# Patient Record
Sex: Female | Born: 1960 | Race: White | Hispanic: Yes | Marital: Single | Smoking: Never smoker
Health system: Southern US, Community
[De-identification: ages and names within clinical notes are randomized; demographics above are authoritative.]

## PROBLEM LIST (undated history)

## (undated) ENCOUNTER — Emergency Department (HOSPITAL_COMMUNITY): Disposition: A | Discharge status: Home / Self Care | Payer: Self-pay

## (undated) DIAGNOSIS — E042 Nontoxic multinodular goiter: Secondary | ICD-10-CM

## (undated) DIAGNOSIS — C9590 Leukemia, unspecified not having achieved remission: Secondary | ICD-10-CM

## (undated) DIAGNOSIS — E785 Hyperlipidemia, unspecified: Secondary | ICD-10-CM

## (undated) DIAGNOSIS — C9110 Chronic lymphocytic leukemia of B-cell type not having achieved remission: Secondary | ICD-10-CM

## (undated) DIAGNOSIS — I10 Essential (primary) hypertension: Secondary | ICD-10-CM

## (undated) HISTORY — DX: Nontoxic multinodular goiter: E04.2

## (undated) HISTORY — DX: Chronic lymphocytic leukemia of B-cell type not having achieved remission: C91.10

## (undated) HISTORY — DX: Hyperlipidemia, unspecified: E78.5

## (undated) HISTORY — DX: Essential (primary) hypertension: I10

---

## 2008-01-29 ENCOUNTER — Emergency Department (HOSPITAL_COMMUNITY): Admission: EM | Admit: 2008-01-29 | Discharge: 2008-01-29 | Payer: Self-pay | Admitting: Emergency Medicine

## 2010-09-09 ENCOUNTER — Inpatient Hospital Stay (HOSPITAL_COMMUNITY): Admission: EM | Admit: 2010-09-09 | Discharge: 2010-09-14 | Payer: Self-pay | Admitting: Emergency Medicine

## 2010-09-10 ENCOUNTER — Ambulatory Visit: Payer: Self-pay | Admitting: Hematology & Oncology

## 2010-09-12 ENCOUNTER — Encounter (INDEPENDENT_AMBULATORY_CARE_PROVIDER_SITE_OTHER): Payer: Self-pay | Admitting: Internal Medicine

## 2010-09-14 ENCOUNTER — Ambulatory Visit: Payer: Self-pay | Admitting: Oncology

## 2010-09-27 ENCOUNTER — Encounter: Payer: Self-pay | Admitting: Cardiology

## 2010-09-30 ENCOUNTER — Other Ambulatory Visit
Admission: RE | Admit: 2010-09-30 | Discharge: 2010-09-30 | Payer: Self-pay | Source: Home / Self Care | Admitting: Oncology

## 2010-09-30 LAB — MORPHOLOGY: PLT EST: ADEQUATE

## 2010-09-30 LAB — CBC WITH DIFFERENTIAL/PLATELET
Basophils Absolute: 0.1 10*3/uL (ref 0.0–0.1)
EOS%: 1 % (ref 0.0–7.0)
HCT: 35.9 % (ref 34.8–46.6)
HGB: 12.2 g/dL (ref 11.6–15.9)
MCH: 27.7 pg (ref 25.1–34.0)
MONO#: 0.4 10*3/uL (ref 0.1–0.9)
NEUT#: 3.7 10*3/uL (ref 1.5–6.5)
NEUT%: 24.7 % — ABNORMAL LOW (ref 38.4–76.8)
RDW: 13.6 % (ref 11.2–14.5)
WBC: 15.1 10*3/uL — ABNORMAL HIGH (ref 3.9–10.3)
lymph#: 10.7 10*3/uL — ABNORMAL HIGH (ref 0.9–3.3)

## 2010-09-30 LAB — COMPREHENSIVE METABOLIC PANEL
ALT: 10 U/L (ref 0–35)
Alkaline Phosphatase: 102 U/L (ref 39–117)
CO2: 25 mEq/L (ref 19–32)
Creatinine, Ser: 0.96 mg/dL (ref 0.40–1.20)
Glucose, Bld: 88 mg/dL (ref 70–99)
Sodium: 139 mEq/L (ref 135–145)
Total Bilirubin: 0.4 mg/dL (ref 0.3–1.2)

## 2010-09-30 LAB — CHCC SMEAR

## 2010-09-30 LAB — URIC ACID: Uric Acid, Serum: 6.6 mg/dL (ref 2.4–7.0)

## 2010-09-30 LAB — LACTATE DEHYDROGENASE: LDH: 215 U/L (ref 94–250)

## 2010-10-04 LAB — IMMUNOFIXATION ELECTROPHORESIS
IgA: 158 mg/dL (ref 68–378)
IgM, Serum: 21 mg/dL — ABNORMAL LOW (ref 60–263)
Total Protein, Serum Electrophoresis: 7.3 g/dL (ref 6.0–8.3)

## 2010-10-14 ENCOUNTER — Ambulatory Visit: Payer: Self-pay | Admitting: Oncology

## 2010-10-17 LAB — CBC WITH DIFFERENTIAL/PLATELET
BASO%: 0.3 % (ref 0.0–2.0)
EOS%: 0.9 % (ref 0.0–7.0)
MCH: 27.5 pg (ref 25.1–34.0)
MCHC: 33.7 g/dL (ref 31.5–36.0)
MCV: 81.5 fL (ref 79.5–101.0)
MONO%: 8.3 % (ref 0.0–14.0)
RBC: 4.61 10*6/uL (ref 3.70–5.45)
RDW: 13.2 % (ref 11.2–14.5)
lymph#: 12.5 10*3/uL — ABNORMAL HIGH (ref 0.9–3.3)

## 2010-10-30 HISTORY — PX: CARDIAC CATHETERIZATION: SHX172

## 2010-11-01 ENCOUNTER — Ambulatory Visit (HOSPITAL_COMMUNITY)
Admission: RE | Admit: 2010-11-01 | Discharge: 2010-11-01 | Payer: Self-pay | Source: Home / Self Care | Attending: Oncology | Admitting: Oncology

## 2010-11-01 LAB — CBC WITH DIFFERENTIAL/PLATELET
BASO%: 0.4 % (ref 0.0–2.0)
Basophils Absolute: 0 10*3/uL (ref 0.0–0.1)
EOS%: 1.6 % (ref 0.0–7.0)
Eosinophils Absolute: 0.2 10*3/uL (ref 0.0–0.5)
HCT: 36.5 % (ref 34.8–46.6)
HGB: 12.7 g/dL (ref 11.6–15.9)
LYMPH%: 60.4 % — ABNORMAL HIGH (ref 14.0–49.7)
MCH: 28.2 pg (ref 25.1–34.0)
MCHC: 34.8 g/dL (ref 31.5–36.0)
MCV: 81.2 fL (ref 79.5–101.0)
MONO#: 1.2 10*3/uL — ABNORMAL HIGH (ref 0.1–0.9)
MONO%: 10 % (ref 0.0–14.0)
NEUT#: 3.4 10*3/uL (ref 1.5–6.5)
NEUT%: 27.6 % — ABNORMAL LOW (ref 38.4–76.8)
Platelets: 196 10*3/uL (ref 145–400)
RBC: 4.5 10*6/uL (ref 3.70–5.45)
RDW: 13.6 % (ref 11.2–14.5)
WBC: 12.3 10*3/uL — ABNORMAL HIGH (ref 3.9–10.3)
lymph#: 7.4 10*3/uL — ABNORMAL HIGH (ref 0.9–3.3)

## 2010-11-01 LAB — COMPREHENSIVE METABOLIC PANEL
ALT: 17 U/L (ref 0–35)
AST: 22 U/L (ref 0–37)
Albumin: 3.6 g/dL (ref 3.5–5.2)
Alkaline Phosphatase: 101 U/L (ref 39–117)
BUN: 12 mg/dL (ref 6–23)
CO2: 27 mEq/L (ref 19–32)
Calcium: 8.8 mg/dL (ref 8.4–10.5)
Chloride: 105 mEq/L (ref 96–112)
Creatinine, Ser: 1.05 mg/dL (ref 0.40–1.20)
Glucose, Bld: 107 mg/dL — ABNORMAL HIGH (ref 70–99)
Potassium: 3.8 mEq/L (ref 3.5–5.3)
Sodium: 140 mEq/L (ref 135–145)
Total Bilirubin: 0.4 mg/dL (ref 0.3–1.2)
Total Protein: 7.4 g/dL (ref 6.0–8.3)

## 2010-11-01 LAB — LACTATE DEHYDROGENASE: LDH: 282 U/L — ABNORMAL HIGH (ref 94–250)

## 2010-11-11 ENCOUNTER — Telehealth (INDEPENDENT_AMBULATORY_CARE_PROVIDER_SITE_OTHER): Payer: Self-pay | Admitting: *Deleted

## 2010-11-23 ENCOUNTER — Ambulatory Visit
Admission: RE | Admit: 2010-11-23 | Discharge: 2010-11-23 | Payer: Self-pay | Source: Home / Self Care | Attending: Cardiology | Admitting: Cardiology

## 2010-11-23 ENCOUNTER — Encounter: Payer: Self-pay | Admitting: Cardiology

## 2010-11-23 DIAGNOSIS — E785 Hyperlipidemia, unspecified: Secondary | ICD-10-CM | POA: Insufficient documentation

## 2010-11-23 DIAGNOSIS — I5022 Chronic systolic (congestive) heart failure: Secondary | ICD-10-CM | POA: Insufficient documentation

## 2010-11-24 ENCOUNTER — Encounter: Payer: Self-pay | Admitting: Cardiology

## 2010-11-24 ENCOUNTER — Ambulatory Visit
Admission: RE | Admit: 2010-11-24 | Discharge: 2010-11-24 | Payer: Self-pay | Source: Home / Self Care | Attending: Cardiology | Admitting: Cardiology

## 2010-11-24 ENCOUNTER — Other Ambulatory Visit: Payer: Self-pay | Admitting: Cardiology

## 2010-11-24 LAB — HEPATIC FUNCTION PANEL
ALT: 19 U/L (ref 0–35)
AST: 21 U/L (ref 0–37)
Bilirubin, Direct: 0 mg/dL (ref 0.0–0.3)
Total Protein: 7.3 g/dL (ref 6.0–8.3)

## 2010-11-24 LAB — BRAIN NATRIURETIC PEPTIDE: Pro B Natriuretic peptide (BNP): 26.5 pg/mL (ref 0.0–100.0)

## 2010-11-24 LAB — BASIC METABOLIC PANEL
BUN: 12 mg/dL (ref 6–23)
Calcium: 9.1 mg/dL (ref 8.4–10.5)
GFR: 49.84 mL/min — ABNORMAL LOW (ref >60.00)
Glucose, Bld: 98 mg/dL (ref 70–99)
Potassium: 4.3 mEq/L (ref 3.5–5.1)

## 2010-11-24 LAB — LIPID PANEL
Cholesterol: 152 mg/dL (ref 0–200)
HDL: 37.6 mg/dL — ABNORMAL LOW (ref >39.00)
VLDL: 16.2 mg/dL (ref 0.0–40.0)

## 2010-11-25 LAB — CONVERTED CEMR LAB
Basophils Absolute: 0 10*3/uL (ref 0.0–0.1)
Basophils Relative: 0 % (ref 0–1)
Hemoglobin: 12.8 g/dL (ref 12.0–15.0)
Lymphocytes Relative: 67 % — ABNORMAL HIGH (ref 12–46)
MCHC: 32.5 g/dL (ref 30.0–36.0)
Neutro Abs: 3.1 10*3/uL (ref 1.7–7.7)
Neutrophils Relative %: 19 % — ABNORMAL LOW (ref 43–77)
Platelets: 232 10*3/uL (ref 150–400)
RDW: 13.7 % (ref 11.5–15.5)

## 2010-11-28 ENCOUNTER — Ambulatory Visit (HOSPITAL_COMMUNITY)
Admission: RE | Admit: 2010-11-28 | Discharge: 2010-11-28 | Payer: Self-pay | Source: Home / Self Care | Attending: Cardiology | Admitting: Cardiology

## 2010-11-28 LAB — CBC
HCT: 38.9 % (ref 36.0–46.0)
MCH: 27 pg (ref 26.0–34.0)
MCHC: 33.4 g/dL (ref 30.0–36.0)
MCV: 80.7 fL (ref 78.0–100.0)
Platelets: 187 10*3/uL (ref 150–400)
RDW: 13 % (ref 11.5–15.5)
WBC: 15.1 10*3/uL — ABNORMAL HIGH (ref 4.0–10.5)

## 2010-12-01 ENCOUNTER — Other Ambulatory Visit: Payer: Self-pay

## 2010-12-01 NOTE — Letter (Signed)
Summary: Cardiac Catheterization Instructions- Main Lab  [employer], Main Office  [address]   [place], [place] [address]   Phone: [phone]  Fax: [phone]     11/23/2010 MRN: [id number]  [facility] [address] [place], [place]  [address]  Dear Ms. Renshaw,   You are scheduled for Cardiac Catheterization on Monday January 30,2012              with Dr. Marca Ancona.  Please arrive at the [facility] at 11:30       a.m. on the day of your procedure.  1. DIET     __x__ Nothing to eat after midnight. You can have clear liquids until 7:30am, then nothing to eat or drink except your medications with a sip of water.   2. Come to the [place] office on Thursday January 26,2012            for lab work.  The lab at [facility] is open from 8:30 a.m. to 1:30 p.m. and 2:30 p.m. to 5:00 p.m. You should not have anything to eat or drink after midnight tonight.  3. MAKE SURE YOU TAKE YOUR ASPIRIN.  4. You may take all your medications.   5. Plan for one night stay - bring personal belongings (i.e. toothpaste, toothbrush, etc.)  6. Bring a current list of your medications and current insurance cards.  7. Must have a responsible person to drive you home.   8. Someone must be with you  for the first 24 hours after you arrive home.  9. Please wear clothes that are easy to get on and off and wear slip-on shoes.  *Special note: Every effort is made to have your procedure done on time.  Occasionally there are emergencies that present themselves at the hospital that may cause delays.  Please be patient if a delay does occur.  If you have any questions after you get home, please call the office at the number listed above.  Katina Dung, RN, BSN

## 2010-12-01 NOTE — Miscellaneous (Signed)
Summary: Orders Update  Clinical Lists Changes  Problems: Added new problem of PRE-OPERATIVE CARDIOVASCULAR EXAMINATION (ICD-V72.81) Orders: Added new Test order of TLB-Lipid Panel (80061-LIPID) - Signed Added new Test order of TLB-Hepatic/Liver Function Pnl (80076-HEPATIC) - Signed Added new Test order of TLB-BMP (Basic Metabolic Panel-BMET) (80048-METABOL) - Signed Added new Test order of TLB-CBC Platelet - w/Differential (85025-CBCD) - Signed Added new Test order of TLB-BNP (B-Natriuretic Peptide) (83880-BNPR) - Signed Added new Test order of TLB-PT (Protime) (85610-PTP) - Signed

## 2010-12-01 NOTE — Assessment & Plan Note (Signed)
Summary: continue  care self referral. Patient dissatisfied with Dr. Rexene Edison...   Visit Type:  Initial Consult Primary Provider:  [facility]  CC:  headache, dizziness, blurred vision, head feels heated, and sour taste in mouth.  History of Present Illness: 50 yo with HTN, CLL, and recently diagnosed cardiomyopathy presents to establish cardiology care.  Patient was hospitalized in 11/11 with "atypical pneumonia" and a headache.  She was found to have probable CLL and echo showed a diffusely hypokinetic left ventricle with EF abougt 25-30%.  HIV and ANA were negative.  She has been treated medically since that time with losartan, Coreg, and spironolactone.  She says that her mother "dropped dead" at age 5.  She is a non-smoker.   Patient reports dyspnea walking up stairs or carrying groceries for over a year.  Probably some orthopnea (sleeps on 3 pillows).  No PND, no history of chest pain.  No syncope or lightheadedness.  No tachypalpitations.    ECG: NSR, LVH, deep anterolateral T wave inversions (repolarization abnormality versus ischemia)  Labs (11/11): HIV negative, ANA negative, K 3.9, creatinine 1.12, BNP 135  Preventive Screening-Counseling & Management  Alcohol-Tobacco     Smoking Status: never  Caffeine-Diet-Exercise     Does Patient Exercise: no  Current Medications (verified): 1)  Aldactone 25 Mg Tabs (Spironolactone) .... Take 1 Tablet By Mouth Two Times A Day 2)  Simvastatin 20 Mg Tabs (Simvastatin) .Marland Kitchen.. 1 Tablet Every Pm 3)  Carvedilol 6.25 Mg Tabs (Carvedilol) .Marland Kitchen.. 1 Tablet in The Am and 1 Tablet At Bedtime 4)  Aspirin 81 Mg Tbec (Aspirin) .... Take 1 Tablet By Mouth Once A Day 5)  Levothroid 25 Mcg Tabs (Levothyroxine Sodium) .Marland Kitchen.. 1 Tablet Before Breakfast 6)  Cozaar 50 Mg Tabs (Losartan Potassium) .... Take 1 Tablet By Mouth Once A Day  Allergies (verified): No Known Drug Allergies  Past History:  Past Medical History: 1. Chronic lymphocytic leukemia:  followed by Dr. Arline Asp 2. Hyperlipidemia 3. Hypertension  4. Multinodular goiter with hypothyroidism 5. Cardiomyopathy: Echo (11/11) EF 25-30% with diffuse hypokinesis, mild left atrial enlargement.  11/11 HIV and ANA negative.   Family History: Father: Deceased 27 Mother: says she died of MI.  Sounds like sudden cardiac death at age 33  Social History: From [place] lives in [place].  Married.  Speaks minimal English.  Works in [employer].  Tobacco Use - No.  Alcohol Use - no Regular Exercise - no Smoking Status:  never Does Patient Exercise:  no  Review of Systems       All systems reviewed and negative except as per HPI.   Vital Signs:  Patient profile:   50 year old female Height:      61 inches Weight:      191 pounds BMI:     36.22 Pulse rate:   69 / minute BP sitting:   124 / 80  (left arm)  Vitals Entered By: Celestia Khat, CMA (November 23, 2010 4:37 PM)  Physical Exam  General:  Well developed, well nourished, in no acute distress. Head:  normocephalic and atraumatic Nose:  no deformity, discharge, inflammation, or lesions Mouth:  Teeth, gums and palate normal. Oral mucosa normal. Neck:  Neck supple, no JVD. No masses, thyromegaly or abnormal cervical nodes. Lungs:  Clear bilaterally to auscultation and percussion. Heart:  Non-displaced PMI, chest non-tender; regular rate and rhythm, S1, S2 without rubs or gallops. 1/6 SEM RUSB.  Carotid upstroke normal, no bruit.  Pedals normal pulses.  Trace ankle edema.  Abdomen:  Bowel sounds positive; abdomen soft and non-tender without masses, organomegaly, or hernias noted. No hepatosplenomegaly. Msk:  Back normal, normal gait. Muscle strength and tone normal. Extremities:  No clubbing or cyanosis. Neurologic:  Alert and oriented x 3. Skin:  Intact without lesions or rashes. Psych:  Normal affect.   Impression & Recommendations:  Problem # 1:  CHRONIC SYSTOLIC HEART FAILURE (ICD-428.22) Cardiomyopathy of  uncertain etiology with EF 25-30%.  HIV and ANA negative.  It sounds like the patient's mother had sudden cardiac death at age 18.  Differential diagnosis includes CAD, familial cardiomyopathy, viral myocarditis.  Less likely infitrative disease like sarcoidosis.  - LHC to rule out coronary disease (HTN, hyperlipidemia, mother with SCD at age 87).  - SPEP/UPEP, Fe, TIBC, ferritin, BNP - If cath unrevealing, will consider cardiac MRI.  - Continue Coreg and spironolactone.  Increase losartan to 50 mg two times a day with BMET in a week.   Problem # 2:  HYPERLIPIDEMIA-MIXED (ICD-272.4) Check lipids/LFTs.   Other Orders: Cardiac Catheterization (Cardiac Cath)  Patient Instructions: 1)  Your physician has recommended you make the following change in your medication:  2)  Increase Losartan to 50mg  twice a day. 3)  Your physician recommends that you return for lab work tomorrow, Thursday January 26,2012---BMP/BNP/CBC/PT/Lipid/Liver--428.22--you should be fasting. Please come after 9AM in the morning. 4)  Your physician recommends that you schedule a follow-up appointment on Friday February  24,2012  at 4:15pm   with Dr Shirlee Latch in the [place] office. She should have a [place] interpreter.  5)  [address] [facility] [address])  [address] 7)  [place] 8)  [phone] 9)  Your physician has requested that you have a cardiac catheterization.  Cardiac catheterization is used to diagnose and/or treat various heart conditions. Doctors may recommend this procedure for a number of different reasons. The most common reason is to evaluate chest pain. Chest pain can be a symptom of coronary artery disease (CAD), and cardiac catheterization can show whether plaque is narrowing or blocking your heart's arteries. This procedure is also used to evaluate the valves, as well as measure the blood flow and oxygen levels in different parts of your heart.  For further information please visit [email or web address]  Please  follow instruction sheet, as given. Monday January 30,2011 Prescriptions: LOSARTAN POTASSIUM 50 MG TABS (LOSARTAN POTASSIUM) one twice a day  #60 x 6   Entered by:   Katina Dung, RN, BSN   Authorized by:   Marca Ancona, MD   Signed by:   Katina Dung, RN, BSN on 11/23/2010   Method used:   Print then Give to Patient   RxID:   [id number]

## 2010-12-01 NOTE — Progress Notes (Signed)
  ROI faxed to Dr.Harwani's Office to received records for Np appt with Katherine Terrell on 11/23/10 [facility]  November 11, 2010 9:08 AM     Appended Document:  Records Received from [facility] Office, gave to [place]

## 2010-12-02 ENCOUNTER — Encounter: Payer: Self-pay | Admitting: Cardiology

## 2010-12-03 ENCOUNTER — Telehealth: Payer: Self-pay | Admitting: Physician Assistant

## 2010-12-06 ENCOUNTER — Telehealth: Payer: Self-pay | Admitting: Cardiology

## 2010-12-06 ENCOUNTER — Encounter: Payer: Self-pay | Admitting: Cardiology

## 2010-12-07 NOTE — Letter (Signed)
Summary: Custom - Lipid  [facility], Main Office  [address]   [place], [place] [address]   Phone: [phone]  Fax: [phone]     December 02, 2010 MRN: [id number]   [facility] [address] [place], [place]  [address]   Dear Ms. Warnke,  Dr Shirlee Latch has  reviewed your cholesterol results.  They are as follows:     Total Cholesterol:    152 (Desirable: less than 200)       HDL  Cholesterol:     37.60  (Desirable: greater than 40 for men and 50 for women)       LDL Cholesterol:       98  (Desirable: less than 100 for low risk and less than 70 for moderate to high risk)       Triglycerides:       81.0  (Desirable: less than 150)  His recommendations include: no new recommendations. Your cholesterol is OK.   Call our office at the number listed above if you have any questions.  Lowering your LDL cholesterol is important, but it is only one of a large number of "risk factors" that may indicate that you are at risk for heart disease, stroke or other complications of hardening of the arteries.  Other risk factors include:   A.  Cigarette Smoking* B.  High Blood Pressure* C.  Obesity* D.   Low HDL Cholesterol (see yours above)* E.   Diabetes Mellitus (higher risk if your is uncontrolled) F.  Family history of premature heart disease G.  Previous history of stroke or cardiovascular disease    *These are risk factors YOU HAVE CONTROL OVER.  For more information, visit .  There is now evidence that lowering the TOTAL CHOLESTEROL AND LDL CHOLESTEROL can reduce the risk of heart disease.  The American Heart Association recommends the following guidelines for the treatment of elevated cholesterol:  1.  If there is now current heart disease and less than two risk factors, TOTAL CHOLESTEROL should be less than 200 and LDL CHOLESTEROL should be less than 100. 2.  If there is current heart disease or two or more risk factors, TOTAL CHOLESTEROL should be less  than 200 and LDL CHOLESTEROL should be less than 70.  A diet low in cholesterol, saturated fat, and calories is the cornerstone of treatment for elevated cholesterol.  Cessation of smoking and exercise are also important in the management of elevated cholesterol and preventing vascular disease.  Studies have shown that 30 to 60 minutes of physical activity most days can help lower blood pressure, lower cholesterol, and keep your weight at a healthy level.  Drug therapy is used when cholesterol levels do not respond to therapeutic lifestyle changes (smoking cessation, diet, and exercise) and remains unacceptably high.  If medication is started, it is important to have you levels checked periodically to evaluate the need for further treatment options.  Thank you,    Luana Shu

## 2010-12-07 NOTE — Progress Notes (Addendum)
  Phone Note Call from Patient   Summary of Call: pt's son called this AM (pt does not speak [place]). he reports that she is c/o leg pain and fever. there is no obvious bleeding. she had a cardiac cath on 1/30.   I advised pt's son to take her to nearest walkin clinic, or ED, for further evaluation (they live in [place]). he was agreeable with this recommendation. Initial call taken by: Nelida Meuse, PA-C,  December 03, 2010 1:28 PM

## 2010-12-08 NOTE — Procedures (Signed)
  NAMESHIVALI, Katherine Terrell               ACCOUNT NO.:  [id number]  MEDICAL RECORD NO.:  [id number]          PATIENT TYPE:  OIB  LOCATION:  2899                         FACILITY:  [facility]  PHYSICIAN:  Marca Ancona, MD      DATE OF BIRTH:  August 16, 1961  DATE OF PROCEDURE:  11/28/2010 DATE OF DISCHARGE:  11/28/2010                           CARDIAC CATHETERIZATION   PROCEDURES: 1. Left heart catheterization. 2. Coronary angiography. 3. Left ventriculography.  OPERATOR:  Marca Ancona, MD  INDICATIONS:  This is a 50 year old who was found to have a cardiomyopathy with an EF of 25-30% on echocardiogram in November. Cardiomyopathy was of uncertain etiology.  PROCEDURE NOTE:  After informed consent was obtained, the patient underwent Allen testing on her right wrist.  The Allen test was negative suggesting poor collateral circulation from the ulnar artery to the radial side of the hand, therefore we elected to proceed using femoral artery access.  The right coronary was sterilely prepped and draped. Lidocaine 1% was used locally to anesthetize the right groin area.  The right common femoral artery was accessed using modified Seldinger technique and a 5-French arterial sheath was placed.  The left coronary artery and the right coronary artery were engaged using the multipurpose catheter.  The left ventricle was entered using the multipurpose catheter.  There were no complications.  FINDINGS: 1. Hemodynamics:  LV 138/2, aorta 142/73. 2. Left ventriculography:  EF was estimated to be 50-55%. 3. Right coronary artery:  The right coronary artery was a dominant     vessel with no angiographic coronary artery disease. 4. Left main:  Left main was short vessel with no angiographic     coronary artery disease. 5. Left circumflex system:  The left circumflex system had a first     obtuse marginal, small second obtuse marginal, and third obtuse     marginal.  There was no angiographic disease. 6.  LAD system:  The LAD had two moderate-sized diagonals followed by     some smaller diagonals more distally.  There was no angiographic     disease.  IMPRESSION:  This is a 50 year old with cardiomyopathy of uncertain etiology who was found to have normal coronaries on left heart catheterization.  Her ejection fraction actually appears improved compared to November.  I am going to go ahead and repeat an echocardiogram in 3-4 weeks to confirm this.     Marca Ancona, MD     DM/MEDQ  D:  11/28/2010  T:  11/29/2010  Job:  [id number]  cc:   [facility]  Electronically Signed by Marca Ancona MD on 12/08/2010 08:38:35 AM

## 2010-12-15 NOTE — Letter (Signed)
Summary: Return To Work  [employer], Main Office  [address]   [place], [place] [address]   Phone: [phone]  Fax: [phone]    12/06/2010  TO: Leodis Sias IT MAY CONCERN   RE: WILLOW SHIDLER [address] [place]   The above named individual is under my medical care and may return to work on: February 14,2012 without restrictions.  If you have any further questions or need additional information, please call.     Sincerely,      Dalton McLean,MD

## 2010-12-15 NOTE — Progress Notes (Signed)
Summary: Need letter to return to work  Phone Note Call from Patient Call back at [employer] [phone]   Caller: Patient Reason for Call: Talk to Nurse Summary of Call: pt states her employer is asking for a new letter from the doctor to go back to work on 12/13/10. pt states she needs the letter to state she can go back to work and pt has no weight limit to carry. pt would like the letter to be mail to her home.  pt does not speak english. pt would like to know when the letter will be sent to her. Initial call taken by: Judie Grieve,  December 06, 2010 11:10 AM  Follow-up for Phone Call        pt notified through interpreter # [phone] that I would mail letter to return to work to her home address

## 2010-12-20 ENCOUNTER — Other Ambulatory Visit (INDEPENDENT_AMBULATORY_CARE_PROVIDER_SITE_OTHER): Payer: Self-pay

## 2010-12-20 ENCOUNTER — Other Ambulatory Visit: Payer: Self-pay | Admitting: Cardiology

## 2010-12-20 DIAGNOSIS — I059 Rheumatic mitral valve disease, unspecified: Secondary | ICD-10-CM

## 2010-12-23 ENCOUNTER — Ambulatory Visit (INDEPENDENT_AMBULATORY_CARE_PROVIDER_SITE_OTHER): Payer: Self-pay | Admitting: Cardiology

## 2010-12-23 ENCOUNTER — Encounter: Payer: Self-pay | Admitting: Cardiology

## 2010-12-23 DIAGNOSIS — I5022 Chronic systolic (congestive) heart failure: Secondary | ICD-10-CM

## 2010-12-23 DIAGNOSIS — E039 Hypothyroidism, unspecified: Secondary | ICD-10-CM | POA: Insufficient documentation

## 2010-12-26 LAB — CONVERTED CEMR LAB: TSH: 2.928 microintl units/mL (ref 0.350–4.500)

## 2011-01-04 ENCOUNTER — Encounter (HOSPITAL_BASED_OUTPATIENT_CLINIC_OR_DEPARTMENT_OTHER): Payer: Self-pay | Admitting: Oncology

## 2011-01-04 ENCOUNTER — Other Ambulatory Visit (HOSPITAL_COMMUNITY): Payer: Self-pay | Admitting: Oncology

## 2011-01-04 DIAGNOSIS — Z23 Encounter for immunization: Secondary | ICD-10-CM

## 2011-01-04 DIAGNOSIS — C9110 Chronic lymphocytic leukemia of B-cell type not having achieved remission: Secondary | ICD-10-CM

## 2011-01-04 DIAGNOSIS — D72829 Elevated white blood cell count, unspecified: Secondary | ICD-10-CM

## 2011-01-04 DIAGNOSIS — Z1231 Encounter for screening mammogram for malignant neoplasm of breast: Secondary | ICD-10-CM

## 2011-01-04 LAB — COMPREHENSIVE METABOLIC PANEL
ALT: 16 U/L (ref 0–35)
AST: 18 U/L (ref 0–37)
Calcium: 9 mg/dL (ref 8.4–10.5)
Chloride: 105 mEq/L (ref 96–112)
Creatinine, Ser: 1.08 mg/dL (ref 0.40–1.20)
Sodium: 139 mEq/L (ref 135–145)

## 2011-01-04 LAB — CBC WITH DIFFERENTIAL/PLATELET
BASO%: 0.4 % (ref 0.0–2.0)
LYMPH%: 69.9 % — ABNORMAL HIGH (ref 14.0–49.7)
MCHC: 34 g/dL (ref 31.5–36.0)
MONO#: 1 10*3/uL — ABNORMAL HIGH (ref 0.1–0.9)
Platelets: 182 10*3/uL (ref 145–400)
RBC: 4.36 10*6/uL (ref 3.70–5.45)
RDW: 14.5 % (ref 11.2–14.5)
WBC: 17.4 10*3/uL — ABNORMAL HIGH (ref 3.9–10.3)
lymph#: 12.2 10*3/uL — ABNORMAL HIGH (ref 0.9–3.3)

## 2011-01-04 LAB — LACTATE DEHYDROGENASE: LDH: 188 U/L (ref 94–250)

## 2011-01-05 NOTE — Assessment & Plan Note (Signed)
Summary: S/P CATH/ /PT SPEAKS SPANISH/WILL HAVE INTERPERTER/SET UP WIT.Marland KitchenMarland Kitchen   Visit Type:  Follow-up Primary Provider:  [facility]  CC:  F/U [facility] s/p cardiac cath..  History of Present Illness: 50 yo with HTN, CLL, and recently diagnosed cardiomyopathy presents for followup.  Patient was hospitalized in 11/11 with "atypical pneumonia" and a headache.  She was found to have probable CLL and echo showed a diffusely hypokinetic left ventricle with EF abougt 25-30%.  HIV, TSH, and ANA were negative.  She has been treated medically since that time with losartan, Coreg, and spironolactone. She says that her mother "dropped dead" at age 88.  She is a non-smoker.   I did a left heart cath in 1/12 to assess for coronary disease.  This showed no angiographic CAD. Left ventriculogram with cath and echo in 2/12 showed that EF had improved to 50-55%.  Minimal dyspnea now.  She able to climb steps and do housework with no problem.  No PND, no history of chest pain.  No syncope or lightheadedness.  No tachypalpitations.    ECG: NSR, diffuse anterior/lateral/inferior T wave inversions  Labs (11/11): HIV negative, ANA negative, K 3.9, creatinine 1.12, BNP 135 Labs (1/12): LDL 98, HDL 38, BNP 27, K 4.3, creatinine 1.2 Labs (2/12): TSH normal  Current Medications (verified): 1)  Aldactone 25 Mg Tabs (Spironolactone) .... Take 1 Tablet By Mouth Two Times A Day 2)  Simvastatin 20 Mg Tabs (Simvastatin) .Marland Kitchen.. 1 Tablet Every Pm 3)  Carvedilol 6.25 Mg Tabs (Carvedilol) .Marland Kitchen.. 1 Tablet in The Am and 1 Tablet At Bedtime 4)  Levothroid 25 Mcg Tabs (Levothyroxine Sodium) .Marland Kitchen.. 1 Tablet Before Breakfast 5)  Losartan Potassium 50 Mg Tabs (Losartan Potassium) .... One Twice A Day  Allergies (verified): No Known Drug Allergies  Past History:  Family History: Last updated: 09-Dec-2010 Father: Deceased 62 Mother: says she died of MI.  Sounds like sudden cardiac death at age 81  Social History: Last  updated: 12/09/10 From [place] lives in [place].  Married.  Speaks minimal English.  Works in [employer].  Tobacco Use - No.  Alcohol Use - no Regular Exercise - no  Risk Factors: Exercise: no (12-09-2010)  Risk Factors: Smoking Status: never (Dec 09, 2010)  Past Medical History: 1. Chronic lymphocytic leukemia: followed by Dr. Arline Asp 2. Hyperlipidemia 3. Hypertension  4. Multinodular goiter with hypothyroidism 5. Cardiomyopathy: Echo (11/11) EF 25-30% with diffuse hypokinesis, mild left atrial enlargement.  11/11 HIV and ANA negative.  TSH normal.  LHC (1/12): EF 50-55%, no angiographic CAD.  Echo (2/12): EF 50-55%, moderate LV hypertrophy, grade I diastolic dysfunction, mild MR, normal RV.   Past Surgical History: cardiac cath @ [facility] 2012  Family History: Reviewed history from 12-09-2010 and no changes required. Father: Deceased 75 Mother: says she died of MI.  Sounds like sudden cardiac death at age 66  Social History: Reviewed history from Dec 09, 2010 and no changes required. From [place] lives in [place].  Married.  Speaks minimal English.  Works in [employer].  Tobacco Use - No.  Alcohol Use - no Regular Exercise - no  Vital Signs:  Patient profile:   50 year old female Height:      61 inches Weight:      189 pounds BMI:     35.84 Pulse rate:   68 / minute BP sitting:   120 / 72  (left arm) Cuff size:   regular  Vitals Entered By: Bishop Dublin, CMA (December 23, 2010  4:18 PM)  Physical Exam  General:  Well developed, well nourished, in no acute distress.  Obese.  Neck:  Neck supple, no JVD. No masses, thyromegaly or abnormal cervical nodes. Lungs:  Clear bilaterally to auscultation and percussion. Heart:  Non-displaced PMI, chest non-tender; regular rate and rhythm, S1, S2 without rubs or gallops. 1/6 SEM RUSB.  Carotid upstroke normal, no bruit.  Pedals normal pulses. Trace ankle edema.  Abdomen:  Bowel sounds positive; abdomen soft and  non-tender without masses, organomegaly, or hernias noted. No hepatosplenomegaly. Extremities:  No clubbing or cyanosis. Neurologic:  Alert and oriented x 3. Psych:  Normal affect.   Impression & Recommendations:  Problem # 1:  CHRONIC SYSTOLIC HEART FAILURE (ICD-428.22) No angiographic CAD and close to normal EF with repeat echo and LV-gram.  This presentation may be consistent with an episode of myocarditis with recovery of LV function.  Would continue current meds for now.  Will need BMET in 2 months and followup in the office in 6 months to reassess.   Other Orders: T-TSH [phone])  Patient Instructions: 1)  Your physician recommends that you schedule a follow-up appointment in: 6 MONTHS 2)  Your physician recommends that you return for lab work in: 2 MONTHS (BMP) 3)  Your physician recommends that you continue on your current medications as directed. Please refer to the Current Medication list given to you today.

## 2011-01-05 NOTE — Progress Notes (Signed)
Summary: [facility] Progress Note   [facility] Progress Note   Imported By: Roderic Ovens 12/30/2010 09:41:26  _____________________________________________________________________  External Attachment:    Type:   Image     Comment:   External Document

## 2011-01-10 ENCOUNTER — Ambulatory Visit (HOSPITAL_COMMUNITY)
Admission: RE | Admit: 2011-01-10 | Discharge: 2011-01-10 | Disposition: A | Discharge status: Home / Self Care | Payer: Self-pay | Source: Ambulatory Visit | Attending: Oncology | Admitting: Oncology

## 2011-01-10 DIAGNOSIS — Z1231 Encounter for screening mammogram for malignant neoplasm of breast: Secondary | ICD-10-CM | POA: Insufficient documentation

## 2011-01-10 LAB — BASIC METABOLIC PANEL
BUN: 11 mg/dL (ref 6–23)
BUN: 9 mg/dL (ref 6–23)
CO2: 26 mEq/L (ref 19–32)
CO2: 27 mEq/L (ref 19–32)
Calcium: 9.2 mg/dL (ref 8.4–10.5)
Chloride: 101 mEq/L (ref 96–112)
Chloride: 105 mEq/L (ref 96–112)
Chloride: 107 mEq/L (ref 96–112)
Creatinine, Ser: 0.85 mg/dL (ref 0.4–1.2)
Creatinine, Ser: 0.97 mg/dL (ref 0.4–1.2)
Creatinine, Ser: 1.12 mg/dL (ref 0.4–1.2)
GFR calc Af Amer: >60 mL/min (ref >60)
GFR calc non Af Amer: 52 mL/min — ABNORMAL LOW (ref >60)
Glucose, Bld: 108 mg/dL — ABNORMAL HIGH (ref 70–99)
Glucose, Bld: 94 mg/dL (ref 70–99)
Potassium: 3.9 mEq/L (ref 3.5–5.1)

## 2011-01-10 LAB — BRAIN NATRIURETIC PEPTIDE: Pro B Natriuretic peptide (BNP): 135 pg/mL — ABNORMAL HIGH (ref 0.0–100.0)

## 2011-01-10 LAB — DIFFERENTIAL
Basophils Absolute: 0 10*3/uL (ref 0.0–0.1)
Basophils Absolute: 0 10*3/uL (ref 0.0–0.1)
Basophils Relative: 0 % (ref 0–1)
Eosinophils Absolute: 0.2 10*3/uL (ref 0.0–0.7)
Eosinophils Relative: 1 % (ref 0–5)
Lymphocytes Relative: 71 % — ABNORMAL HIGH (ref 12–46)
Lymphocytes Relative: 76 % — ABNORMAL HIGH (ref 12–46)
Lymphs Abs: 9.4 10*3/uL — ABNORMAL HIGH (ref 0.7–4.0)
Monocytes Absolute: 1.1 10*3/uL — ABNORMAL HIGH (ref 0.1–1.0)
Monocytes Relative: 7 % (ref 3–12)
Monocytes Relative: 8 % (ref 3–12)
Neutro Abs: 3.3 10*3/uL (ref 1.7–7.7)
Neutrophils Relative %: 17 % — ABNORMAL LOW (ref 43–77)
Neutrophils Relative %: 21 % — ABNORMAL LOW (ref 43–77)

## 2011-01-10 LAB — RENAL FUNCTION PANEL
Calcium: 8.6 mg/dL (ref 8.4–10.5)
GFR calc Af Amer: >60 mL/min (ref >60)
GFR calc non Af Amer: >60 mL/min (ref >60)
Phosphorus: 3.4 mg/dL (ref 2.3–4.6)
Sodium: 140 mEq/L (ref 135–145)

## 2011-01-10 LAB — CBC
HCT: 35.1 % — ABNORMAL LOW (ref 36.0–46.0)
HCT: 35.5 % — ABNORMAL LOW (ref 36.0–46.0)
HCT: 37.8 % (ref 36.0–46.0)
Hemoglobin: 11.8 g/dL — ABNORMAL LOW (ref 12.0–15.0)
MCH: 26.4 pg (ref 26.0–34.0)
MCH: 26.6 pg (ref 26.0–34.0)
MCH: 27 pg (ref 26.0–34.0)
MCH: 27.2 pg (ref 26.0–34.0)
MCH: 27.6 pg (ref 26.0–34.0)
MCHC: 33.1 g/dL (ref 30.0–36.0)
MCHC: 33.2 g/dL (ref 30.0–36.0)
MCHC: 34 g/dL (ref 30.0–36.0)
MCHC: 34.4 g/dL (ref 30.0–36.0)
MCV: 79.2 fL (ref 78.0–100.0)
MCV: 79.6 fL (ref 78.0–100.0)
MCV: 79.8 fL (ref 78.0–100.0)
MCV: 80.1 fL (ref 78.0–100.0)
MCV: 80.7 fL (ref 78.0–100.0)
Platelets: 181 10*3/uL (ref 150–400)
Platelets: 184 10*3/uL (ref 150–400)
Platelets: 184 10*3/uL (ref 150–400)
Platelets: 195 10*3/uL (ref 150–400)
Platelets: 195 10*3/uL (ref 150–400)
RBC: 4.35 MIL/uL (ref 3.87–5.11)
RDW: 13.5 % (ref 11.5–15.5)
RDW: 13.9 % (ref 11.5–15.5)
WBC: 15.7 10*3/uL — ABNORMAL HIGH (ref 4.0–10.5)
WBC: 16.9 10*3/uL — ABNORMAL HIGH (ref 4.0–10.5)

## 2011-01-10 LAB — POCT I-STAT, CHEM 8
Glucose, Bld: 104 mg/dL — ABNORMAL HIGH (ref 70–99)
HCT: 40 % (ref 36.0–46.0)
Hemoglobin: 13.6 g/dL (ref 12.0–15.0)
Potassium: 3.5 mEq/L (ref 3.5–5.1)
Sodium: 142 mEq/L (ref 135–145)

## 2011-01-10 LAB — URINE MICROSCOPIC-ADD ON

## 2011-01-10 LAB — CULTURE, BLOOD (ROUTINE X 2)
Culture  Setup Time: 201111120521
Culture: NO GROWTH

## 2011-01-10 LAB — PROTEIN ELECTROPH W RFLX QUANT IMMUNOGLOBULINS
Alpha-1-Globulin: 4.1 % (ref 2.9–4.9)
Alpha-2-Globulin: 10.5 % (ref 7.1–11.8)
Beta 2: 4.5 % (ref 3.2–6.5)
Beta Globulin: 5.7 % (ref 4.7–7.2)
Gamma Globulin: 17.3 % (ref 11.1–18.8)

## 2011-01-10 LAB — PATHOLOGIST SMEAR REVIEW

## 2011-01-10 LAB — URINALYSIS, ROUTINE W REFLEX MICROSCOPIC
Glucose, UA: NEGATIVE mg/dL
Ketones, ur: NEGATIVE mg/dL
Leukocytes, UA: NEGATIVE
pH: 6.5 (ref 5.0–8.0)

## 2011-01-10 LAB — HIV ANTIBODY (ROUTINE TESTING W REFLEX): HIV: NONREACTIVE

## 2011-01-10 LAB — COMPREHENSIVE METABOLIC PANEL
BUN: 13 mg/dL (ref 6–23)
Calcium: 8.7 mg/dL (ref 8.4–10.5)
Glucose, Bld: 105 mg/dL — ABNORMAL HIGH (ref 70–99)
Sodium: 140 mEq/L (ref 135–145)
Total Protein: 6.6 g/dL (ref 6.0–8.3)

## 2011-01-10 LAB — DIRECT ANTIGLOBULIN TEST (NOT AT ARMC): DAT, complement: NEGATIVE

## 2011-01-10 LAB — C-REACTIVE PROTEIN: CRP: 0.1 mg/dL — ABNORMAL LOW (ref <0.6)

## 2011-01-10 LAB — SEDIMENTATION RATE: Sed Rate: 19 mm/hr (ref 0–22)

## 2011-02-24 ENCOUNTER — Other Ambulatory Visit (INDEPENDENT_AMBULATORY_CARE_PROVIDER_SITE_OTHER): Payer: Self-pay | Admitting: *Deleted

## 2011-02-24 DIAGNOSIS — Z79899 Other long term (current) drug therapy: Secondary | ICD-10-CM

## 2011-02-24 DIAGNOSIS — I509 Heart failure, unspecified: Secondary | ICD-10-CM

## 2011-02-24 LAB — BASIC METABOLIC PANEL
BUN: 22 mg/dL (ref 6–23)
Chloride: 104 mEq/L (ref 96–112)
Creat: 1.05 mg/dL (ref 0.40–1.20)

## 2011-03-06 ENCOUNTER — Encounter (HOSPITAL_BASED_OUTPATIENT_CLINIC_OR_DEPARTMENT_OTHER): Payer: Self-pay | Admitting: Oncology

## 2011-03-06 ENCOUNTER — Other Ambulatory Visit (HOSPITAL_COMMUNITY): Payer: Self-pay | Admitting: Oncology

## 2011-03-06 DIAGNOSIS — C9110 Chronic lymphocytic leukemia of B-cell type not having achieved remission: Secondary | ICD-10-CM

## 2011-03-06 DIAGNOSIS — D72829 Elevated white blood cell count, unspecified: Secondary | ICD-10-CM

## 2011-03-06 LAB — CBC WITH DIFFERENTIAL/PLATELET
HCT: 36.2 % (ref 34.8–46.6)
HGB: 12.3 g/dL (ref 11.6–15.9)
RDW: 13.1 % (ref 11.2–14.5)
WBC: 25.9 10*3/uL — ABNORMAL HIGH (ref 3.9–10.3)

## 2011-03-06 LAB — MANUAL DIFFERENTIAL
ALC: 21.7 10*3/uL — ABNORMAL HIGH (ref 0.9–3.3)
ANC (CHCC manual diff): 3.1 10*3/uL (ref 1.5–6.5)
Band Neutrophils: 0 % (ref 0–10)
Basophil: 0 % (ref 0–2)
Blasts: 0 % (ref 0–0)
LYMPH: 84 % — ABNORMAL HIGH (ref 14–49)
Other Cell: 0 % (ref 0–0)
PLT EST: ADEQUATE
PROMYELO: 0 % (ref 0–0)
RBC Comments: NORMAL
SEG: 12 % — ABNORMAL LOW (ref 38–77)
Variant Lymph: 0 % (ref 0–0)
nRBC: 0 % (ref 0–0)

## 2011-03-06 LAB — COMPREHENSIVE METABOLIC PANEL
ALT: 19 U/L (ref 0–35)
CO2: 26 mEq/L (ref 19–32)
Calcium: 9.1 mg/dL (ref 8.4–10.5)
Chloride: 105 mEq/L (ref 96–112)
Creatinine, Ser: 1.08 mg/dL (ref 0.40–1.20)
Glucose, Bld: 102 mg/dL — ABNORMAL HIGH (ref 70–99)

## 2011-03-06 LAB — LACTATE DEHYDROGENASE: LDH: 333 U/L — ABNORMAL HIGH (ref 94–250)

## 2011-03-07 LAB — IGG, IGA, IGM
IgG (Immunoglobin G), Serum: 1390 mg/dL (ref 700–1600)
IgM, Serum: 17 mg/dL — ABNORMAL LOW (ref 60–263)

## 2011-04-06 ENCOUNTER — Other Ambulatory Visit (HOSPITAL_COMMUNITY): Payer: Self-pay | Admitting: Oncology

## 2011-04-06 ENCOUNTER — Encounter (HOSPITAL_BASED_OUTPATIENT_CLINIC_OR_DEPARTMENT_OTHER): Payer: Self-pay | Admitting: Oncology

## 2011-04-06 DIAGNOSIS — C9110 Chronic lymphocytic leukemia of B-cell type not having achieved remission: Secondary | ICD-10-CM

## 2011-04-06 LAB — CBC WITH DIFFERENTIAL/PLATELET
BASO%: 0.2 % (ref 0.0–2.0)
EOS%: 0.6 % (ref 0.0–7.0)
HCT: 36 % (ref 34.8–46.6)
LYMPH%: 74.6 % — ABNORMAL HIGH (ref 14.0–49.7)
MCH: 27.9 pg (ref 25.1–34.0)
MCHC: 33.9 g/dL (ref 31.5–36.0)
NEUT%: 14.1 % — ABNORMAL LOW (ref 38.4–76.8)
Platelets: 173 10*3/uL (ref 145–400)
RBC: 4.38 10*6/uL (ref 3.70–5.45)
lymph#: 17.3 10*3/uL — ABNORMAL HIGH (ref 0.9–3.3)

## 2011-04-06 LAB — TECHNOLOGIST REVIEW

## 2011-05-23 ENCOUNTER — Encounter: Payer: Self-pay | Admitting: Cardiology

## 2011-06-06 ENCOUNTER — Encounter (HOSPITAL_BASED_OUTPATIENT_CLINIC_OR_DEPARTMENT_OTHER): Payer: Self-pay | Admitting: Oncology

## 2011-06-06 ENCOUNTER — Other Ambulatory Visit (HOSPITAL_COMMUNITY): Payer: Self-pay | Admitting: Oncology

## 2011-06-06 DIAGNOSIS — D72829 Elevated white blood cell count, unspecified: Secondary | ICD-10-CM

## 2011-06-06 DIAGNOSIS — C9110 Chronic lymphocytic leukemia of B-cell type not having achieved remission: Secondary | ICD-10-CM

## 2011-06-06 DIAGNOSIS — Z23 Encounter for immunization: Secondary | ICD-10-CM

## 2011-06-06 LAB — CBC WITH DIFFERENTIAL/PLATELET
BASO%: 0.3 % (ref 0.0–2.0)
EOS%: 0.6 % (ref 0.0–7.0)
LYMPH%: 73.9 % — ABNORMAL HIGH (ref 14.0–49.7)
MCHC: 34.5 g/dL (ref 31.5–36.0)
MCV: 84.2 fL (ref 79.5–101.0)
MONO%: 7.7 % (ref 0.0–14.0)
Platelets: 188 10*3/uL (ref 145–400)
RBC: 4.37 10*6/uL (ref 3.70–5.45)

## 2011-06-06 LAB — LACTATE DEHYDROGENASE: LDH: 317 U/L — ABNORMAL HIGH (ref 94–250)

## 2011-06-06 LAB — COMPREHENSIVE METABOLIC PANEL
ALT: 15 U/L (ref 0–35)
AST: 17 U/L (ref 0–37)
Alkaline Phosphatase: 97 U/L (ref 39–117)
Creatinine, Ser: 1.13 mg/dL — ABNORMAL HIGH (ref 0.50–1.10)
Total Bilirubin: 0.2 mg/dL — ABNORMAL LOW (ref 0.3–1.2)

## 2011-07-18 ENCOUNTER — Encounter (HOSPITAL_BASED_OUTPATIENT_CLINIC_OR_DEPARTMENT_OTHER): Payer: Self-pay | Admitting: Oncology

## 2011-07-18 ENCOUNTER — Other Ambulatory Visit (HOSPITAL_COMMUNITY): Payer: Self-pay | Admitting: Oncology

## 2011-07-18 DIAGNOSIS — C9110 Chronic lymphocytic leukemia of B-cell type not having achieved remission: Secondary | ICD-10-CM

## 2011-07-18 DIAGNOSIS — D72829 Elevated white blood cell count, unspecified: Secondary | ICD-10-CM

## 2011-07-18 LAB — CBC WITH DIFFERENTIAL/PLATELET
MCV: 84.9 fL (ref 79.5–101.0)
Platelets: 172 10*3/uL (ref 145–400)
RBC: 4.31 10*6/uL (ref 3.70–5.45)
WBC: 24.5 10*3/uL — ABNORMAL HIGH (ref 3.9–10.3)

## 2011-07-18 LAB — MANUAL DIFFERENTIAL
EOS: 1 % (ref 0–7)
SEG: 15 % — ABNORMAL LOW (ref 38–77)

## 2011-08-31 ENCOUNTER — Encounter (HOSPITAL_BASED_OUTPATIENT_CLINIC_OR_DEPARTMENT_OTHER): Payer: Self-pay | Admitting: Oncology

## 2011-08-31 ENCOUNTER — Other Ambulatory Visit (HOSPITAL_COMMUNITY): Payer: Self-pay | Admitting: Oncology

## 2011-08-31 DIAGNOSIS — D72829 Elevated white blood cell count, unspecified: Secondary | ICD-10-CM

## 2011-08-31 DIAGNOSIS — C9110 Chronic lymphocytic leukemia of B-cell type not having achieved remission: Secondary | ICD-10-CM

## 2011-08-31 LAB — COMPREHENSIVE METABOLIC PANEL
ALT: 16 U/L (ref 0–35)
CO2: 27 mEq/L (ref 19–32)
Creatinine, Ser: 0.94 mg/dL (ref 0.50–1.10)
Total Bilirubin: 0.2 mg/dL — ABNORMAL LOW (ref 0.3–1.2)

## 2011-08-31 LAB — MANUAL DIFFERENTIAL
Basophil: 0 % (ref 0–2)
EOS: 0 % (ref 0–7)
Metamyelocytes: 0 % (ref 0–0)
Myelocytes: 0 % (ref 0–0)
PLT EST: ADEQUATE
PROMYELO: 0 % (ref 0–0)
SEG: 11 % — ABNORMAL LOW (ref 38–77)

## 2011-08-31 LAB — LACTATE DEHYDROGENASE: LDH: 331 U/L — ABNORMAL HIGH (ref 94–250)

## 2011-08-31 LAB — CBC WITH DIFFERENTIAL/PLATELET
MCH: 28.9 pg (ref 25.1–34.0)
MCV: 85.1 fL (ref 79.5–101.0)
RBC: 4.22 10*6/uL (ref 3.70–5.45)
RDW: 13.4 % (ref 11.2–14.5)
WBC: 31.4 10*3/uL — ABNORMAL HIGH (ref 3.9–10.3)

## 2011-12-04 ENCOUNTER — Encounter: Payer: Self-pay | Admitting: Oncology

## 2011-12-04 ENCOUNTER — Other Ambulatory Visit: Payer: Self-pay | Admitting: Lab

## 2011-12-04 ENCOUNTER — Ambulatory Visit: Payer: Self-pay | Admitting: Physician Assistant

## 2011-12-04 ENCOUNTER — Ambulatory Visit (HOSPITAL_BASED_OUTPATIENT_CLINIC_OR_DEPARTMENT_OTHER): Payer: Self-pay | Admitting: Oncology

## 2011-12-04 ENCOUNTER — Other Ambulatory Visit (HOSPITAL_BASED_OUTPATIENT_CLINIC_OR_DEPARTMENT_OTHER): Payer: Self-pay | Admitting: Lab

## 2011-12-04 VITALS — BP 151/80 | HR 68 | Temp 96.9°F | Ht 61.0 in | Wt 191.6 lb

## 2011-12-04 DIAGNOSIS — C9110 Chronic lymphocytic leukemia of B-cell type not having achieved remission: Secondary | ICD-10-CM

## 2011-12-04 DIAGNOSIS — D72829 Elevated white blood cell count, unspecified: Secondary | ICD-10-CM

## 2011-12-04 LAB — CBC WITH DIFFERENTIAL/PLATELET
Basophils Absolute: 0.2 10*3/uL — ABNORMAL HIGH (ref 0.0–0.1)
EOS%: 0.4 % (ref 0.0–7.0)
HCT: 38.5 % (ref 34.8–46.6)
HGB: 13 g/dL (ref 11.6–15.9)
MCH: 29 pg (ref 25.1–34.0)
MONO#: 1.1 10*3/uL — ABNORMAL HIGH (ref 0.1–0.9)
NEUT%: 12.4 % — ABNORMAL LOW (ref 38.4–76.8)
lymph#: 31.9 10*3/uL — ABNORMAL HIGH (ref 0.9–3.3)

## 2011-12-04 LAB — COMPREHENSIVE METABOLIC PANEL
ALT: 12 U/L (ref 0–35)
Alkaline Phosphatase: 94 U/L (ref 39–117)
CO2: 28 mEq/L (ref 19–32)
Creatinine, Ser: 1.17 mg/dL — ABNORMAL HIGH (ref 0.50–1.10)
Sodium: 139 mEq/L (ref 135–145)
Total Bilirubin: 0.4 mg/dL (ref 0.3–1.2)
Total Protein: 6.8 g/dL (ref 6.0–8.3)

## 2011-12-04 LAB — URIC ACID: Uric Acid, Serum: 6.5 mg/dL (ref 2.4–7.0)

## 2011-12-04 NOTE — Progress Notes (Signed)
This office note has been dictated.  #[id number]

## 2011-12-04 NOTE — Progress Notes (Signed)
CC:   Mohan N. Sharyn Lull, M.D. Lamona Curl, MD  PROBLEM LIST: 1. Chronic lymphocytic leukemia, diagnosed in December 2011.  Flow     cytometry was diagnostic for chronic lymphocytic leukemia.     Currently the patient has stage 0 disease. 2. Postmenopausal symptoms ongoing for the past 3-4 years. 3. History of atypical pneumonia diagnosed in November 2011. 4. History of cardiomegaly diagnosed in November 2011. 5. Multinodular thyroid seen on ultrasound 09/12/2010. 6. Hypertension.  MEDICATIONS: 1. Black cohosh 25 mg 3 times a day. 2. Coreg 6.25 mg twice a day. 3. Synthroid 25 mcg daily. 4. Cozaar 15 mg twice daily. 5. Multivitamins 1 daily. 6. Pneumovax administered on 01/04/2011.  HISTORY:  Katherine Terrell is now 51 years old.  She does not speak much [place], and she is accompanied today by an interpreter, Raynelle Fanning.  The patient was last seen by [place] on 08/31/2011.  At that time she was having prominent postmenopausal symptoms.  Fortunately she has seen a gynecologist in [place], apparently a Dr. Freida Busman who has placed her on black cohosh that is helping with her postmenopausal symptoms.  She says that she occasionally has some hot flashes.  She continues to have chronic headaches.  She otherwise seems to be at her normal baseline. Does not seem to have any symptoms related to her underlying CLL for which we have been following her since December 2011.  PHYSICAL EXAMINATION:  The patient shows no obvious changes.  She seems anxious to me.  Weight is 191 pounds 9.6 ounces, height 5 feet 1 inch, body surface area 1.93 meters squared.  Blood pressure 151/80.  Other vital signs are normal.  There is no scleral icterus.  Mouth and pharynx are benign.  No obvious thyroid enlargement.  Lungs are clear to percussion and auscultation.  Cardiac:  Regular rhythm with soft systolic ejection murmur.  Abdomen is obese, nontender with no organomegaly or masses palpable.  Extremities:  No  peripheral edema or clubbing.  Neurologic exam is grossly normal.  There is no peripheral adenopathy palpable in the neck, supraclavicular, axillary or inguinal areas.  LABORATORY DATA:  Today, white count 38.1, ANC 4.7, hemoglobin 13.0, hematocrit 38.5, platelets 192,000.  Absolute lymphocyte count is 31.9. There are 12% neutrophils, 84% lymphs, 3% monocytes.  On 08/31/2011 white count was 31.4 and on 07/18/2011 white count was 24.5. Chemistries today are pending.  Chemistries from 08/31/2011 were notable for an LDH of 331.  Otherwise normal.  Quantitative immunoglobulins on 03/06/2011 were as follows:  IgG 1390, IgA 132, IgM low at 17.  IMAGING STUDIES: 1. CT scans of chest, abdomen and pelvis with IV contrast carried out     on 09/10/2010 showed cardiomegaly with mild interstitial lung     disease, possibly chronic or mild interstitial edema.  There is a     multinodular thyroid.  There was borderline splenomegaly but no     enlarged lymph nodes.  CT scan of the neck on 09/10/2010 showed no     neck masses.  There were no pathologically enlarged cervical lymph     nodes. 2. Thyroid ultrasound on 09/12/2010 showed multinodular thyroid with     no dominant mass requiring biopsy. 3. Chest 2 view on 09/13/2010 showed low lung volumes with increasing     left base atelectasis. 4. Chest x-ray (2 view) on 11/01/2010 showed no evidence for     pneumonia.  Heart size was normal. 5. Digital screening mammogram on 01/10/2011 was negative.  IMPRESSION AND  PLAN:  The patient's white count continues to increase however she is asymptomatic and still has stage 0 disease.  The patient asked about whether there was anything she could personally do to treat her chronic leukemia and to control her rising white count.  Through the interpreter I explained that there was nothing that she can do and that the only thing that we could do would be to treat her with chemotherapy. I specified that I do not  recommend that course of action at this time and that treatment will depend upon clinical findings not the white blood count level per se.  I believe that we have gone over this in the past.  I think the patient is anxious about her condition.  Through the interpreter I tried to explain that this is not curable with treatment and that treatment is indicated only for control of symptoms or impending problems.  The patient does not require therapeutic intervention at this time.  Will plan to see the patient again in 4 months at which time we will check CBC and chemistries.  I believe the uric acid is pending today.    ______________________________ Samul Dada, M.D. DSM/MEDQ  D:  12/04/2011  T:  12/04/2011  Job:  [id number]

## 2011-12-18 ENCOUNTER — Other Ambulatory Visit (HOSPITAL_COMMUNITY): Payer: Self-pay | Admitting: Oncology

## 2011-12-18 DIAGNOSIS — Z1231 Encounter for screening mammogram for malignant neoplasm of breast: Secondary | ICD-10-CM

## 2012-01-08 ENCOUNTER — Other Ambulatory Visit (HOSPITAL_COMMUNITY): Payer: Self-pay | Admitting: Family Medicine

## 2012-01-08 DIAGNOSIS — Z1231 Encounter for screening mammogram for malignant neoplasm of breast: Secondary | ICD-10-CM

## 2012-02-01 ENCOUNTER — Ambulatory Visit (HOSPITAL_COMMUNITY)
Admission: RE | Admit: 2012-02-01 | Discharge: 2012-02-01 | Disposition: A | Discharge status: Home / Self Care | Payer: Self-pay | Source: Ambulatory Visit | Attending: Family Medicine | Admitting: Family Medicine

## 2012-02-01 DIAGNOSIS — Z1231 Encounter for screening mammogram for malignant neoplasm of breast: Secondary | ICD-10-CM

## 2012-03-15 ENCOUNTER — Telehealth: Payer: Self-pay | Admitting: *Deleted

## 2012-03-15 NOTE — Telephone Encounter (Signed)
Received call from [employer] [facility] stating that she saw pt yest. for otitis media & she reported that she was fatigued therefore a CBC was done & her WBC was 36.  She is calling because she was concerned b/c this is elevated.  Reported to [facility] that pt's last WBC was in this range although pt reported to her that it was in this range at our office. She states that makes her feel better & will send records to our office & will have her Med Records request records from our office.

## 2012-03-27 ENCOUNTER — Telehealth: Payer: Self-pay | Admitting: Oncology

## 2012-03-27 NOTE — Telephone Encounter (Signed)
Moved 6/6 appt to 6/27 due to survivor day. Not able to reach pt due to home number not working. called next of kin and s/w pt's dtr via [employer] interpre[employer]. Per dtr she was at work and would [employer] to her husband who would have her mom call me back. dtr informed it's ok for pt to call me back but I really need to let her know the appt for 6/6 has been cx'd. Explained to dtr I cannot reach pt and do not want her to come in for an appt she no longer has. dtr agreed to convey this info the her mom and would have her call back to r/s. appt for 6/27 cx'd. appt will be r/s'd when pt calls back.

## 2012-03-29 ENCOUNTER — Telehealth: Payer: Self-pay | Admitting: Oncology

## 2012-03-29 NOTE — Telephone Encounter (Signed)
Per vm from [place] b set up pt for 6/27.  Mailed appt to pt aom

## 2012-04-04 ENCOUNTER — Other Ambulatory Visit: Payer: Self-pay | Admitting: Lab

## 2012-04-04 ENCOUNTER — Ambulatory Visit: Payer: Self-pay | Admitting: Oncology

## 2012-04-12 ENCOUNTER — Institutional Professional Consult (permissible substitution): Payer: Self-pay | Admitting: Cardiovascular Disease

## 2012-04-25 ENCOUNTER — Ambulatory Visit (HOSPITAL_BASED_OUTPATIENT_CLINIC_OR_DEPARTMENT_OTHER): Payer: Self-pay | Admitting: Oncology

## 2012-04-25 ENCOUNTER — Ambulatory Visit: Payer: Self-pay | Admitting: Oncology

## 2012-04-25 ENCOUNTER — Other Ambulatory Visit: Payer: Self-pay | Admitting: Lab

## 2012-04-25 ENCOUNTER — Encounter: Payer: Self-pay | Admitting: Oncology

## 2012-04-25 ENCOUNTER — Telehealth: Payer: Self-pay | Admitting: Oncology

## 2012-04-25 VITALS — BP 127/81 | HR 71 | Temp 97.3°F | Ht 61.0 in | Wt 191.1 lb

## 2012-04-25 DIAGNOSIS — C9110 Chronic lymphocytic leukemia of B-cell type not having achieved remission: Secondary | ICD-10-CM

## 2012-04-25 LAB — CBC WITH DIFFERENTIAL/PLATELET
Basophils Absolute: 0.1 10*3/uL (ref 0.0–0.1)
EOS%: 0.4 % (ref 0.0–7.0)
HCT: 37.5 % (ref 34.8–46.6)
HGB: 12.4 g/dL (ref 11.6–15.9)
LYMPH%: 87.3 % — ABNORMAL HIGH (ref 14.0–49.7)
MCH: 28.8 pg (ref 25.1–34.0)
MONO#: 1.6 10*3/uL — ABNORMAL HIGH (ref 0.1–0.9)
NEUT%: 8.9 % — ABNORMAL LOW (ref 38.4–76.8)
Platelets: 185 10*3/uL (ref 145–400)
lymph#: 42.8 10*3/uL — ABNORMAL HIGH (ref 0.9–3.3)

## 2012-04-25 LAB — COMPREHENSIVE METABOLIC PANEL
BUN: 16 mg/dL (ref 6–23)
CO2: 27 mEq/L (ref 19–32)
Calcium: 9 mg/dL (ref 8.4–10.5)
Chloride: 102 mEq/L (ref 96–112)
Creatinine, Ser: 1.23 mg/dL — ABNORMAL HIGH (ref 0.50–1.10)
Total Bilirubin: 0.2 mg/dL — ABNORMAL LOW (ref 0.3–1.2)

## 2012-04-25 LAB — LACTATE DEHYDROGENASE: LDH: 344 U/L — ABNORMAL HIGH (ref 94–250)

## 2012-04-25 NOTE — Progress Notes (Signed)
This office note has been dictated.  #[id number]

## 2012-04-25 NOTE — Telephone Encounter (Signed)
appts made and printed for pt aom °

## 2012-04-26 NOTE — Progress Notes (Signed)
CC:   Mohan N. Sharyn Lull, M.D. Lamona Curl, MD  PROBLEM LIST:  1. Chronic lymphocytic leukemia, diagnosed in December 2011. Flow  cytometry was diagnostic for chronic lymphocytic leukemia.  Currently the patient has stage 0 disease.  2. Postmenopausal symptoms ongoing for the past 3-4 years.  3. History of atypical pneumonia diagnosed in November 2011.  4. History of cardiomegaly diagnosed in November 2011.  5. Multinodular thyroid seen on ultrasound 09/12/2010.  6. Hypertension.    MEDICATIONS:  1. Black cohosh 25 mg 3 times a day.  2. Coreg 6.25 mg twice a day.  3. Synthroid 25 mcg daily.  4. Cozaar 15 mg twice daily.  5. Multivitamins 1 daily.  6. Pneumovax administered on 01/04/2011.   HISTORY:  I saw Katherine Terrell today for followup of her chronic lymphocytic leukemia.  The patient was last seen by [place] on 12/04/2011. She is accompanied by her husband and interpreter Juliem, as the patient does not speak much [place].  The patient's main problem has been a cough associated with some fever over the past couple weeks.  Her cough is improving.  Cough was productive of some whitish phlegm.  She has some shortness of breath.  When she was last seen 4 months ago her main problem was hot flashes. Apparently, that symptom has improved.  There are no other apparent symptoms.  The patient seems to be doing fairly well at this time.  PHYSICAL EXAM:  She has a congested unproductive cough.  She is in no acute distress and does not look ill.  There are no obvious changes in her appearance.  Weight is stable at 191.1 pounds, height 5 feet 1 inch. Body surface area 1.93 m2.  Blood pressure 127/81.  Other vital signs are normal.  O2 saturation on room air at rest was 96%.  There was no scleral icterus.  Mouth and pharynx are benign.  There is no peripheral adenopathy palpable in the neck, supraclavicular, axillary, or inguinal areas.  No obvious thyroid enlargement.  Lungs are clear to  percussion and auscultation.  Cardiac:  Regular rhythm with systolic ejection murmur.  Breasts:  Were not examined.  Abdomen:  Obese, nontender with no organomegaly or masses palpable.  Extremities:  No peripheral edema or clubbing.  Neurologic:  Exam was normal.  LABORATORY DATA:  Today, white count 49.1 as compared with 38.1 on 12/04/2011 and 31.4 on 08/31/2011.  ANC was 4.4, hemoglobin 12.4, hematocrit 37.5, platelets 185,000.  Absolute lymphocyte count was 42.8 as compared with 31.9 on 12/04/2011.  There are 9% neutrophils, 87% lymphocytes, 3% monocytes.  Chemistries from today notable for a BUN of 16, creatinine 1.23, and LDH of 344.  On 12/04/2011 the LDH was 306, and on 08/31/2011 it was 331.  Uric acid from 12/04/2011 was 6.5.  IMAGING STUDIES:  1. CT scans of chest, abdomen and pelvis with IV contrast carried out  on 09/10/2010 showed cardiomegaly with mild interstitial lung  disease, possibly chronic or mild interstitial edema. There is a  multinodular thyroid. There was borderline splenomegaly but no  enlarged lymph nodes. CT scan of the neck on 09/10/2010 showed no  neck masses. There were no pathologically enlarged cervical lymph  nodes.  2. Thyroid ultrasound on 09/12/2010 showed multinodular thyroid with  no dominant mass requiring biopsy.  3. Chest 2 view on 09/13/2010 showed low lung volumes with increasing  left base atelectasis.  4. Chest x-ray (2 view) on 11/01/2010 showed no evidence for  pneumonia. Heart size was  normal.  5. Digital screening mammogram on 01/10/2011 was negative. 6. Digital screening mammogram on 02/01/2012 was negative.  IMPRESSION AND PLAN:  The patient remains asymptomatic from the standpoint of his CLL.  She has stage 0 disease.  Aside from the rising white count the patient's disease seems to be stable.  Once again, the patient asked about how long she had to come here.  I had Raynelle Fanning explain to her that she has a chronic condition called  chronic lymphocytic leukemia and that she needs ongoing followup.  At some point in the future she may require treatment for this condition but followup is strongly advised.  The patient was given a copy of her CBC and chemistries.  We will plan to see the patient again in 4 months at which time we will check CBC and chemistries as well as a uric acid.    ______________________________ Samul Dada, M.D. DSM/MEDQ  D:  04/25/2012  T:  04/26/2012  Job:  [id number]

## 2012-08-27 ENCOUNTER — Ambulatory Visit (HOSPITAL_BASED_OUTPATIENT_CLINIC_OR_DEPARTMENT_OTHER): Payer: Self-pay | Admitting: Family

## 2012-08-27 ENCOUNTER — Other Ambulatory Visit (HOSPITAL_BASED_OUTPATIENT_CLINIC_OR_DEPARTMENT_OTHER): Payer: Self-pay | Admitting: Lab

## 2012-08-27 ENCOUNTER — Encounter: Payer: Self-pay | Admitting: Family

## 2012-08-27 VITALS — BP 163/81 | HR 79 | Temp 98.4°F | Resp 20 | Ht 61.0 in | Wt 187.1 lb

## 2012-08-27 DIAGNOSIS — C9110 Chronic lymphocytic leukemia of B-cell type not having achieved remission: Secondary | ICD-10-CM

## 2012-08-27 LAB — CBC WITH DIFFERENTIAL/PLATELET
BASO%: 0.2 % (ref 0.0–2.0)
EOS%: 0.3 % (ref 0.0–7.0)
LYMPH%: 88.9 % — ABNORMAL HIGH (ref 14.0–49.7)
MCH: 28.4 pg (ref 25.1–34.0)
MCHC: 32.9 g/dL (ref 31.5–36.0)
MONO#: 1.1 10*3/uL — ABNORMAL HIGH (ref 0.1–0.9)
Platelets: 177 10*3/uL (ref 145–400)
RBC: 4.28 10*6/uL (ref 3.70–5.45)
WBC: 51.6 10*3/uL (ref 3.9–10.3)

## 2012-08-27 LAB — COMPREHENSIVE METABOLIC PANEL (CC13)
ALT: 17 U/L (ref 0–55)
AST: 18 U/L (ref 5–34)
Alkaline Phosphatase: 97 U/L (ref 40–150)
CO2: 28 mEq/L (ref 22–29)
Creatinine: 1.2 mg/dL — ABNORMAL HIGH (ref 0.6–1.1)
Sodium: 140 mEq/L (ref 136–145)
Total Bilirubin: 0.24 mg/dL (ref 0.20–1.20)
Total Protein: 7.3 g/dL (ref 6.4–8.3)

## 2012-08-27 NOTE — Progress Notes (Signed)
Patient ID: Katherine Terrell, female   DOB: Oct 23, 1961, 51 y.o.   MRN: [id number] CSN: [id number]  Cc: Wandra Mannan, MD  Problem List: Katherine Terrell is a 51 y.o. Hispanic female with a problem list consisting of:  1. Chronic lymphocytic leukemia, diagnosed in December 2011. Flow cytometry was diagnostic for chronic lymphocytic leukemia.  Currently the patient has stage 0 disease  2. Postmenopausal symptoms ongoing for the past 3-4 years 3. History of atypical pneumonia diagnosed in November 2011  4. History of cardiomegaly diagnosed in November 2011  5. Multinodular thyroid seen on ultrasound 09/12/2010  6. Hypertension 7. Pneumovax administered on 01/04/2011 8. Influenza vaccination administered in October 2013  Katherine Terrell is a 51 year-old Hispanic female with a history of chronic lymphocytic leukemia. She does not speak much [place], and she is accompanied today by an interpreter, Raynelle Fanning and her husband for today's office visit. The patient was last seen by [place] on 04/25/2012. She continues to have prominent perimenopausal symptoms including hot flashes and night sweats which in turn give her insomnia. She has been seen by a  gynecologist in [place], Dr. Freida Busman who placed her on black cohosh, but she has stop taking this supplement.  The patient continues to have chronic headaches.  She has discontinued taking her blood pressure medication for the past week and her blood pressure is 163/81 today  She also describes and incident within the last week in which her face, mouth and tongue became swollen for no apparent reason.  The patient was taking Lisinopril to control her blood pressure and may have experienced an episode of angioedema.  She did not seek medical attention and her symptoms have since subsided.  The seriousness of angioedema was explained to [name] and it was explained to her to proceed immediately to the ER if she has the symptoms again.  She has complaints of right shoulder  and upper back pain.  [name] also has complaints of feeling of wanting "to pass out" on nights that she does not get enough sleep, she then drinks a cola soft-drink and this causes her nausea/emesis.  She states that she receive a left buttock injection of "vitamins" 2 years ago that is still painful and discolored.  The ecchymotic area on her left buttock measures is round with measurements of 4 cm long by 3.5 cm wide.  [name] also had complaints of polyuria and nocturia.  She does not seem to have any symptoms related to her underlying CLL for which we have been following her since December 2011.  She denies any other symptomatology including chills, unusual bleeding, cough, SOB,  diarrhea or constipation.   Past Medical History: Past Medical History  Diagnosis Date  . Chronic lymphocytic leukemia     followed by Dr. Arline Asp  . Hyperlipidemia   . Hypertension   . Multinodular goiter     with hypothyroidism  . Cardiomyopathy     echo: (11/11) EF 25-30% with diffuse hypokinesis, mild left atrial enlargement. 11/11 HIV and ANA negative. TSH normal. LHC (1/12): EF 50-55% no angiographic CAD. Echo (2/12): EF 55-60% moderate LV hypertrophy, grade I diastolic dysfunction, mild MR, normal RV    Surgical History: Past Surgical History  Procedure Date  . Cardiac catheterization 2012    @ [facility]    Current Medications: Current Outpatient Prescriptions  Medication Sig Dispense Refill  . ibuprofen (ADVIL,MOTRIN) 200 MG tablet Take 400 mg by mouth as needed.      Marland Kitchen levothyroxine (SYNTHROID,  LEVOTHROID) 25 MCG tablet Take 25 mcg by mouth daily before breakfast.        . Multiple Vitamins-Minerals (MULTIVITAMIN WITH MINERALS) tablet Take 1 tablet by mouth daily.      Marland Kitchen lisinopril (PRINIVIL,ZESTRIL) 20 MG tablet Take 20 mg by mouth daily.        Allergies: Allergies  Allergen Reactions  . Lisinopril Other (See Comments)    Angioedema    Family History: Family History    Problem Relation Age of Onset  . Heart attack Mother 47    Social History: History  Substance Use Topics  . Smoking status: Never Smoker   . Smokeless tobacco: Never Used  . Alcohol Use: No    Review of Systems: 10 Point review of systems was completed and is negative except as noted above.   Physical Exam:   Blood pressure 163/81, pulse 79, temperature 98.4 F (36.9 C), temperature source Oral, resp. rate 20, height 5\' 1"  (1.549 m), weight 187 lb 1.6 oz (84.868 kg).     General appearance: Alert, cooperative, well nourished, no apparent distress Head: Normocephalic, without obvious abnormality, atraumatic  Eyes: Conjunctivae/corneas clear, PERRLA, EOMI  Nose: Nares, septum and mucosa are normal, no drainage or sinus tenderness  Neck: Goiter, no adenopathy, supple, symmetrical, trachea midline Resp: Clear to auscultation bilaterally  Cardio: Regular rate and rhythm, S1, S2 normal, 1/6 murmur, no click, rub or gallop  GI: Soft, non-tender, distended, hypoactive bowel sounds, no organomegaly, left abdominal area palpable lipoma. Extremities: Extremities normal, atraumatic, no cyanosis or edema  Skin:  Ecchymotic area on left buttock 4 cm long by 3.5 cm wide. Lymph nodes: Cervical, supraclavicular and axillary nodes are normal  Neurologic: Grossly normal     Laboratory Data: Results for orders placed in visit on 08/27/12 (from the past 48 hour(s))  CBC WITH DIFFERENTIAL     Status: Abnormal   Collection Time   08/27/12  3:34 PM      Component Value Range Comment   WBC 51.6 (*) 3.9 - 10.3 10e3/uL    NEUT# 4.3  1.5 - 6.5 10e3/uL    HGB 12.2  11.6 - 15.9 g/dL    HCT 16.1  09.6 - 04.5 %    Platelets 177  145 - 400 10e3/uL    MCV 86.3  79.5 - 101.0 fL    MCH 28.4  25.1 - 34.0 pg    MCHC 32.9  31.5 - 36.0 g/dL    RBC 4.09  8.11 - 9.14 10e6/uL    RDW 13.4  11.2 - 14.5 %    lymph# 45.8 (*) 0.9 - 3.3 10e3/uL    MONO# 1.1 (*) 0.1 - 0.9 10e3/uL    Eosinophils Absolute 0.2   0.0 - 0.5 10e3/uL    Basophils Absolute 0.1  0.0 - 0.1 10e3/uL    NEUT% 8.4 (*) 38.4 - 76.8 %    LYMPH% 88.9 (*) 14.0 - 49.7 %    MONO% 2.2  0.0 - 14.0 %    EOS% 0.3  0.0 - 7.0 %    BASO% 0.2  0.0 - 2.0 %   LACTATE DEHYDROGENASE (CC13)     Status: Abnormal   Collection Time   08/27/12  3:34 PM      Component Value Range Comment   LDH 376 (*) 125 - 220 U/L   TECHNOLOGIST REVIEW     Status: Normal   Collection Time   08/27/12  3:34 PM      Component Value Range  Comment   Technologist Review Variant lymphs present        Imaging Studies: 1. CT scans of chest, abdomen and pelvis with IV contrast carried out on 09/10/2010 showed cardiomegaly with mild interstitial lung disease, possibly chronic or mild interstitial edema. There is a multinodular thyroid. There was borderline splenomegaly but no enlarged lymph nodes. CT scan of the neck on 09/10/2010 showed no neck masses. There were no pathologically enlarged cervical lymph nodes.  2. Thyroid ultrasound on 09/12/2010 showed multinodular thyroid with no dominant mass requiring biopsy.  3. Chest 2 view on 09/13/2010 showed low lung volumes with increasing left base atelectasis.  4. Chest x-ray (2 view) on 11/01/2010 showed no evidence for pneumonia. Heart size was normal.  5. Digital screening mammogram on 01/10/2011 was negative.  6. Digital screening mammogram on 02/01/2012 was negative.   Impression/Plan: The patient remains asymptomatic from the standpoint of his CLL. She has stage 0 disease. Aside from the rising white count, the patient's disease seems to be stable. Dr. Arline Asp discussed the current stability of her disease with the patient and her husband through interpreter assistance.  It was explained to the patient and her husband that she has chronic lymphocytic leukemia that needs ongoing follow up.   At some point in the future she may require treatment for this condition but for now follow up is strongly advised. The patient  was given a copy of her laboratories as she stated she has an office visit scheduled with Dr. Angela Adam tomorrow.  The patient was asked to discuss her night sweats, right shoulder pain, left buttocks ecchymosis, polyuria/nocturia , nausea/emesis, possible angioedema and elevated blood pressure with her PCP, Dr. Angela Adam.   We will plan to see the patient again in 4 months (12/24/2012) at which time we will check CBC and chemistries as well as a uric acid.  The patient and her husband are asked to contact us in the interim if she has any questions or concerns.    Larina Bras, NP-C 08/27/2012, 10:21 PM

## 2012-08-27 NOTE — Patient Instructions (Addendum)
Please discuss with Dr. Angela Adam about:  Night sweats  Right shoulder pain  Left buttocks hematoma  Polyuria and nocturia  Feelings of wanting to "pass out"  Nausea after drinking Coca[employer]   Ms. Katherine Terrell has not been taking blood her pressure medication (BP 163/81) and may be allergic to Lisinopril - described that she had what sounds like facial angioedema x 1 week - did not go to ER  Recent Results (from the past 72 hour(s))  CBC WITH DIFFERENTIAL   Collection Time   08/27/12  3:34 PM      Component Value Range   WBC 51.6 (*) 3.9 - 10.3 10e3/uL   NEUT# 4.3  1.5 - 6.5 10e3/uL   HGB 12.2  11.6 - 15.9 g/dL   HCT 16.1  09.6 - 04.5 %   Platelets 177  145 - 400 10e3/uL   MCV 86.3  79.5 - 101.0 fL   MCH 28.4  25.1 - 34.0 pg   MCHC 32.9  31.5 - 36.0 g/dL   RBC 4.09  8.11 - 9.14 10e6/uL   RDW 13.4  11.2 - 14.5 %   lymph# 45.8 (*) 0.9 - 3.3 10e3/uL   MONO# 1.1 (*) 0.1 - 0.9 10e3/uL   Eosinophils Absolute 0.2  0.0 - 0.5 10e3/uL   Basophils Absolute 0.1  0.0 - 0.1 10e3/uL   NEUT% 8.4 (*) 38.4 - 76.8 %   LYMPH% 88.9 (*) 14.0 - 49.7 %   MONO% 2.2  0.0 - 14.0 %   EOS% 0.3  0.0 - 7.0 %   BASO% 0.2  0.0 - 2.0 %  LACTATE DEHYDROGENASE (CC13)   Collection Time   08/27/12  3:34 PM      Component Value Range   LDH 376 (*) 125 - 220 U/L  TECHNOLOGIST REVIEW   Collection Time   08/27/12  3:34 PM      Component Value Range   Technologist Review Variant lymphs present

## 2012-08-28 ENCOUNTER — Telehealth: Payer: Self-pay | Admitting: Oncology

## 2012-08-28 NOTE — Telephone Encounter (Signed)
Not able to reach pt by phone. Number not working. February schedule mailed.

## 2012-12-24 ENCOUNTER — Ambulatory Visit (HOSPITAL_BASED_OUTPATIENT_CLINIC_OR_DEPARTMENT_OTHER): Payer: Self-pay | Admitting: Oncology

## 2012-12-24 ENCOUNTER — Other Ambulatory Visit: Payer: Self-pay | Admitting: Oncology

## 2012-12-24 ENCOUNTER — Encounter: Payer: Self-pay | Admitting: Oncology

## 2012-12-24 ENCOUNTER — Other Ambulatory Visit (HOSPITAL_BASED_OUTPATIENT_CLINIC_OR_DEPARTMENT_OTHER): Payer: Self-pay | Admitting: Lab

## 2012-12-24 VITALS — BP 121/66 | HR 73 | Temp 97.1°F | Resp 20 | Ht 61.0 in | Wt 190.9 lb

## 2012-12-24 DIAGNOSIS — C9110 Chronic lymphocytic leukemia of B-cell type not having achieved remission: Secondary | ICD-10-CM

## 2012-12-24 LAB — COMPREHENSIVE METABOLIC PANEL (CC13)
ALT: 19 U/L (ref 0–55)
Alkaline Phosphatase: 101 U/L (ref 40–150)
Sodium: 142 mEq/L (ref 136–145)
Total Bilirubin: 0.3 mg/dL (ref 0.20–1.20)
Total Protein: 6.9 g/dL (ref 6.4–8.3)

## 2012-12-24 LAB — CBC WITH DIFFERENTIAL/PLATELET
Basophils Absolute: 0.1 10*3/uL (ref 0.0–0.1)
EOS%: 0.3 % (ref 0.0–7.0)
Eosinophils Absolute: 0.2 10*3/uL (ref 0.0–0.5)
HGB: 12.1 g/dL (ref 11.6–15.9)
LYMPH%: 88.7 % — ABNORMAL HIGH (ref 14.0–49.7)
MCH: 28.6 pg (ref 25.1–34.0)
MCV: 84.9 fL (ref 79.5–101.0)
MONO%: 2.7 % (ref 0.0–14.0)
NEUT#: 4.4 10*3/uL (ref 1.5–6.5)
Platelets: 176 10*3/uL (ref 145–400)
RBC: 4.23 10*6/uL (ref 3.70–5.45)
RDW: 13.6 % (ref 11.2–14.5)

## 2012-12-24 LAB — LACTATE DEHYDROGENASE (CC13): LDH: 369 U/L — ABNORMAL HIGH (ref 125–245)

## 2012-12-24 LAB — URIC ACID (CC13): Uric Acid, Serum: 7.1 mg/dl (ref 2.6–7.4)

## 2012-12-24 NOTE — Progress Notes (Signed)
This office note has been dictated.  #[id number]

## 2012-12-25 ENCOUNTER — Encounter: Payer: Self-pay | Admitting: Oncology

## 2012-12-25 NOTE — Progress Notes (Signed)
Received application for assistance. She is 100% ind 12/25/12-06/24/13 = 1 family and I have sent letter and green card to the patient.

## 2012-12-26 NOTE — Progress Notes (Signed)
CC:   Katherine Bong, MD, Fax [phone] Katherine Terrell. Katherine Terrell, M.D.  PROBLEM LIST:  1. Chronic lymphocytic leukemia, diagnosed in December 2011. Flow  cytometry was diagnostic for chronic lymphocytic leukemia.  Currently the patient has stage 0 disease.  2. Postmenopausal symptoms ongoing for the past 3-4 years.  3. History of atypical pneumonia diagnosed in November 2011.  4. History of cardiomegaly diagnosed in November 2011.  5. Multinodular thyroid seen on ultrasound 09/12/2010.  6. Hypertension.   MEDICATIONS:  Reviewed and recorded. Current Outpatient Prescriptions  Medication Sig Dispense Refill  . carvedilol (COREG) 6.25 MG tablet Take 6.25 mg by mouth 2 (two) times daily with a meal.      . levothyroxine (SYNTHROID, LEVOTHROID) 25 MCG tablet Take 25 mcg by mouth daily before breakfast.        . lisinopril (PRINIVIL,ZESTRIL) 20 MG tablet Take 20 mg by mouth daily.      . Multiple Vitamins-Minerals (MULTIVITAMIN WITH MINERALS) tablet Take 1 tablet by mouth daily.      Marland Kitchen ibuprofen (ADVIL,MOTRIN) 200 MG tablet Take 400 mg by mouth as needed.       No current facility-administered medications for this visit.    IMMUNIZATIONS: 1. Pneumovax was administered on 01/04/2011. 2. Flu shot was administered in October 2013.  SMOKING HISTORY:  Patient has never smoked cigarettes.    HISTORY:  I saw Katherine Terrell today for followup of her chronic lymphocytic leukemia diagnosed in December 2011.  The patient has stage 0 disease and has never required any treatment.  She appears to be without any symptoms related to her underlying CLL.  She does have some hot flashes attributed to postmenopausal symptoms.  Apparently, she is taking black cohosh.  She is here today with her husband, Katherine Terrell and interpreter, Katherine Terrell.  The patient speaks minimal [place].  She was last seen by [place] on 08/27/2012 and prior to that on 12/04/2011.  PHYSICAL EXAMINATION:  General:  The patient shows little  change. Weight is 190 pounds 14.4 ounces, height 5 feet 1 inch, body surface area 1.93 sq m.  Vital Signs:  Blood pressure 121/66.  Other vital signs are normal.  HEENT:  There is no scleral icterus.  Mouth and pharynx are benign.  Lungs:  Clear to percussion and auscultation.  Cardiac: Essentially regular rhythm with ectopic beat about every 3 or 4 beats. I did not take note of a systolic ejection murmur today.  Abdomen: Obese, nontender, with no organomegaly or masses palpable.  Extremities: No peripheral edema or clubbing.  Neurologic:  Normal.  Port:  The patient does not have a Port-A-Cath or central catheter.  Lymph nodes: No peripheral adenopathy palpable in the neck, supraclavicular, axillary, or inguinal areas.  LABORATORY DATA:  White count 53.2, ANC 4.4, absolute lymphocyte count 47.1.  There are 8% neutrophils, 89% lymphs.  Hemoglobin 12.1, hematocrit 35.9, platelets 176,000.  Chemistries today were normal, except for an LDH of 369.  Uric acid is currently pending.  Uric acid on 08/27/2012 was 8.0.  IMAGING STUDIES:  1. CT scans of chest, abdomen and pelvis with IV contrast carried out  on 09/10/2010 showed cardiomegaly with mild interstitial lung  disease, possibly chronic or mild interstitial edema. There is a  multinodular thyroid. There was borderline splenomegaly but no  enlarged lymph nodes. CT scan of the neck on 09/10/2010 showed no  neck masses. There were no pathologically enlarged cervical lymph  nodes.  2. Thyroid ultrasound on 09/12/2010 showed multinodular thyroid with  no dominant mass requiring biopsy.  3. Chest 2 view on 09/13/2010 showed low lung volumes with increasing  left base atelectasis.  4. Chest x-ray (2 view) on 11/01/2010 showed no evidence for  pneumonia. Heart size was normal.  5. Digital screening mammogram on 01/10/2011 was negative. 6. Digital screening mammogram from 02/01/2012 was negative.   IMPRESSION AND PLAN:  The patient's  condition remains basically stable. Her white count had been increasing over the past couple of years.  Back in December 2011 the white count was 15.1.  Back on October 29th the white count was 51.6 and on 04/25/2012 49.1; thus, the white count seems to have been stable during the past 8 months or so.  More importantly, the hemoglobin, hematocrit, and platelet count remain normal and stable.  The patient is now 52 years old.  I recommended that she talk with her primary care physician, Dr. Angela Terrell, about having a screening colonoscopy.  The patient will be due for another mammogram in April.  We will plan to check a CBC in 3 months and plan to see Katherine Terrell again in 6 months, at which time will check CBC, chemistries, LDH, and uric acid.    ______________________________ Samul Dada, M.D. DSM/MEDQ  D:  12/24/2012  T:  12/25/2012  Job:  [id number]

## 2013-01-02 ENCOUNTER — Other Ambulatory Visit (HOSPITAL_COMMUNITY): Payer: Self-pay | Admitting: Family Medicine

## 2013-02-03 ENCOUNTER — Ambulatory Visit (HOSPITAL_COMMUNITY)
Admission: RE | Admit: 2013-02-03 | Discharge: 2013-02-03 | Disposition: A | Discharge status: Home / Self Care | Payer: Self-pay | Source: Ambulatory Visit | Attending: Family Medicine | Admitting: Family Medicine

## 2013-02-03 DIAGNOSIS — Z1231 Encounter for screening mammogram for malignant neoplasm of breast: Secondary | ICD-10-CM

## 2013-02-07 ENCOUNTER — Telehealth: Payer: Self-pay | Admitting: Oncology

## 2013-02-07 NOTE — Telephone Encounter (Signed)
pt should have lab and est..Marland Kitchenpt would like to change Dr....lm  and emailed Arvin Collard to Cy Blamer the interpreter @ cell [phone].Marland KitchenMarland Kitchen

## 2013-03-21 ENCOUNTER — Other Ambulatory Visit (HOSPITAL_BASED_OUTPATIENT_CLINIC_OR_DEPARTMENT_OTHER): Payer: Self-pay | Admitting: Lab

## 2013-03-21 DIAGNOSIS — C9110 Chronic lymphocytic leukemia of B-cell type not having achieved remission: Secondary | ICD-10-CM

## 2013-03-21 LAB — MANUAL DIFFERENTIAL
Basophil: 0 % (ref 0–2)
EOS: 0 % (ref 0–7)
Metamyelocytes: 0 % (ref 0–0)
Myelocytes: 0 % (ref 0–0)
PLT EST: ADEQUATE
PROMYELO: 0 % (ref 0–0)

## 2013-03-21 LAB — CBC WITH DIFFERENTIAL/PLATELET
MCH: 27.8 pg (ref 25.1–34.0)
MCV: 85.1 fL (ref 79.5–101.0)
RBC: 4.11 10*6/uL (ref 3.70–5.45)
RDW: 13.3 % (ref 11.2–14.5)
WBC: 59.7 10*3/uL (ref 3.9–10.3)

## 2013-05-05 ENCOUNTER — Telehealth: Payer: Self-pay | Admitting: *Deleted

## 2013-05-05 NOTE — Telephone Encounter (Signed)
PT. WAS SEEN ON 05/01/13. SHE HAD HAD A FEVER FOR TWO DAYS. PT.'S WBC IS 69,000. EVERYTHING ELSE WAS NORMAL. THIS NOTE TO ROBIN BASS,RN.

## 2013-05-06 ENCOUNTER — Telehealth: Payer: Self-pay | Admitting: *Deleted

## 2013-05-06 NOTE — Telephone Encounter (Signed)
Dr. Loleta Rose called asking if patient's f/u appointment needs to be sooner than June 24, 2013.  Dr. Suszanne Conners saw patient last week and WBC = 69, 000 with a low platelet count.  Patient has had fevers and malaise.  Today she feels better with no fever.  Asked for labs to be faxed to Triage fax number [phone].  Patient works two jobs and asked that if appointment is changed, she needs a late afternoon at 3:30 pm appointment.  Will notify providers.  Dr. Loleta Rose can be reached via cell number [phone].

## 2013-05-18 ENCOUNTER — Emergency Department (HOSPITAL_COMMUNITY): Payer: Self-pay

## 2013-05-18 ENCOUNTER — Emergency Department (HOSPITAL_COMMUNITY)
Admission: EM | Admit: 2013-05-18 | Discharge: 2013-05-18 | Disposition: A | Discharge status: Home / Self Care | Payer: Self-pay | Attending: Emergency Medicine | Admitting: Emergency Medicine

## 2013-05-18 ENCOUNTER — Encounter (HOSPITAL_COMMUNITY): Payer: Self-pay

## 2013-05-18 DIAGNOSIS — E039 Hypothyroidism, unspecified: Secondary | ICD-10-CM | POA: Insufficient documentation

## 2013-05-18 DIAGNOSIS — Z79899 Other long term (current) drug therapy: Secondary | ICD-10-CM | POA: Insufficient documentation

## 2013-05-18 DIAGNOSIS — R109 Unspecified abdominal pain: Secondary | ICD-10-CM | POA: Insufficient documentation

## 2013-05-18 DIAGNOSIS — E042 Nontoxic multinodular goiter: Secondary | ICD-10-CM | POA: Insufficient documentation

## 2013-05-18 DIAGNOSIS — C9110 Chronic lymphocytic leukemia of B-cell type not having achieved remission: Secondary | ICD-10-CM | POA: Insufficient documentation

## 2013-05-18 DIAGNOSIS — E785 Hyperlipidemia, unspecified: Secondary | ICD-10-CM | POA: Insufficient documentation

## 2013-05-18 DIAGNOSIS — I10 Essential (primary) hypertension: Secondary | ICD-10-CM | POA: Insufficient documentation

## 2013-05-18 LAB — BASIC METABOLIC PANEL
CO2: 26 mEq/L (ref 19–32)
Calcium: 8.9 mg/dL (ref 8.4–10.5)
Chloride: 106 mEq/L (ref 96–112)
GFR calc non Af Amer: 71 mL/min — ABNORMAL LOW (ref >90)
Glucose, Bld: 113 mg/dL — ABNORMAL HIGH (ref 70–99)

## 2013-05-18 LAB — URINALYSIS, ROUTINE W REFLEX MICROSCOPIC
Protein, ur: NEGATIVE mg/dL
Specific Gravity, Urine: 1.006 (ref 1.005–1.030)
Urobilinogen, UA: 0.2 mg/dL (ref 0.0–1.0)

## 2013-05-18 LAB — CBC WITH DIFFERENTIAL/PLATELET
Basophils Absolute: 0 10*3/uL (ref 0.0–0.1)
Basophils Relative: 0 % (ref 0–1)
Eosinophils Relative: 0 % (ref 0–5)
Hemoglobin: 11.7 g/dL — ABNORMAL LOW (ref 12.0–15.0)
Lymphocytes Relative: 89 % — ABNORMAL HIGH (ref 12–46)
Lymphs Abs: 52.1 10*3/uL — ABNORMAL HIGH (ref 0.7–4.0)
MCHC: 34.2 g/dL (ref 30.0–36.0)
Neutro Abs: 5.9 10*3/uL (ref 1.7–7.7)
Neutrophils Relative %: 10 % — ABNORMAL LOW (ref 43–77)
Promyelocytes Absolute: 0 %
RBC: 4.08 MIL/uL (ref 3.87–5.11)
WBC: 58.6 10*3/uL (ref 4.0–10.5)

## 2013-05-18 LAB — URINE MICROSCOPIC-ADD ON

## 2013-05-18 MED ORDER — OXYCODONE-ACETAMINOPHEN 5-325 MG PO TABS
2.0000 | ORAL_TABLET | Freq: Once | ORAL | Status: AC
  Administered 2013-05-18: 2 via ORAL
  Filled 2013-05-18: qty 2

## 2013-05-18 MED ORDER — OXYCODONE-ACETAMINOPHEN 5-325 MG PO TABS: 2.0000 | ORAL_TABLET | ORAL | Status: DC | PRN

## 2013-05-18 NOTE — ED Notes (Signed)
Discharge instructions reviewed with pt and significant other via interpreter. Both pt and significant other verbalized understanding.

## 2013-05-18 NOTE — ED Provider Notes (Signed)
History    CSN: [id number] Arrival date & time 05/18/13  0905  First MD Initiated Contact with Patient 05/18/13 0930     Chief Complaint  Patient presents with  . Abdominal Pain   (Consider location/radiation/quality/duration/timing/severity/associated sxs/prior Treatment) Patient is a 52 y.o. female presenting with abdominal pain. The history is provided by the patient.  Abdominal Pain Associated symptoms include abdominal pain.   patient here complaining of suprapubic pain similar to her prior UTI. Some flank pain without fever or vomiting. No vaginal bleeding or discharge. Recently treated for UTI. Not taking antibiotics currently. No cough or shortness of breath. Symptoms have been persistent and nothing makes them better Past Medical History  Diagnosis Date  . Chronic lymphocytic leukemia     followed by Dr. Arline Asp  . Hyperlipidemia   . Hypertension   . Multinodular goiter     with hypothyroidism  . Cardiomyopathy     echo: (11/11) EF 25-30% with diffuse hypokinesis, mild left atrial enlargement. 11/11 HIV and ANA negative. TSH normal. LHC (1/12): EF 50-55% no angiographic CAD. Echo (2/12): EF 55-60% moderate LV hypertrophy, grade I diastolic dysfunction, mild MR, normal RV   Past Surgical History  Procedure Laterality Date  . Cardiac catheterization  2012    @ [place]   Family History  Problem Relation Age of Onset  . Heart attack Mother 83   History  Substance Use Topics  . Smoking status: Never Smoker   . Smokeless tobacco: Never Used  . Alcohol Use: No   OB History   Grav Para Term Preterm Abortions TAB SAB Ect Mult Living                 Review of Systems  Gastrointestinal: Positive for abdominal pain.  All other systems reviewed and are negative.    Allergies  Review of patient's allergies indicates no known allergies.  Home Medications   Current Outpatient Rx  Name  Route  Sig  Dispense  Refill  . carvedilol (COREG) 6.25 MG tablet    Oral   Take 6.25 mg by mouth 2 (two) times daily with a meal.         . levothyroxine (SYNTHROID, LEVOTHROID) 25 MCG tablet   Oral   Take 25 mcg by mouth daily before breakfast.           . lisinopril (PRINIVIL,ZESTRIL) 20 MG tablet   Oral   Take 20 mg by mouth daily.         Marland Kitchen OVER THE COUNTER MEDICATION      daily as needed. Urical for         . ibuprofen (ADVIL,MOTRIN) 200 MG tablet   Oral   Take 400 mg by mouth as needed.          BP 176/105  Pulse 78  Temp(Src) 97.9 F (36.6 C) (Oral)  Resp 16  SpO2 98% Physical Exam  Nursing note and vitals reviewed. Constitutional: She is oriented to person, place, and time. She appears well-developed and well-nourished.  Non-toxic appearance. No distress.  HENT:  Head: Normocephalic and atraumatic.  Eyes: Conjunctivae, EOM and lids are normal. Pupils are equal, round, and reactive to light.  Neck: Normal range of motion. Neck supple. No tracheal deviation present. No mass present.  Cardiovascular: Normal rate, regular rhythm and normal heart sounds.  Exam reveals no gallop.   No murmur heard. Pulmonary/Chest: Effort normal and breath sounds normal. No stridor. No respiratory distress. She has no decreased breath  sounds. She has no wheezes. She has no rhonchi. She has no rales.  Abdominal: Soft. Normal appearance and bowel sounds are normal. She exhibits no distension. There is no tenderness. There is no rigidity, no rebound, no guarding and no CVA tenderness.  Musculoskeletal: Normal range of motion. She exhibits no edema and no tenderness.  Neurological: She is alert and oriented to person, place, and time. She has normal strength. No cranial nerve deficit or sensory deficit. GCS eye subscore is 4. GCS verbal subscore is 5. GCS motor subscore is 6.  Skin: Skin is warm and dry. No abrasion and no rash noted.  Psychiatric: She has a normal mood and affect. Her speech is normal and behavior is normal.    ED Course   Procedures (including critical care time) Labs Reviewed - No data to display No results found. No diagnosis found.  MDM  Patient's leukocytosis noted and this is chronic due to her baseline history of leukemia. Urinalysis negative. Abdominal CT without acute findings. Pain medication given here and she is stable for discharge  Toy Baker, MD 05/18/13 1327

## 2013-05-18 NOTE — ED Notes (Signed)
Called lab to follow up with Urinalysis, lab reports it's currently "running".

## 2013-05-18 NOTE — ED Notes (Signed)
Patient presents with generalized abdominal pain with tenderness to LUQ and RLQ.  Patient reports she's been using bathroom constantly with some pain upon urination.  Patient ambulatory, denies nausea, vomiting, diarrhea.

## 2013-05-25 ENCOUNTER — Emergency Department (HOSPITAL_COMMUNITY): Payer: Self-pay

## 2013-05-25 ENCOUNTER — Emergency Department (HOSPITAL_COMMUNITY)
Admission: EM | Admit: 2013-05-25 | Discharge: 2013-05-25 | Disposition: A | Discharge status: Home / Self Care | Payer: Self-pay | Attending: Emergency Medicine | Admitting: Emergency Medicine

## 2013-05-25 ENCOUNTER — Encounter (HOSPITAL_COMMUNITY): Payer: Self-pay | Admitting: Emergency Medicine

## 2013-05-25 DIAGNOSIS — Z8679 Personal history of other diseases of the circulatory system: Secondary | ICD-10-CM | POA: Insufficient documentation

## 2013-05-25 DIAGNOSIS — R300 Dysuria: Secondary | ICD-10-CM | POA: Insufficient documentation

## 2013-05-25 DIAGNOSIS — C9110 Chronic lymphocytic leukemia of B-cell type not having achieved remission: Secondary | ICD-10-CM | POA: Insufficient documentation

## 2013-05-25 DIAGNOSIS — Z79899 Other long term (current) drug therapy: Secondary | ICD-10-CM | POA: Insufficient documentation

## 2013-05-25 DIAGNOSIS — Z9861 Coronary angioplasty status: Secondary | ICD-10-CM | POA: Insufficient documentation

## 2013-05-25 DIAGNOSIS — R110 Nausea: Secondary | ICD-10-CM | POA: Insufficient documentation

## 2013-05-25 DIAGNOSIS — Z862 Personal history of diseases of the blood and blood-forming organs and certain disorders involving the immune mechanism: Secondary | ICD-10-CM | POA: Insufficient documentation

## 2013-05-25 DIAGNOSIS — IMO0001 Reserved for inherently not codable concepts without codable children: Secondary | ICD-10-CM | POA: Insufficient documentation

## 2013-05-25 DIAGNOSIS — R509 Fever, unspecified: Secondary | ICD-10-CM | POA: Insufficient documentation

## 2013-05-25 DIAGNOSIS — E042 Nontoxic multinodular goiter: Secondary | ICD-10-CM | POA: Insufficient documentation

## 2013-05-25 DIAGNOSIS — D72829 Elevated white blood cell count, unspecified: Secondary | ICD-10-CM | POA: Insufficient documentation

## 2013-05-25 DIAGNOSIS — Z8639 Personal history of other endocrine, nutritional and metabolic disease: Secondary | ICD-10-CM | POA: Insufficient documentation

## 2013-05-25 DIAGNOSIS — M549 Dorsalgia, unspecified: Secondary | ICD-10-CM | POA: Insufficient documentation

## 2013-05-25 DIAGNOSIS — R109 Unspecified abdominal pain: Secondary | ICD-10-CM | POA: Insufficient documentation

## 2013-05-25 DIAGNOSIS — R42 Dizziness and giddiness: Secondary | ICD-10-CM | POA: Insufficient documentation

## 2013-05-25 DIAGNOSIS — I10 Essential (primary) hypertension: Secondary | ICD-10-CM | POA: Insufficient documentation

## 2013-05-25 LAB — COMPREHENSIVE METABOLIC PANEL
ALT: 18 U/L (ref 0–35)
AST: 21 U/L (ref 0–37)
Alkaline Phosphatase: 104 U/L (ref 39–117)
CO2: 26 mEq/L (ref 19–32)
Calcium: 9.6 mg/dL (ref 8.4–10.5)
GFR calc non Af Amer: 51 mL/min — ABNORMAL LOW (ref >90)
Potassium: 4.5 mEq/L (ref 3.5–5.1)
Sodium: 134 mEq/L — ABNORMAL LOW (ref 135–145)

## 2013-05-25 LAB — CBC WITH DIFFERENTIAL/PLATELET
Band Neutrophils: 0 % (ref 0–10)
Blasts: 0 %
HCT: 36.8 % (ref 36.0–46.0)
MCH: 28 pg (ref 26.0–34.0)
MCHC: 32.9 g/dL (ref 30.0–36.0)
MCV: 85.2 fL (ref 78.0–100.0)
Metamyelocytes Relative: 0 %
Monocytes Absolute: 0 10*3/uL — ABNORMAL LOW (ref 0.1–1.0)
Myelocytes: 0 %
Platelets: 185 10*3/uL (ref 150–400)
Promyelocytes Absolute: 0 %
RDW: 13.3 % (ref 11.5–15.5)
nRBC: 0 /100 WBC

## 2013-05-25 LAB — TROPONIN I: Troponin I: <0.3 ng/mL (ref <0.30)

## 2013-05-25 LAB — URINE MICROSCOPIC-ADD ON

## 2013-05-25 LAB — URINALYSIS, ROUTINE W REFLEX MICROSCOPIC
Bilirubin Urine: NEGATIVE
Glucose, UA: NEGATIVE mg/dL
Protein, ur: NEGATIVE mg/dL
Specific Gravity, Urine: 1.018 (ref 1.005–1.030)

## 2013-05-25 MED ORDER — IOHEXOL 300 MG/ML  SOLN
50.0000 mL | Freq: Once | INTRAMUSCULAR | Status: AC | PRN
  Administered 2013-05-25: 50 mL via ORAL

## 2013-05-25 MED ORDER — IOHEXOL 300 MG/ML  SOLN
100.0000 mL | Freq: Once | INTRAMUSCULAR | Status: AC | PRN
  Administered 2013-05-25: 100 mL via INTRAVENOUS

## 2013-05-25 MED ORDER — MORPHINE SULFATE 4 MG/ML IJ SOLN
4.0000 mg | Freq: Once | INTRAMUSCULAR | Status: AC
  Administered 2013-05-25: 4 mg via INTRAVENOUS
  Filled 2013-05-25: qty 1

## 2013-05-25 MED ORDER — ONDANSETRON HCL 4 MG/2ML IJ SOLN
4.0000 mg | Freq: Once | INTRAMUSCULAR | Status: AC
  Administered 2013-05-25: 4 mg via INTRAVENOUS
  Filled 2013-05-25: qty 2

## 2013-05-25 MED ORDER — OXYCODONE-ACETAMINOPHEN 5-325 MG PO TABS: 2.0000 | ORAL_TABLET | ORAL | Status: DC | PRN

## 2013-05-25 MED ORDER — ONDANSETRON 4 MG PO TBDP: ORAL_TABLET | ORAL | Status: DC

## 2013-05-25 MED ORDER — TRAMADOL HCL 50 MG PO TABS: 50.0000 mg | ORAL_TABLET | Freq: Four times a day (QID) | ORAL | Status: DC | PRN

## 2013-05-25 NOTE — ED Notes (Signed)
Ranae Palms, MD, made aware of WBC count.

## 2013-05-25 NOTE — ED Notes (Signed)
Attempting to find working interpreter phone.

## 2013-05-25 NOTE — ED Notes (Addendum)
Patient reports to the emergency department for bladder area pain for 22 days radiating into back and increased pain with urination as well as urinary frequency. Patient reports that pain medication was making her dizzy, making her heart flutter and race, and that she had to stop taking this medicine. Patient denies dizziness currently, states that the dizziness and nausea caused by the oxycodone-acetaminophen ceased when the patient stopped taking the medication. Patient states she has had fevers off and on since last night and dry mouth. Patient reports that she has taken a course of bactrim. Denies N/V/D. Patient tearfully states that pain and urinary frequency have kept her from sleeping at night, which has become "unbearable."

## 2013-05-25 NOTE — ED Provider Notes (Signed)
CSN: [id number]     Arrival date & time 05/25/13  0940 History     First MD Initiated Contact with Patient 05/25/13 1020     Chief Complaint  Patient presents with  . Abdominal Pain   (Consider location/radiation/quality/duration/timing/severity/associated sxs/prior Treatment) HPI Pt with history of CLL present to ED for 22 days of persistent abdominal pain. Pt describes pain in suprapubic region and RUQ. +occasional nausea and pain with urination. She was seen in ED earlier this month and had non-contrasted CT central mesenteric lymph node swelling and questionable mesenteric lymphadenitis. Pt continues to have pain despite pain medication. +intermittent subjective fever and chills.  Past Medical History  Diagnosis Date  . Chronic lymphocytic leukemia     followed by Dr. Arline Asp  . Hyperlipidemia   . Hypertension   . Multinodular goiter     with hypothyroidism  . Cardiomyopathy     echo: (11/11) EF 25-30% with diffuse hypokinesis, mild left atrial enlargement. 11/11 HIV and ANA negative. TSH normal. LHC (1/12): EF 50-55% no angiographic CAD. Echo (2/12): EF 55-60% moderate LV hypertrophy, grade I diastolic dysfunction, mild MR, normal RV   Past Surgical History  Procedure Laterality Date  . Cardiac catheterization  2012    @ [place]   Family History  Problem Relation Age of Onset  . Heart attack Mother 16   History  Substance Use Topics  . Smoking status: Never Smoker   . Smokeless tobacco: Never Used  . Alcohol Use: No   OB History   Grav Para Term Preterm Abortions TAB SAB Ect Mult Living                 Review of Systems  Constitutional: Positive for fever and chills.  HENT: Negative for neck pain and neck stiffness.   Respiratory: Negative for cough and shortness of breath.   Cardiovascular: Negative for chest pain.  Gastrointestinal: Positive for nausea and abdominal pain. Negative for vomiting, diarrhea, constipation and blood in stool.  Genitourinary:  Positive for dysuria and flank pain. Negative for frequency and hematuria.  Musculoskeletal: Positive for myalgias and back pain. Negative for gait problem.  Skin: Negative for rash and wound.  Neurological: Positive for dizziness and light-headedness. Negative for weakness, numbness and headaches.  All other systems reviewed and are negative.    Allergies  Review of patient's allergies indicates no known allergies.  Home Medications   Current Outpatient Rx  Name  Route  Sig  Dispense  Refill  . carvedilol (COREG) 6.25 MG tablet   Oral   Take 6.25 mg by mouth 2 (two) times daily with a meal.         . levothyroxine (SYNTHROID, LEVOTHROID) 25 MCG tablet   Oral   Take 25 mcg by mouth daily before breakfast.           . lisinopril (PRINIVIL,ZESTRIL) 20 MG tablet   Oral   Take 20 mg by mouth daily.         Marland Kitchen OVER THE COUNTER MEDICATION   Oral   Take 1 capsule by mouth daily as needed (Urinary pain). Urical for         . oxyCODONE-acetaminophen (PERCOCET/ROXICET) 5-325 MG per tablet   Oral   Take 2 tablets by mouth every 4 (four) hours as needed for pain.   10 tablet   0   . sulfamethoxazole-trimethoprim (BACTRIM DS) 800-160 MG per tablet   Oral   Take 1 tablet by mouth 2 (two) times daily.  Started on 05-19-13 for 7 days.         . ondansetron (ZOFRAN ODT) 4 MG disintegrating tablet      4mg  ODT q4 hours prn nausea/vomit   8 tablet   0   . oxyCODONE-acetaminophen (PERCOCET) 5-325 MG per tablet   Oral   Take 2 tablets by mouth every 4 (four) hours as needed for pain.   20 tablet   0    BP 126/63  Pulse 67  Temp(Src) 99 F (37.2 C) (Oral)  Resp 18  SpO2 95% Physical Exam  Nursing note and vitals reviewed. Constitutional: She is oriented to person, place, and time. She appears well-developed and well-nourished. No distress.  HENT:  Head: Normocephalic and atraumatic.  Mouth/Throat: Oropharynx is clear and moist. No oropharyngeal exudate.  Eyes: EOM  are normal. Pupils are equal, round, and reactive to light.  Neck: Normal range of motion. Neck supple.  Cardiovascular: Normal rate and regular rhythm.  Exam reveals no gallop and no friction rub.   No murmur heard. Pulmonary/Chest: Effort normal and breath sounds normal. No respiratory distress. She has no wheezes. She has no rales. She exhibits no tenderness.  Abdominal: Soft. Bowel sounds are normal. There is tenderness (TTP with mild diffuse tenderness and moderate pain to deep palpation in RUQ and umbilical region. No rebound or guarding. ).  Musculoskeletal: Normal range of motion. She exhibits tenderness (bl lumbar paraspinal TTP. No midline tenderness). She exhibits no edema.  Neurological: She is alert and oriented to person, place, and time.  5/5 motor in all ext, sensation intact, normal gait.   Skin: Skin is warm and dry. No rash noted. No erythema.  Psychiatric: She has a normal mood and affect. Her behavior is normal.    ED Course   Procedures (including critical care time)  Labs Reviewed  CBC WITH DIFFERENTIAL - Abnormal; Notable for the following:    WBC 61.1 (*)    Neutrophils Relative % 10 (*)    Lymphocytes Relative 89 (*)    Monocytes Relative 0 (*)    Lymphs Abs 54.4 (*)    Monocytes Absolute 0.0 (*)    Basophils Absolute 0.6 (*)    All other components within normal limits  COMPREHENSIVE METABOLIC PANEL - Abnormal; Notable for the following:    Sodium 134 (*)    BUN 24 (*)    Creatinine, Ser 1.20 (*)    Total Bilirubin 0.2 (*)    GFR calc non Af Amer 51 (*)    GFR calc Af Amer 60 (*)    All other components within normal limits  URINALYSIS, ROUTINE W REFLEX MICROSCOPIC - Abnormal; Notable for the following:    Hgb urine dipstick TRACE (*)    Leukocytes, UA SMALL (*)    All other components within normal limits  LIPASE, BLOOD  TROPONIN I  URINE MICROSCOPIC-ADD ON  PATHOLOGIST SMEAR REVIEW   US Abdomen Complete  05/25/2013   *RADIOLOGY REPORT*   Clinical Data:  Right upper quadrant abdominal pain.  History of chronic lymphocytic leukemia and hypertension.  COMPLETE ABDOMINAL ULTRASOUND  Comparison:  Abdominal CT 05/18/2013.  Findings:  Gallbladder: The gallbladder is incompletely distended.  This may account for mild wall thickening to 3.7 mm.  No gallstones or pericholecystic fluid identified.  Sonographic Murphy's sign is absent.  Common bile duct:   Normal in caliber without filling defects.  Liver:  Echogenicity is within normal limits.  No focal hepatic abnormalities are identified.  IVC:  The  visualized portions are unremarkable.  The intrahepatic IVC is not well visualized.  Pancreas:  Visualized portions appear unremarkable.The pancreatic tail is obscured by bowel gas.  Spleen:  Small splenic granulomas are noted.  There is borderline splenomegaly with the spleen measuring 12.8 cm in length and having an estimated volume of 494 ml.  Right Kidney:   The renal cortical thickness and echogenicity are preserved.  There is no hydronephrosis or focal abnormality. Renal length is 10.5 cm.  Left Kidney:   The renal cortical thickness and echogenicity are preserved.  There is no hydronephrosis or focal abnormality. Renal length is 10.5 cm.  Abdominal aorta:  Visualized portions appear unremarkable.  IMPRESSION:  1.  No definite acute abdominal findings. 2.  Mild gallbladder wall thickening may be secondary to incomplete distension.  No gallstones or sonographic Murphy's sign identified. 3.  Borderline splenomegaly, similar to recent CT.   Original Report Authenticated By: Carey Bullocks, M.D.   Ct Abdomen Pelvis W Contrast  05/25/2013   *RADIOLOGY REPORT*  Clinical Data: Pain; history of chronic lymphocytic leukemia  CT ABDOMEN AND PELVIS WITH CONTRAST  Technique:  Multidetector CT imaging of the abdomen and pelvis was performed following the standard protocol during bolus administration of oral and intravenous contrast.  Contrast:  OMNIPAQUE  IOHEXOL 300 MG/ML  SOLN  Comparison:  May 18, 2013  Findings: There is a small calcified granuloma the right lung base. The lung bases are otherwise clear.  No focal liver lesions are identified.  There is no biliary duct dilatation.  Spleen is mildly prominent measuring 13 cm in length. There are calcified splenic granulomas.  No splenic masses are identified.  Pancreas and adrenals appear normal.  Kidneys bilaterally show no mass or hydronephrosis on either side.  In the pelvis, there is no mass or fluid collection.  Appendix region appears normal.  There is no bowel obstruction.  No free air or portal venous air.  Mesenteric lymph nodes are again noted throughout the mid abdomen. The largest individual lymph node is seen in the mid abdomen on slice 37 measuring 1.6 x 1.6 cm.  No new adenopathy is seen compared to recent prior study.  There is no ascites or abscess in the abdomen or pelvis.  Aorta is nonaneurysmal.  There are no blastic or lytic bone lesions.  There is lumbar levoscoliosis.  IMPRESSION: Stable mesenteric lymph node prominence.  No new adenopathy.  Evidence of prior granulomatous disease.  Spleen is mildly prominent.  No mesenteric inflammation or abscess.  No bowel obstruction. Appendix region appears within normal limits.   Original Report Authenticated By: Bretta Bang, M.D.   1. Abdominal pain   2. Leukocytosis     MDM  Dsicussed with Oncology MD, Dr Roanna Raider. Does not believe symptoms related to CLL. Advised pain control and to call into office to be followed up and possible GI follow up.  Long discussion with Pt and husband through interpreter phone regarding results of testing and oncology recommendation.  All questions answered. Agree with plan. Pt appears comfortable.   Loren Racer, MD 05/25/13 [phone]

## 2013-05-25 NOTE — ED Notes (Signed)
Katherine G. Used for interpretation.

## 2013-05-26 ENCOUNTER — Telehealth: Payer: Self-pay | Admitting: *Deleted

## 2013-05-26 NOTE — Telephone Encounter (Signed)
Call received from [facility] with patient's PCP asking for lab results from ED visit on 05-25-2013.  Provider does not have access to [facility], asking when next f/u appointment is scheduled and if it can be moved to an earlier date.  Patient is very anxious, does not speak [place].  Informed Emily's wbc = 61.1K and remains stable.  No change in appointment will be made.  Irving Burton will notify patient the reading is less than when it was checked with them on 7-3-214.

## 2013-06-06 ENCOUNTER — Telehealth: Payer: Self-pay | Admitting: *Deleted

## 2013-06-06 NOTE — Telephone Encounter (Signed)
Tried to call the pt to inform her that [facility] will be out of the office on 06/24/13. i will mail a letter/avs giving her appt d/t for 07/01/13 labs@ 1pm and 1:30pm ov...td

## 2013-06-24 ENCOUNTER — Ambulatory Visit: Payer: Self-pay | Admitting: Oncology

## 2013-06-24 ENCOUNTER — Other Ambulatory Visit: Payer: Self-pay | Admitting: Lab

## 2013-07-01 ENCOUNTER — Ambulatory Visit (HOSPITAL_BASED_OUTPATIENT_CLINIC_OR_DEPARTMENT_OTHER): Payer: Self-pay | Admitting: Oncology

## 2013-07-01 ENCOUNTER — Telehealth: Payer: Self-pay | Admitting: Oncology

## 2013-07-01 ENCOUNTER — Other Ambulatory Visit: Payer: Self-pay | Admitting: Lab

## 2013-07-01 VITALS — BP 160/80 | HR 74 | Temp 97.9°F | Resp 18 | Ht 61.0 in | Wt 193.8 lb

## 2013-07-01 DIAGNOSIS — C9110 Chronic lymphocytic leukemia of B-cell type not having achieved remission: Secondary | ICD-10-CM

## 2013-07-01 NOTE — Progress Notes (Signed)
Hematology and Oncology Follow Up Visit  Katherine Terrell [id number] 1960/12/17 52 y.o. 07/01/2013 2:00 PM No PCP Per PatientNo ref. provider found   Principle Diagnosis: 52 year old woman with stage 0 CLL diagnosed in 2011. Presented with lymphocytosis and mild splenomegaly.  Current therapy: Observation and surveillance.  Interim History:  Patient presents today for a followup visit accompanied by an interpreter today. She is reporting doing relatively the same since her last visit. She is routinely followed by Dr. Arline Asp who has since retired. She is not reporting any fevers or chills or sweats. Is not reporting any lymphadenopathy or constitutional symptoms. She is intentionally trying to lose weight and have done so losing 7 pounds. She does continue to have a reasonable performance status without any hindrance or decline. She is not reporting any bulky adenopathy or change in her bowel habits.  Medications: I have reviewed the patient's current medications.  Current Outpatient Prescriptions  Medication Sig Dispense Refill  . carvedilol (COREG) 6.25 MG tablet Take 6.25 mg by mouth 2 (two) times daily with a meal.      . levothyroxine (SYNTHROID, LEVOTHROID) 25 MCG tablet Take 25 mcg by mouth daily before breakfast.        . lisinopril (PRINIVIL,ZESTRIL) 20 MG tablet Take 20 mg by mouth daily.      . ondansetron (ZOFRAN ODT) 4 MG disintegrating tablet 4mg  ODT q4 hours prn nausea/vomit  8 tablet  0  . OVER THE COUNTER MEDICATION Take 1 capsule by mouth daily as needed (Urinary pain). Urical for      . oxyCODONE-acetaminophen (PERCOCET) 5-325 MG per tablet Take 2 tablets by mouth every 4 (four) hours as needed for pain.  20 tablet  0  . oxyCODONE-acetaminophen (PERCOCET/ROXICET) 5-325 MG per tablet Take 2 tablets by mouth every 4 (four) hours as needed for pain.  10 tablet  0  . sulfamethoxazole-trimethoprim (BACTRIM DS) 800-160 MG per tablet Take 1 tablet by mouth 2 (two) times daily. Started  on 05-19-13 for 7 days.      . traMADol (ULTRAM) 50 MG tablet Take 1 tablet (50 mg total) by mouth every 6 (six) hours as needed for pain.  15 tablet  0   No current facility-administered medications for this visit.     Allergies: No Known Allergies  Past Medical History, Surgical history, Social history, and Family History were reviewed and updated.  Review of Systems:  Remaining ROS negative. Physical Exam: Blood pressure 160/80, pulse 74, temperature 97.9 F (36.6 C), temperature source Oral, resp. rate 18, height 5\' 1"  (1.549 m), weight 193 lb 12.8 oz (87.907 kg), SpO2 97.00%. ECOG: 0 General appearance: alert Head: Normocephalic, without obvious abnormality, atraumatic Neck: no adenopathy, no carotid bruit, no JVD, supple, symmetrical, trachea midline and thyroid not enlarged, symmetric, no tenderness/mass/nodules Lymph nodes: Cervical, supraclavicular, and axillary nodes normal. Heart:regular rate and rhythm, S1, S2 normal, no murmur, click, rub or gallop Lung:chest clear, no wheezing, rales, normal symmetric air entry Abdomin: soft, non-tender, without masses or organomegaly EXT:no erythema, induration, or nodules   Lab Results: Lab Results  Component Value Date   WBC 61.1* 05/25/2013   HGB 12.1 05/25/2013   HCT 36.8 05/25/2013   MCV 85.2 05/25/2013   PLT 185 05/25/2013     Chemistry      Component Value Date/Time   NA 134* 05/25/2013 1221   NA 142 12/24/2012 1332   K 4.5 05/25/2013 1221   K 3.6 12/24/2012 1332   CL 100 05/25/2013 1221  CL 107 12/24/2012 1332   CO2 26 05/25/2013 1221   CO2 27 12/24/2012 1332   BUN 24* 05/25/2013 1221   BUN 14.6 12/24/2012 1332   CREATININE 1.20* 05/25/2013 1221   CREATININE 1.1 12/24/2012 1332   CREATININE 1.05 02/24/2011 1633      Component Value Date/Time   CALCIUM 9.6 05/25/2013 1221   CALCIUM 8.9 12/24/2012 1332   ALKPHOS 104 05/25/2013 1221   ALKPHOS 101 12/24/2012 1332   AST 21 05/25/2013 1221   AST 20 12/24/2012 1332   ALT 18  05/25/2013 1221   ALT 19 12/24/2012 1332   BILITOT 0.2* 05/25/2013 1221   BILITOT 0.30 12/24/2012 1332       Impression and Plan:  52 year old woman with the following issues:  1. Stage 0 CLL presented with a lymphocytosis. Her white cell count slightly elevated in July of 2014 but have not dramatically changed the last 3 years. She is completely asymptomatic I see no reason for any intervention or indication for treatments. I have educated her today about potential complications from CLL as well as indication for treatments and I see none of them presents at this time. I have explained to her rapid doubling time of her white cell count, symptomatic lymphadenopathy, constitutional symptoms of weight loss or fevers or chills, autoimmune cytopenias are all possible indication for treatment and right now she has not.  2. Followup: Will be in 6 months.  Eli Hose, MD 9/2/20142:00 PM

## 2013-07-01 NOTE — Telephone Encounter (Signed)
gv and printed appt sched and avs forpt for March 2015  °

## 2013-12-30 ENCOUNTER — Other Ambulatory Visit: Payer: Self-pay

## 2013-12-30 ENCOUNTER — Ambulatory Visit: Payer: Self-pay | Admitting: Oncology

## 2013-12-31 ENCOUNTER — Telehealth: Payer: Self-pay | Admitting: Oncology

## 2013-12-31 NOTE — Telephone Encounter (Signed)
pt came in for appt on wrong day. communicated w/pt using pacfice inter[reter via telephone. pt appt r/s to 3/31 lb/fu @ 3pm and pt given new schedule.

## 2014-01-15 ENCOUNTER — Other Ambulatory Visit: Payer: Self-pay | Admitting: Obstetrics and Gynecology

## 2014-01-15 DIAGNOSIS — Z1231 Encounter for screening mammogram for malignant neoplasm of breast: Secondary | ICD-10-CM

## 2014-01-27 ENCOUNTER — Encounter: Payer: Self-pay | Admitting: Oncology

## 2014-01-27 ENCOUNTER — Ambulatory Visit (HOSPITAL_BASED_OUTPATIENT_CLINIC_OR_DEPARTMENT_OTHER): Payer: Self-pay | Admitting: Oncology

## 2014-01-27 ENCOUNTER — Telehealth: Payer: Self-pay | Admitting: Oncology

## 2014-01-27 ENCOUNTER — Other Ambulatory Visit (HOSPITAL_BASED_OUTPATIENT_CLINIC_OR_DEPARTMENT_OTHER): Payer: Self-pay

## 2014-01-27 VITALS — BP 120/54 | HR 79 | Temp 98.0°F | Resp 18 | Ht 61.0 in | Wt 202.1 lb

## 2014-01-27 DIAGNOSIS — C9110 Chronic lymphocytic leukemia of B-cell type not having achieved remission: Secondary | ICD-10-CM

## 2014-01-27 LAB — CBC WITH DIFFERENTIAL/PLATELET
BASO%: 0.2 % (ref 0.0–2.0)
Basophils Absolute: 0.2 10*3/uL — ABNORMAL HIGH (ref 0.0–0.1)
EOS ABS: 0.3 10*3/uL (ref 0.0–0.5)
EOS%: 0.4 % (ref 0.0–7.0)
HCT: 41.6 % (ref 34.8–46.6)
HGB: 12.9 g/dL (ref 11.6–15.9)
LYMPH%: 89.8 % — AB (ref 14.0–49.7)
MCH: 26.8 pg (ref 25.1–34.0)
MCHC: 31 g/dL — AB (ref 31.5–36.0)
MCV: 86.3 fL (ref 79.5–101.0)
MONO#: 2.2 10*3/uL — ABNORMAL HIGH (ref 0.1–0.9)
MONO%: 2.7 % (ref 0.0–14.0)
NEUT%: 6.9 % — ABNORMAL LOW (ref 38.4–76.8)
NEUTROS ABS: 5.7 10*3/uL (ref 1.5–6.5)
PLATELETS: 234 10*3/uL (ref 145–400)
RBC: 4.82 10*6/uL (ref 3.70–5.45)
RDW: 13.4 % (ref 11.2–14.5)
WBC: 82.2 10*3/uL (ref 3.9–10.3)
lymph#: 73.8 10*3/uL — ABNORMAL HIGH (ref 0.9–3.3)

## 2014-01-27 LAB — COMPREHENSIVE METABOLIC PANEL (CC13)
ALBUMIN: 3.9 g/dL (ref 3.5–5.0)
ALK PHOS: 119 U/L (ref 40–150)
ALT: 10 U/L (ref 0–55)
AST: 18 U/L (ref 5–34)
Anion Gap: 10 mEq/L (ref 3–11)
BILIRUBIN TOTAL: 0.28 mg/dL (ref 0.20–1.20)
BUN: 17.9 mg/dL (ref 7.0–26.0)
CO2: 25 mEq/L (ref 22–29)
Calcium: 9.4 mg/dL (ref 8.4–10.4)
Chloride: 106 mEq/L (ref 98–109)
Creatinine: 1.2 mg/dL — ABNORMAL HIGH (ref 0.6–1.1)
GLUCOSE: 94 mg/dL (ref 70–140)
POTASSIUM: 4.1 meq/L (ref 3.5–5.1)
SODIUM: 140 meq/L (ref 136–145)
TOTAL PROTEIN: 7.5 g/dL (ref 6.4–8.3)

## 2014-01-27 LAB — TECHNOLOGIST REVIEW

## 2014-01-27 NOTE — Progress Notes (Signed)
Hematology and Oncology Follow Up Visit  Katherine Terrell [id number] July 01, 1961 53 y.o. 01/27/2014 3:45 PM No PCP Per PatientNo ref. provider found   Principle Diagnosis: 53 year old woman with stage 0 CLL diagnosed in 2011. Presented with lymphocytosis and mild splenomegaly.  Current therapy: Observation and surveillance.  Interim History:  Patient presents today for a followup visit accompanied by an interpreter today. She is reporting doing the same since her last visit. She reports a constellation of symptoms that are unrelated to her condition. She reported symptoms of back pain, urinary tract infection and sore throat and mostly have resolved. She is not reporting any fevers or chills or sweats. Is not reporting any lymphadenopathy or constitutional symptoms. She has gained weight since her last visit. She does continue to have a reasonable performance status without any hindrance or decline. She is not reporting any bulky adenopathy or change in her bowel habits.  Medications: I have reviewed the patient's current medications.  Current Outpatient Prescriptions  Medication Sig Dispense Refill  . carvedilol (COREG) 6.25 MG tablet Take 6.25 mg by mouth 2 (two) times daily with a meal.      . levothyroxine (SYNTHROID, LEVOTHROID) 25 MCG tablet Take 25 mcg by mouth daily before breakfast.        . lisinopril (PRINIVIL,ZESTRIL) 20 MG tablet Take 20 mg by mouth daily.      . ondansetron (ZOFRAN ODT) 4 MG disintegrating tablet 4mg  ODT q4 hours prn nausea/vomit  8 tablet  0  . OVER THE COUNTER MEDICATION Take 1 capsule by mouth daily as needed (Urinary pain). Urical for      . oxyCODONE-acetaminophen (PERCOCET) 5-325 MG per tablet Take 2 tablets by mouth every 4 (four) hours as needed for pain.  20 tablet  0  . oxyCODONE-acetaminophen (PERCOCET/ROXICET) 5-325 MG per tablet Take 2 tablets by mouth every 4 (four) hours as needed for pain.  10 tablet  0  . sulfamethoxazole-trimethoprim (BACTRIM DS)  800-160 MG per tablet Take 1 tablet by mouth 2 (two) times daily. Started on 05-19-13 for 7 days.      . traMADol (ULTRAM) 50 MG tablet Take 1 tablet (50 mg total) by mouth every 6 (six) hours as needed for pain.  15 tablet  0   No current facility-administered medications for this visit.     Allergies: No Known Allergies  Past Medical History, Surgical history, Social history, and Family History were reviewed and updated.  Review of Systems:  Remaining ROS negative. Physical Exam: Blood pressure 120/54, pulse 79, temperature 98 F (36.7 C), temperature source Oral, resp. rate 18, height 5\' 1"  (1.549 m), weight 202 lb 1.6 oz (91.672 kg), SpO2 97.00%. ECOG: 0 General appearance: alert awake not in any distress. Head: Normocephalic, without obvious abnormality, atraumatic Neck: no adenopathy, no carotid bruit, no JVD, supple, symmetrical, trachea midline and thyroid not enlarged, symmetric, no tenderness/mass/nodules Lymph nodes: Cervical, supraclavicular, and axillary nodes normal. Heart:regular rate and rhythm, S1, S2 normal, no murmur, click, rub or gallop Lung:chest clear, no wheezing, rales, normal symmetric air entry Abdomin: soft, non-tender, without masses or organomegaly EXT:no erythema, induration, or nodules   Lab Results: Lab Results  Component Value Date   WBC 82.2* 01/27/2014   HGB 12.9 01/27/2014   HCT 41.6 01/27/2014   MCV 86.3 01/27/2014   PLT 234 01/27/2014     Chemistry      Component Value Date/Time   NA 134* 05/25/2013 1221   NA 142 12/24/2012 1332   K 4.5 05/25/2013  1221   K 3.6 12/24/2012 1332   CL 100 05/25/2013 1221   CL 107 12/24/2012 1332   CO2 26 05/25/2013 1221   CO2 27 12/24/2012 1332   BUN 24* 05/25/2013 1221   BUN 14.6 12/24/2012 1332   CREATININE 1.20* 05/25/2013 1221   CREATININE 1.1 12/24/2012 1332   CREATININE 1.05 02/24/2011 1633      Component Value Date/Time   CALCIUM 9.6 05/25/2013 1221   CALCIUM 8.9 12/24/2012 1332   ALKPHOS 104 05/25/2013  1221   ALKPHOS 101 12/24/2012 1332   AST 21 05/25/2013 1221   AST 20 12/24/2012 1332   ALT 18 05/25/2013 1221   ALT 19 12/24/2012 1332   BILITOT 0.2* 05/25/2013 1221   BILITOT 0.30 12/24/2012 1332       Impression and Plan:  53 year old woman with the following issues:  1. Stage 0 CLL presented with a lymphocytosis. Her white cell count slightly elevated in July of 2014 but have not dramatically changed the last 3 years. She is  asymptomatic and I see no reason for any intervention or indication for treatments. I have educated her today about potential complications from CLL as well as indication for treatments and I see none of them presents at this time. I have explained to her rapid doubling time of her white cell count, symptomatic lymphadenopathy, constitutional symptoms of weight loss or fevers or chills, autoimmune cytopenias are all possible indication for treatment and right now she has not.  2. Followup: Will be in 6 months.  [facility], MD 3/31/20153:45 PM

## 2014-01-27 NOTE — Telephone Encounter (Signed)
[facility] PT APPT SCHEDULE FOR SEPT °

## 2014-02-10 ENCOUNTER — Ambulatory Visit (HOSPITAL_COMMUNITY)
Admission: RE | Admit: 2014-02-10 | Discharge: 2014-02-10 | Disposition: A | Discharge status: Home / Self Care | Payer: Self-pay | Source: Ambulatory Visit | Attending: Obstetrics and Gynecology | Admitting: Obstetrics and Gynecology

## 2014-02-10 ENCOUNTER — Encounter (HOSPITAL_COMMUNITY): Payer: Self-pay

## 2014-02-10 ENCOUNTER — Ambulatory Visit (HOSPITAL_COMMUNITY): Payer: Self-pay

## 2014-02-10 VITALS — BP 116/74 | Temp 97.9°F | Ht 62.0 in | Wt 205.2 lb

## 2014-02-10 DIAGNOSIS — Z1239 Encounter for other screening for malignant neoplasm of breast: Secondary | ICD-10-CM

## 2014-02-10 DIAGNOSIS — Z1231 Encounter for screening mammogram for malignant neoplasm of breast: Secondary | ICD-10-CM

## 2014-02-10 NOTE — Patient Instructions (Signed)
Taught Katherine Terrell how to perform BSE and gave educational materials to take home. Patient did not need a Pap smear today due to last Pap smear was in October 2014 per patient. Let her know [facility]CP will cover Pap smears every 3 years unless has a history of abnormal Pap smears. Let [facility] know [facility] will [facility] with her within the next couple weeks with results by letter or phone. Katherine Terrell verbalized understanding. Patient escorted to mammography for a screening mammogram.  Shirley Muscat, RN 4:13 PM

## 2014-02-10 NOTE — Progress Notes (Signed)
No complaints today.  Pap Smear:  Pap smear not completed today. Last Pap smear was in October 2014 at [facility] in [place] and normal per patient. Per patient has no history of an abnormal Pap smear. No Pap smear results in EPIC.  Physical exam: Breasts Breasts symmetrical. No skin abnormalities bilateral breasts. No nipple retraction bilateral breasts. No nipple discharge bilateral breasts. No lymphadenopathy. No lumps palpated bilateral breasts. Complaints of bilateral outer breast tenderness on exam. Patient escorted to mammography for a screening mammogram.        Pelvic/Bimanual No Pap smear completed today since last Pap smear was in October 2014 per patient. Pap smear not indicated per BCCCP guidelines.

## 2014-03-13 ENCOUNTER — Other Ambulatory Visit: Payer: Self-pay

## 2014-03-13 ENCOUNTER — Ambulatory Visit: Payer: Self-pay

## 2014-05-11 ENCOUNTER — Telehealth: Payer: Self-pay | Admitting: Medical Oncology

## 2014-05-11 NOTE — Telephone Encounter (Signed)
Interpretor, Almyra Free, called for patient, patient requesting to know when next appt is. Per interpretor patient went to urgent care on [address] (unaware of date) due to possible UTI and patient had labs drawn, patient concerned d/t lab results. Almyra Free not sure what the results are, expressed patient concerned about the results. Asked Almyra Free to have patient request labs to be faxed to our office. Informed Almyra Free MD out of office today but will inform him of patient's concerns and once fax of labs received will give to MD for review. Almyra Free states patient reporting "fatigue and elevated temperature at night, feels hot."   Patient scheduled for f/u with lab/MD 09/30  MD inboxed for review, upon receipt of labs will give to MD for review.

## 2014-05-12 ENCOUNTER — Encounter: Payer: Self-pay | Admitting: Medical Oncology

## 2014-05-12 NOTE — Progress Notes (Signed)
Labs received via fax from the [facility] and given to MD this am for review.  Per MD, patient to keep scheduled appt Sept 30, 2015  Call to interpretor, Almyra Free, to inform patient that MD reviewed labs from Clinic and labs reflect pt diagnosis and patient to keep appts as scheduled. Patient to call office/clinic should she have any issues or concerns. Verbal understanding given and Almyra Free will relay message to patient.

## 2014-07-29 ENCOUNTER — Encounter (HOSPITAL_COMMUNITY): Payer: Self-pay | Admitting: Emergency Medicine

## 2014-07-29 ENCOUNTER — Emergency Department (HOSPITAL_COMMUNITY)
Admission: EM | Admit: 2014-07-29 | Discharge: 2014-07-29 | Disposition: A | Discharge status: Home / Self Care | Payer: Self-pay | Attending: Emergency Medicine | Admitting: Emergency Medicine

## 2014-07-29 ENCOUNTER — Other Ambulatory Visit (HOSPITAL_BASED_OUTPATIENT_CLINIC_OR_DEPARTMENT_OTHER): Payer: Self-pay

## 2014-07-29 ENCOUNTER — Encounter: Payer: Self-pay | Admitting: Oncology

## 2014-07-29 ENCOUNTER — Ambulatory Visit (HOSPITAL_BASED_OUTPATIENT_CLINIC_OR_DEPARTMENT_OTHER): Payer: Self-pay | Admitting: Oncology

## 2014-07-29 ENCOUNTER — Emergency Department (HOSPITAL_COMMUNITY): Payer: Self-pay

## 2014-07-29 VITALS — BP 135/57 | HR 52 | Temp 98.6°F | Resp 19 | Ht 62.0 in | Wt 192.9 lb

## 2014-07-29 DIAGNOSIS — I10 Essential (primary) hypertension: Secondary | ICD-10-CM | POA: Insufficient documentation

## 2014-07-29 DIAGNOSIS — M5431 Sciatica, right side: Secondary | ICD-10-CM

## 2014-07-29 DIAGNOSIS — E042 Nontoxic multinodular goiter: Secondary | ICD-10-CM | POA: Insufficient documentation

## 2014-07-29 DIAGNOSIS — M25559 Pain in unspecified hip: Secondary | ICD-10-CM | POA: Insufficient documentation

## 2014-07-29 DIAGNOSIS — M5430 Sciatica, unspecified side: Secondary | ICD-10-CM | POA: Insufficient documentation

## 2014-07-29 DIAGNOSIS — C9110 Chronic lymphocytic leukemia of B-cell type not having achieved remission: Secondary | ICD-10-CM

## 2014-07-29 DIAGNOSIS — Z79899 Other long term (current) drug therapy: Secondary | ICD-10-CM | POA: Insufficient documentation

## 2014-07-29 LAB — COMPREHENSIVE METABOLIC PANEL (CC13)
ALT: 9 U/L (ref 0–55)
ANION GAP: 7 meq/L (ref 3–11)
AST: 17 U/L (ref 5–34)
Albumin: 3.8 g/dL (ref 3.5–5.0)
Alkaline Phosphatase: 108 U/L (ref 40–150)
BILIRUBIN TOTAL: 0.35 mg/dL (ref 0.20–1.20)
BUN: 20.9 mg/dL (ref 7.0–26.0)
CALCIUM: 9.3 mg/dL (ref 8.4–10.4)
CHLORIDE: 106 meq/L (ref 98–109)
CO2: 28 meq/L (ref 22–29)
CREATININE: 1.2 mg/dL — AB (ref 0.6–1.1)
Glucose: 94 mg/dl (ref 70–140)
Potassium: 3.7 mEq/L (ref 3.5–5.1)
Sodium: 140 mEq/L (ref 136–145)
Total Protein: 7.2 g/dL (ref 6.4–8.3)

## 2014-07-29 LAB — CBC WITH DIFFERENTIAL/PLATELET
BASO%: 0.1 % (ref 0.0–2.0)
BASOS ABS: 0.1 10*3/uL (ref 0.0–0.1)
EOS%: 0.3 % (ref 0.0–7.0)
Eosinophils Absolute: 0.3 10*3/uL (ref 0.0–0.5)
HEMATOCRIT: 38.1 % (ref 34.8–46.6)
HEMOGLOBIN: 11.5 g/dL — AB (ref 11.6–15.9)
LYMPH#: 88.2 10*3/uL — AB (ref 0.9–3.3)
LYMPH%: 92.4 % — AB (ref 14.0–49.7)
MCH: 26.5 pg (ref 25.1–34.0)
MCHC: 30.1 g/dL — AB (ref 31.5–36.0)
MCV: 88.1 fL (ref 79.5–101.0)
MONO#: 0.8 10*3/uL (ref 0.1–0.9)
MONO%: 0.8 % (ref 0.0–14.0)
NEUT#: 6.1 10*3/uL (ref 1.5–6.5)
NEUT%: 6.4 % — AB (ref 38.4–76.8)
PLATELETS: 179 10*3/uL (ref 145–400)
RBC: 4.33 10*6/uL (ref 3.70–5.45)
RDW: 14.4 % (ref 11.2–14.5)
WBC: 95.5 10*3/uL (ref 3.9–10.3)

## 2014-07-29 LAB — TECHNOLOGIST REVIEW

## 2014-07-29 MED ORDER — KETOROLAC TROMETHAMINE 60 MG/2ML IM SOLN
60.0000 mg | Freq: Once | INTRAMUSCULAR | Status: AC
  Administered 2014-07-29: 60 mg via INTRAMUSCULAR
  Filled 2014-07-29: qty 2

## 2014-07-29 MED ORDER — OXYCODONE-ACETAMINOPHEN 7.5-325 MG PO TABS: 1.0000 | ORAL_TABLET | ORAL | Status: DC | PRN

## 2014-07-29 MED ORDER — DIAZEPAM 5 MG PO TABS
5.0000 mg | ORAL_TABLET | Freq: Once | ORAL | Status: AC
  Administered 2014-07-29: 5 mg via ORAL
  Filled 2014-07-29: qty 1

## 2014-07-29 MED ORDER — DIAZEPAM 2 MG PO TABS: 2.0000 mg | ORAL_TABLET | ORAL | Status: DC | PRN

## 2014-07-29 MED ORDER — HYDROMORPHONE HCL 1 MG/ML IJ SOLN
1.0000 mg | Freq: Once | INTRAMUSCULAR | Status: AC
  Administered 2014-07-29: 1 mg via INTRAMUSCULAR
  Filled 2014-07-29: qty 1

## 2014-07-29 NOTE — ED Provider Notes (Signed)
CSN: [id number]     Arrival date & time 07/29/14  1601 History   First MD Initiated Contact with Patient 07/29/14 1712     Chief Complaint  Patient presents with  . Hip Pain  . sent from cancer center     (Consider location/radiation/quality/duration/timing/severity/associated sxs/prior Treatment) HPI Comments: Patient here complaining of pain at her right sciatic notch x2 days. Denies any history of trauma. Pain radiates down to her foot. Denies any back pain. Saw her oncologist today and reviewed that note. Blood work also obtained and shows no evidence of hypercalcemia. She does have a history of CLL. Denies any change in bowel or bladder function. Symptoms persistent and worse with movement and better with rest. No prior history of same.  Patient is a 53 y.o. female presenting with hip pain. The history is provided by the patient.  Hip Pain    Past Medical History  Diagnosis Date  . Chronic lymphocytic leukemia     followed by Dr. Ralene Ok  . Hyperlipidemia   . Hypertension   . Multinodular goiter     with hypothyroidism  . Cardiomyopathy     echo: (11/11) EF 25-30% with diffuse hypokinesis, mild left atrial enlargement. 11/11 HIV and ANA negative. TSH normal. LHC (1/12): EF 50-55% no angiographic CAD. Echo (2/12): EF 55-60% moderate LV hypertrophy, grade I diastolic dysfunction, mild MR, normal RV   Past Surgical History  Procedure Laterality Date  . Cardiac catheterization  2012    @ [facility]   Family History  Problem Relation Age of Onset  . Heart attack Mother 57   History  Substance Use Topics  . Smoking status: Never Smoker   . Smokeless tobacco: Never Used  . Alcohol Use: No   OB History   Grav Para Term Preterm Abortions TAB SAB Ect Mult Living   3 3 3       3      Review of Systems  All other systems reviewed and are negative.     Allergies  Tramadol  Home Medications   Prior to Admission medications   Medication Sig Start Date End Date  Taking? Authorizing Provider  acetaminophen (TYLENOL) 325 MG tablet Take 650 mg by mouth every 6 (six) hours as needed.   Yes Historical Provider, MD  carvedilol (COREG) 6.25 MG tablet Take 6.25 mg by mouth 2 (two) times daily with a meal.   Yes Historical Provider, MD  levothyroxine (SYNTHROID, LEVOTHROID) 25 MCG tablet Take 25 mcg by mouth daily before breakfast.     Yes Historical Provider, MD  lisinopril (PRINIVIL,ZESTRIL) 20 MG tablet Take 20 mg by mouth daily.   Yes Historical Provider, MD   BP 139/86  Pulse 79  Temp(Src) 98.4 F (36.9 C) (Oral)  Resp 17  SpO2 97% Physical Exam  Nursing note and vitals reviewed. Constitutional: She is oriented to person, place, and time. She appears well-developed and well-nourished.  Non-toxic appearance. No distress.  HENT:  Head: Normocephalic and atraumatic.  Eyes: Conjunctivae, EOM and lids are normal. Pupils are equal, round, and reactive to light.  Neck: Normal range of motion. Neck supple. No tracheal deviation present. No mass present.  Cardiovascular: Normal rate, regular rhythm and normal heart sounds.  Exam reveals no gallop.   No murmur heard. Pulmonary/Chest: Effort normal and breath sounds normal. No stridor. No respiratory distress. She has no decreased breath sounds. She has no wheezes. She has no rhonchi. She has no rales.  Abdominal: Soft. Normal appearance and bowel  sounds are normal. She exhibits no distension. There is no tenderness. There is no rebound and no CVA tenderness.  Musculoskeletal: Normal range of motion. She exhibits no edema and no tenderness.       Legs: Neurological: She is alert and oriented to person, place, and time. She has normal strength. No cranial nerve deficit or sensory deficit. GCS eye subscore is 4. GCS verbal subscore is 5. GCS motor subscore is 6.  Skin: Skin is warm and dry. No abrasion and no rash noted.  Psychiatric: She has a normal mood and affect. Her speech is normal and behavior is normal.      ED Course  Procedures (including critical care time) Labs Review Labs Reviewed - No data to display  Imaging Review No results found.   EKG Interpretation None      MDM   Final diagnoses:  None   Hip x-rays negative. Suspect that patient has sciatica. Pain meds given she feels better and will treat symptomatically    Leota Jacobsen, MD 07/29/14 2008

## 2014-07-29 NOTE — Progress Notes (Signed)
Hematology and Oncology Follow Up Visit  Katherine Terrell [id number] 18-Apr-1961 53 y.o. 07/29/2014 4:12 PM No PCP Per PatientNo ref. provider found   Principle Diagnosis: 53 year old woman with stage 0 CLL diagnosed in 2011. Presented with lymphocytosis and mild splenomegaly.  Current therapy: Observation and surveillance.  Interim History:  Patient presents today for a followup visit accompanied by an interpreter today.  Since her last visit she has done reasonably well so she started developing right-sided hip pain particularly in the last 48 hours. She does not report any trauma or falls associated with it. Her pain is rated 10 out of 10 and radiating into her knee. Her pain is not associated with any constitutional symptoms. She is not reporting any fevers or chills or sweats. Is not reporting any lymphadenopathy or constitutional symptoms. She has gained weight since her last visit. She does continue to have a reasonable performance status without any hindrance or decline. She is not reporting any bulky adenopathy or change in her bowel habits. No urinary symptoms. Rest of her review of systems unremarkable.  Medications: I have reviewed the patient's current medications.  Current Outpatient Prescriptions  Medication Sig Dispense Refill  . carvedilol (COREG) 6.25 MG tablet Take 6.25 mg by mouth 2 (two) times daily with a meal.      . levothyroxine (SYNTHROID, LEVOTHROID) 25 MCG tablet Take 25 mcg by mouth daily before breakfast.        . lisinopril (PRINIVIL,ZESTRIL) 20 MG tablet Take 20 mg by mouth daily.      . ondansetron (ZOFRAN ODT) 4 MG disintegrating tablet 4mg  ODT q4 hours prn nausea/vomit  8 tablet  0  . OVER THE COUNTER MEDICATION Take 1 capsule by mouth daily as needed (Urinary pain). Urical for      . sulfamethoxazole-trimethoprim (BACTRIM DS) 800-160 MG per tablet Take 1 tablet by mouth 2 (two) times daily. Started on 05-19-13 for 7 days.      Marland Kitchen oxyCODONE-acetaminophen (PERCOCET)  5-325 MG per tablet Take 2 tablets by mouth every 4 (four) hours as needed for pain.  20 tablet  0   No current facility-administered medications for this visit.     Allergies: No Known Allergies  Past Medical History, Surgical history, Social history, and Family History were reviewed and updated.  Review of Systems:  Remaining ROS negative. Physical Exam: Blood pressure 135/57, pulse 52, temperature 98.6 F (37 C), temperature source Oral, resp. rate 19, height 5\' 2"  (1.575 m), weight 192 lb 14.4 oz (87.499 kg), SpO2 100.00%. ECOG: 0 General appearance: alert awake and appeared in severe distress. Head: Normocephalic, without obvious abnormality Neck: no adenopathy Lymph nodes: Cervical, supraclavicular, and axillary nodes normal. Heart:regular rate and rhythm, S1, S2 normal, no murmur, click, rub or gallop Lung:chest clear, no wheezing, rales, normal symmetric air entry Abdomin: soft, non-tender, without masses or organomegaly EXT/ hip exam:no erythema, induration, or nodules. She was not able to put weight on her right leg. She had very limited range of motion related to pain. Could not appreciate any skin rashes or lesions.   Lab Results: Lab Results  Component Value Date   WBC 95.5* 07/29/2014   HGB 11.5* 07/29/2014   HCT 38.1 07/29/2014   MCV 88.1 07/29/2014   PLT 179 07/29/2014     Chemistry      Component Value Date/Time   NA 140 07/29/2014 1521   NA 134* 05/25/2013 1221   K 3.7 07/29/2014 1521   K 4.5 05/25/2013 1221   CL 100  05/25/2013 1221   CL 107 12/24/2012 1332   CO2 28 07/29/2014 1521   CO2 26 05/25/2013 1221   BUN 20.9 07/29/2014 1521   BUN 24* 05/25/2013 1221   CREATININE 1.2* 07/29/2014 1521   CREATININE 1.20* 05/25/2013 1221   CREATININE 1.05 02/24/2011 1633      Component Value Date/Time   CALCIUM 9.3 07/29/2014 1521   CALCIUM 9.6 05/25/2013 1221   ALKPHOS 108 07/29/2014 1521   ALKPHOS 104 05/25/2013 1221   AST 17 07/29/2014 1521   AST 21 05/25/2013 1221   ALT  9 07/29/2014 1521   ALT 18 05/25/2013 1221   BILITOT 0.35 07/29/2014 1521   BILITOT 0.2* 05/25/2013 1221       Impression and Plan:  53 year old woman with the following issues:  1. Stage 0 CLL presented with a lymphocytosis. Her white cell count slightly elevated from previous testing but have not dramatically changed in the last year. She is  asymptomatic and I see no reason for any intervention or indication for treatments. I do not think her hip pain is related to CLL.   2. Right-sided hip pain: Unclear etiology but less likely related to his CLL. Given the severity and acuity of her symptoms I think she needs to be evaluated rather urgently. I am not quite sure if she still has a primary care physician she is not sure either. She will likely need imaging studies and possibly evaluation by orthopedic surgery. To expedite her care I will send her to the emergency department today to triage her hip pain appropriately.  3. Followup: Will be in 6 months.  Zola Button, MD 9/30/20154:12 PM

## 2014-07-29 NOTE — ED Notes (Signed)
Pt sent from Cancer center for right hip pain that radiates to her right knee that started a couple days ago and worse with movement.  Pt has been trying to handle it herself by taking tylenol but gotten worse.

## 2014-07-29 NOTE — ED Notes (Signed)
Utilized interpreter phone to communicate with patient and her husband. Acknowledged understanding she is not to drink alcohol, drive, operate heavy machinery with prescribed medications and is not to take extra tylenol. Patient is ambulatory with steady gait. No other questions/concerns. Work note given. Crackers and ginger ale given.

## 2014-07-29 NOTE — Discharge Instructions (Signed)
Citica  (Sciatica)  La citica es Conservation officer, historic buildings, debilidad, entumecimiento u hormigueo a lo largo del nervio citico. El nervio comienza en la zona inferior de la espalda y desciende por la parte posterior de cada pierna. El nervio controla los msculos de la parte inferior de la pierna y de la zona posterior de la rodilla, y transmite la sensibilidad a la parte posterior del muslo, la pierna y la planta del pie. La citica es un sntoma de otras afecciones mdicas. Por ejemplo, un dao a los nervios o algunas enfermedades como un disco herniado o un espoln seo en la columna vertebral, podran daarle o presionar en el nervio citico. Esto causa dolor, debilidad y otras sensaciones normalmente asociadas con la citica. Generalmente la citica afecta slo un lado del cuerpo. CAUSAS   Disco herniado o desplazado.  Enfermedad degenerativa del disco.  Un sndrome doloroso que compromete un msculo angosto de los glteos (sndrome piriforme).  Lesin o fractura plvica.  Embarazo.  Tumor (casos raros). SNTOMAS  Los sntomas pueden variar de leves a muy graves. Por lo general, los sntomas descienden desde la zona lumbar a las nalgas y la parte posterior de la pierna. Ellos son:   Hormigueo leve o dolor sordo en la parte inferior de la espalda, la pierna o la cadera.  Adormecimiento en la parte posterior de la pantorrilla o la planta del pie.  Sensacin de [employer] zona lumbar, la pierna o la cadera.  Dolor agudo en la zona inferior de la espalda, la pierna o la cadera.  Debilidad en las piernas.  Dolor de espalda intenso que [employer] movimientos. Los sntomas pueden empeorar al toser, Brewing technologist, rer o estar sentado o parado durante [employer]. Adems, el sobrepeso puede empeorar los sntomas.  DIAGNSTICO  Su mdico le har un examen fsico para buscar los sntomas comunes de la citica. Le pedir que haga algunos movimientos o actividades que activaran el dolor del nervio  citico. Para encontrar las causas de la citica podr indicarle otros estudios. Estos pueden ser:   Anlisis de Ignacio.  Radiografas.  Pruebas de diagnstico por imgenes, como resonancia magntica o tomografa computada. TRATAMIENTO  El tratamiento se dirige a las causas de la citica. A veces, el tratamiento no es necesario, y Conservation officer, historic buildings y Health and safety inspector desaparecen por s mismos. Si necesita tratamiento, su mdico puede sugerir:   Medicamentos de venta libre para Best boy.  Medicamentos recetados, como antiinflamatorios, relajantes musculares o narcticos.  Aplicacin de calor o hielo en la zona del dolor.  Inyecciones de corticoides para disminuir el dolor, la irritacin y la inflamacin alrededor del nervio.  Reduccin de la [employer] perodos de [place].  Ejercicios y estiramiento del abdomen para fortalecer y Teacher, English as a foreign language la flexibilidad de la columna vertebral. Su mdico puede sugerirle perder peso si el peso extra empeora el dolor de espalda.  Fisioterapia.  La ciruga para eliminar lo que presiona o pincha el nervio, como un espoln seo o parte de una hernia de disco. INSTRUCCIONES PARA EL CUIDADO EN EL HOGAR   Slo tome medicamentos de venta libre o recetados para Glass blower/designer o Health and safety inspector, segn las indicaciones de su mdico.  Aplique hielo sobre el rea dolorida durante 20 minutos 3-4 veces por da durante los primeras 48-72 horas. Luego intente aplicar calor de la misma manera.  Haga ejercicios, elongue o realice sus actividades habituales, si no le causan ms dolor.  Cumpla con todas las sesiones de fisioterapia, segn le  indique su mdico.  Cumpla con todas las visitas de control, segn le indique su mdico.  No use tacones altos o zapatos que no tengan buen apoyo.  Verifique que el colchn no sea muy blando. Un colchn firme aliviar el dolor y las molestias. SOLICITE ATENCIN MDICA DE INMEDIATO SI:   Pierde el control de la vejiga o del intestino  (incontinencia).  Aumenta la debilidad en la zona inferior de la espalda, la pelvis, las nalgas o las piernas.  Siente irritacin o inflamacin en la espalda.  Tiene sensacin de ardor al orinar.  El dolor empeora cuando se acuesta o lo despierta por la noche.  El dolor es peor del que experiment en el pasado.  Dura ms de 4 semanas.  Pierde peso sin motivo de manera sbita. ASEGRESE DE QUE:   Comprende estas instrucciones.  Controlar su enfermedad.  Solicitar ayuda de inmediato si no mejora o si empeora. Document Released: 10/16/2005 Document Revised: 04/16/2012 ExitCare Patient Information 2015 [employer]. This information is not intended to replace advice given to you by your health care provider. Make sure you discuss any questions you have with your health care provider.  

## 2014-07-30 ENCOUNTER — Telehealth: Payer: Self-pay | Admitting: Oncology

## 2014-07-30 NOTE — Telephone Encounter (Signed)
s.w. interpreter Almyra Free to give pt appts....Marland Kitchenok and aware

## 2014-08-31 ENCOUNTER — Encounter: Payer: Self-pay | Admitting: Oncology

## 2015-02-02 ENCOUNTER — Telehealth: Payer: Self-pay | Admitting: Oncology

## 2015-02-02 ENCOUNTER — Ambulatory Visit: Payer: Self-pay | Admitting: Oncology

## 2015-02-02 ENCOUNTER — Other Ambulatory Visit: Payer: Self-pay

## 2015-02-02 NOTE — Telephone Encounter (Signed)
JULIE PT INTERPRETER STOPPED BY PER PATIENT TO R/S APPOINTMENTS. R/S 4/5 APPOINTMENT TO 5/13 PER PT VIA JULIE THE INTERPRETER - JULY GAVE PT NEW APPOINTMENT D/T.

## 2015-03-12 ENCOUNTER — Other Ambulatory Visit: Payer: Self-pay

## 2015-03-12 ENCOUNTER — Ambulatory Visit: Payer: Self-pay | Admitting: Oncology

## 2015-03-26 ENCOUNTER — Ambulatory Visit (HOSPITAL_BASED_OUTPATIENT_CLINIC_OR_DEPARTMENT_OTHER): Payer: Self-pay | Admitting: Oncology

## 2015-03-26 ENCOUNTER — Other Ambulatory Visit (HOSPITAL_BASED_OUTPATIENT_CLINIC_OR_DEPARTMENT_OTHER): Payer: Self-pay

## 2015-03-26 ENCOUNTER — Telehealth: Payer: Self-pay | Admitting: Oncology

## 2015-03-26 VITALS — BP 128/66 | HR 83 | Temp 97.7°F | Resp 18 | Ht 62.0 in | Wt 194.5 lb

## 2015-03-26 DIAGNOSIS — C9110 Chronic lymphocytic leukemia of B-cell type not having achieved remission: Secondary | ICD-10-CM

## 2015-03-26 DIAGNOSIS — M25551 Pain in right hip: Secondary | ICD-10-CM

## 2015-03-26 LAB — COMPREHENSIVE METABOLIC PANEL (CC13)
ALK PHOS: 123 U/L (ref 40–150)
ALT: 19 U/L (ref 0–55)
ANION GAP: 11 meq/L (ref 3–11)
AST: 24 U/L (ref 5–34)
Albumin: 3.6 g/dL (ref 3.5–5.0)
BILIRUBIN TOTAL: 0.31 mg/dL (ref 0.20–1.20)
BUN: 14.8 mg/dL (ref 7.0–26.0)
CALCIUM: 8.5 mg/dL (ref 8.4–10.4)
CO2: 24 meq/L (ref 22–29)
CREATININE: 1.1 mg/dL (ref 0.6–1.1)
Chloride: 107 mEq/L (ref 98–109)
EGFR: 55 mL/min/{1.73_m2} — AB (ref >90)
Glucose: 107 mg/dl (ref 70–140)
Potassium: 3.8 mEq/L (ref 3.5–5.1)
Sodium: 143 mEq/L (ref 136–145)
TOTAL PROTEIN: 7 g/dL (ref 6.4–8.3)

## 2015-03-26 LAB — CBC WITH DIFFERENTIAL/PLATELET
BASO%: 0.3 % (ref 0.0–2.0)
BASOS ABS: 0.3 10*3/uL — AB (ref 0.0–0.1)
EOS%: 0.2 % (ref 0.0–7.0)
Eosinophils Absolute: 0.3 10*3/uL (ref 0.0–0.5)
HEMATOCRIT: 34.9 % (ref 34.8–46.6)
HGB: 11.3 g/dL — ABNORMAL LOW (ref 11.6–15.9)
LYMPH#: 102.7 10*3/uL — AB (ref 0.9–3.3)
LYMPH%: 92 % — ABNORMAL HIGH (ref 14.0–49.7)
MCH: 27.8 pg (ref 25.1–34.0)
MCHC: 32.4 g/dL (ref 31.5–36.0)
MCV: 86 fL (ref 79.5–101.0)
MONO#: 2.2 10*3/uL — ABNORMAL HIGH (ref 0.1–0.9)
MONO%: 2 % (ref 0.0–14.0)
NEUT#: 6.7 10*3/uL — ABNORMAL HIGH (ref 1.5–6.5)
NEUT%: 6 % — AB (ref 38.4–76.8)
Platelets: 167 10*3/uL (ref 145–400)
RBC: 4.06 10*6/uL (ref 3.70–5.45)
RDW: 14.6 % — ABNORMAL HIGH (ref 11.2–14.5)
WBC: 111.7 10*3/uL (ref 3.9–10.3)

## 2015-03-26 LAB — TECHNOLOGIST REVIEW

## 2015-03-26 NOTE — Telephone Encounter (Signed)
Pt confirmed labs/ov per 05/27 POF, gave pt AVS and Calendar.... KJ °

## 2015-03-26 NOTE — Progress Notes (Signed)
Hematology and Oncology Follow Up Visit  Katherine Terrell [id number] 1961/06/29 54 y.o. 03/26/2015 2:05 PM No PCP Per PatientNo ref. provider found   Principle Diagnosis: 54 year old woman with stage 0 CLL diagnosed in 2011. Presented with lymphocytosis and mild splenomegaly.  Current therapy: Observation and surveillance.  Interim History:  Patient presents today for a followup visit accompanied by an interpreter today. Since her last visit, she reported a gastric iron in her apartment which cause her to be very upset and stressed in the last few months. She does not report any physical injury but feels very emotional and stressed and fear that another episode might happen again. She reports her oral intake have declined somewhat loss 5 pounds. Her weight is actually up 3 pounds from 07/29/2014 but have reported normally her weight is close to 200 pounds. She is not reporting fevers but no  chills or sweats. Is not reporting any lymphadenopathy or constitutional symptoms. She does continue to have a reasonable performance status without any hindrance or decline. She is not reporting any bulky adenopathy or change in her bowel habits. No urinary symptoms. Rest of her review of systems unremarkable.  Medications: I have reviewed the patient's current medications.  Current Outpatient Prescriptions  Medication Sig Dispense Refill  . acetaminophen (TYLENOL) 325 MG tablet Take 650 mg by mouth every 6 (six) hours as needed.    . carvedilol (COREG) 6.25 MG tablet Take 6.25 mg by mouth 2 (two) times daily with a meal.    . levothyroxine (SYNTHROID, LEVOTHROID) 25 MCG tablet Take 25 mcg by mouth daily before breakfast.      . lisinopril (PRINIVIL,ZESTRIL) 20 MG tablet Take 20 mg by mouth daily.     No current facility-administered medications for this visit.     Allergies:  Allergies  Allergen Reactions  . Tramadol Nausea And Vomiting    Past Medical History, Surgical history, Social history, and  Family History were reviewed and updated.  Review of Systems:  Remaining ROS negative. Physical Exam: Blood pressure 128/66, pulse 83, temperature 97.7 F (36.5 C), temperature source Oral, resp. rate 18, height 5\' 2"  (1.575 m), weight 194 lb 8 oz (88.225 kg), SpO2 100 %. ECOG: 0 General appearance: alert awake and appeared in severe distress. Head: Normocephalic, without obvious abnormality Neck: no adenopathy Lymph nodes: Cervical, supraclavicular, and axillary nodes normal. Heart:regular rate and rhythm, S1, S2 normal, no murmur, click, rub or gallop Lung:chest clear, no wheezing, rales, normal symmetric air entry Abdomin: soft, non-tender, without masses or organomegaly Extremities: No edema noted.    Lab Results: Lab Results  Component Value Date   WBC 95.5* 07/29/2014   HGB 11.5* 07/29/2014   HCT 38.1 07/29/2014   MCV 88.1 07/29/2014   PLT 179 07/29/2014     Chemistry      Component Value Date/Time   NA 140 07/29/2014 1521   NA 134* 05/25/2013 1221   K 3.7 07/29/2014 1521   K 4.5 05/25/2013 1221   CL 100 05/25/2013 1221   CL 107 12/24/2012 1332   CO2 28 07/29/2014 1521   CO2 26 05/25/2013 1221   BUN 20.9 07/29/2014 1521   BUN 24* 05/25/2013 1221   CREATININE 1.2* 07/29/2014 1521   CREATININE 1.20* 05/25/2013 1221   CREATININE 1.05 02/24/2011 1633      Component Value Date/Time   CALCIUM 9.3 07/29/2014 1521   CALCIUM 9.6 05/25/2013 1221   ALKPHOS 108 07/29/2014 1521   ALKPHOS 104 05/25/2013 1221   AST 17 07/29/2014  1521   AST 21 05/25/2013 1221   ALT 9 07/29/2014 1521   ALT 18 05/25/2013 1221   BILITOT 0.35 07/29/2014 1521   BILITOT 0.2* 05/25/2013 1221       Impression and Plan:  54 year old woman with the following issues:  1. Stage 0 CLL presented with a lymphocytosis. Her white cell count continues to rise slowly with a doubling time of close to 3 years. It is unclear if she is symptomatic from her disease but certainly will need to restage her  at this time given her symptoms of subjective weight loss and fevers. I'll obtain CT scan images and repeat laboratory testing and consider therapy if she has bulky adenopathy or clear-cut symptoms.   2. Right-sided hip pain: symptoms have improved at this time.   3. Followup: Will be in 6 months.  Zola Button, MD 5/27/20162:05 PM

## 2015-05-26 ENCOUNTER — Telehealth (HOSPITAL_COMMUNITY): Payer: Self-pay | Admitting: Radiology

## 2015-05-26 ENCOUNTER — Other Ambulatory Visit: Payer: Self-pay

## 2015-05-27 ENCOUNTER — Telehealth: Payer: Self-pay | Admitting: Oncology

## 2015-05-27 NOTE — Telephone Encounter (Signed)
Rescheduled lab/fu per patient - Katherine Terrell (interpreter). Left message for Katherine Terrell re new date/time for lab 8/5 @ 12pm and FS 8/9 @ 12pm. April Pait in central radiology working on rescheduling scan and will contact Katherine Terrell.

## 2015-05-28 ENCOUNTER — Ambulatory Visit: Payer: Self-pay | Admitting: Oncology

## 2015-06-04 ENCOUNTER — Ambulatory Visit (HOSPITAL_COMMUNITY)
Admission: RE | Admit: 2015-06-04 | Discharge: 2015-06-04 | Disposition: A | Discharge status: Home / Self Care | Payer: Self-pay | Source: Ambulatory Visit | Attending: Oncology | Admitting: Oncology

## 2015-06-04 ENCOUNTER — Other Ambulatory Visit (HOSPITAL_BASED_OUTPATIENT_CLINIC_OR_DEPARTMENT_OTHER): Payer: Self-pay

## 2015-06-04 ENCOUNTER — Ambulatory Visit (HOSPITAL_COMMUNITY): Payer: Self-pay

## 2015-06-04 ENCOUNTER — Telehealth: Payer: Self-pay | Admitting: Oncology

## 2015-06-04 ENCOUNTER — Encounter (HOSPITAL_COMMUNITY): Payer: Self-pay

## 2015-06-04 DIAGNOSIS — R161 Splenomegaly, not elsewhere classified: Secondary | ICD-10-CM | POA: Insufficient documentation

## 2015-06-04 DIAGNOSIS — J811 Chronic pulmonary edema: Secondary | ICD-10-CM | POA: Insufficient documentation

## 2015-06-04 DIAGNOSIS — C9110 Chronic lymphocytic leukemia of B-cell type not having achieved remission: Secondary | ICD-10-CM

## 2015-06-04 DIAGNOSIS — R918 Other nonspecific abnormal finding of lung field: Secondary | ICD-10-CM | POA: Insufficient documentation

## 2015-06-04 DIAGNOSIS — R590 Localized enlarged lymph nodes: Secondary | ICD-10-CM | POA: Insufficient documentation

## 2015-06-04 LAB — CBC WITH DIFFERENTIAL/PLATELET
BASO%: 0.5 % (ref 0.0–2.0)
BASOS ABS: 0.5 10*3/uL — AB (ref 0.0–0.1)
EOS%: 0.4 % (ref 0.0–7.0)
Eosinophils Absolute: 0.4 10*3/uL (ref 0.0–0.5)
HEMATOCRIT: 37.1 % (ref 34.8–46.6)
HGB: 11.3 g/dL — ABNORMAL LOW (ref 11.6–15.9)
LYMPH#: 104.9 10*3/uL — AB (ref 0.9–3.3)
LYMPH%: 93.8 % — AB (ref 14.0–49.7)
MCH: 26.1 pg (ref 25.1–34.0)
MCHC: 30.3 g/dL — AB (ref 31.5–36.0)
MCV: 86.1 fL (ref 79.5–101.0)
MONO#: 1.2 10*3/uL — AB (ref 0.1–0.9)
MONO%: 1 % (ref 0.0–14.0)
NEUT%: 4.3 % — AB (ref 38.4–76.8)
NEUTROS ABS: 4.8 10*3/uL (ref 1.5–6.5)
Platelets: 161 10*3/uL (ref 145–400)
RBC: 4.31 10*6/uL (ref 3.70–5.45)
RDW: 14.7 % — ABNORMAL HIGH (ref 11.2–14.5)
WBC: 111.9 10*3/uL — AB (ref 3.9–10.3)

## 2015-06-04 LAB — COMPREHENSIVE METABOLIC PANEL (CC13)
ALT: 19 U/L (ref 0–55)
AST: 21 U/L (ref 5–34)
Albumin: 3.8 g/dL (ref 3.5–5.0)
Alkaline Phosphatase: 120 U/L (ref 40–150)
Anion Gap: 8 mEq/L (ref 3–11)
BUN: 12.7 mg/dL (ref 7.0–26.0)
CALCIUM: 9.2 mg/dL (ref 8.4–10.4)
CHLORIDE: 105 meq/L (ref 98–109)
CO2: 29 mEq/L (ref 22–29)
Creatinine: 1.2 mg/dL — ABNORMAL HIGH (ref 0.6–1.1)
EGFR: 52 mL/min/{1.73_m2} — AB (ref >90)
Glucose: 107 mg/dl (ref 70–140)
POTASSIUM: 4 meq/L (ref 3.5–5.1)
SODIUM: 143 meq/L (ref 136–145)
Total Bilirubin: 0.31 mg/dL (ref 0.20–1.20)
Total Protein: 7.2 g/dL (ref 6.4–8.3)

## 2015-06-04 LAB — TECHNOLOGIST REVIEW

## 2015-06-04 MED ORDER — IOHEXOL 300 MG/ML  SOLN
100.0000 mL | Freq: Once | INTRAMUSCULAR | Status: AC | PRN
  Administered 2015-06-04: 100 mL via INTRAVENOUS

## 2015-06-04 NOTE — Telephone Encounter (Signed)
returned call and lvm to call back to r/s  °

## 2015-06-08 ENCOUNTER — Telehealth: Payer: Self-pay | Admitting: Oncology

## 2015-06-08 ENCOUNTER — Encounter: Payer: Self-pay | Admitting: *Deleted

## 2015-06-08 ENCOUNTER — Ambulatory Visit (HOSPITAL_BASED_OUTPATIENT_CLINIC_OR_DEPARTMENT_OTHER): Payer: Self-pay | Admitting: Oncology

## 2015-06-08 VITALS — BP 136/91 | HR 74 | Temp 98.4°F | Resp 18 | Ht 62.0 in | Wt 193.1 lb

## 2015-06-08 DIAGNOSIS — C9110 Chronic lymphocytic leukemia of B-cell type not having achieved remission: Secondary | ICD-10-CM

## 2015-06-08 DIAGNOSIS — R591 Generalized enlarged lymph nodes: Secondary | ICD-10-CM

## 2015-06-08 DIAGNOSIS — R161 Splenomegaly, not elsewhere classified: Secondary | ICD-10-CM

## 2015-06-08 DIAGNOSIS — M25551 Pain in right hip: Secondary | ICD-10-CM

## 2015-06-08 NOTE — Progress Notes (Signed)
Hematology and Oncology Follow Up Visit  Katherine Terrell [id number] May 15, 1961 54 y.o. 06/08/2015 12:47 PM No PCP Per PatientNo ref. provider found   Principle Diagnosis: 54 year old woman with stage 0 CLL diagnosed in 2011. Presented with lymphocytosis and mild splenomegaly.  Current therapy: Observation and surveillance.  Interim History: [name] presents today for a followup visit accompanied by an interpreter today. Since her last visit, she is doing very well compared to last visit. She reports that the stress in her life is much improved and has no physical complaints. She does report occasional lower extremity swelling which has improved recently. Her appetite had been reasonable and maintained her weight. She is not reporting fevers but no  chills or sweats. Is not reporting any lymphadenopathy or constitutional symptoms. She does continue to have a reasonable performance status without any hindrance or decline. She is not reporting any bulky adenopathy or change in her bowel habits. No urinary symptoms. She does not report any headaches, blurry vision, syncope or seizures. She does not report any chest pain, palpitation orthopnea. She does not report any cough or hemoptysis. He does not report any nausea, vomiting oral satiety. She does not report any constipation or diarrhea. She does not report any lymphadenopathy or petechiae. Rest of her review of systems unremarkable.  Medications: I have reviewed the patient's current medications.  Current Outpatient Prescriptions  Medication Sig Dispense Refill  . acetaminophen (TYLENOL) 325 MG tablet Take 650 mg by mouth every 6 (six) hours as needed.    . carvedilol (COREG) 6.25 MG tablet Take 6.25 mg by mouth 2 (two) times daily with a meal.    . levothyroxine (SYNTHROID, LEVOTHROID) 25 MCG tablet Take 25 mcg by mouth daily before breakfast.      . lisinopril (PRINIVIL,ZESTRIL) 20 MG tablet Take 20 mg by mouth daily.     No current  facility-administered medications for this visit.     Allergies:  Allergies  Allergen Reactions  . Tramadol Nausea And Vomiting    Past Medical History, Surgical history, Social history, and Family History were reviewed and updated.   Physical Exam: Blood pressure 136/91, pulse 74, temperature 98.4 F (36.9 C), temperature source Oral, resp. rate 18, height 5\' 2"  (1.575 m), weight 193 lb 1.6 oz (87.59 kg), SpO2 99 %. ECOG: 0 General appearance: alert awake and appeared in no distress. Head: Normocephalic, without obvious abnormality Neck: no adenopathy Lymph nodes: Cervical, supraclavicular, and axillary nodes normal. Heart:regular rate and rhythm, S1, S2 normal, no murmur, click, rub or gallop Lung:chest clear, no wheezing, rales, normal symmetric air entry Abdomin: soft, non-tender, without masses or organomegaly Extremities: No edema noted.    Lab Results: Lab Results  Component Value Date   WBC 111.9* 06/04/2015   HGB 11.3* 06/04/2015   HCT 37.1 06/04/2015   MCV 86.1 06/04/2015   PLT 161 06/04/2015     Chemistry      Component Value Date/Time   NA 143 06/04/2015 1252   NA 134* 05/25/2013 1221   K 4.0 06/04/2015 1252   K 4.5 05/25/2013 1221   CL 100 05/25/2013 1221   CL 107 12/24/2012 1332   CO2 29 06/04/2015 1252   CO2 26 05/25/2013 1221   BUN 12.7 06/04/2015 1252   BUN 24* 05/25/2013 1221   CREATININE 1.2* 06/04/2015 1252   CREATININE 1.20* 05/25/2013 1221   CREATININE 1.05 02/24/2011 1633      Component Value Date/Time   CALCIUM 9.2 06/04/2015 1252   CALCIUM 9.6 05/25/2013  1221   ALKPHOS 120 06/04/2015 1252   ALKPHOS 104 05/25/2013 1221   AST 21 06/04/2015 1252   AST 21 05/25/2013 1221   ALT 19 06/04/2015 1252   ALT 18 05/25/2013 1221   BILITOT 0.31 06/04/2015 1252   BILITOT 0.2* 05/25/2013 1221       EXAM: CT CHEST, ABDOMEN, AND PELVIS WITH CONTRAST  TECHNIQUE: Multidetector CT imaging of the chest, abdomen and pelvis was performed  following the standard protocol during bolus administration of intravenous contrast.  CONTRAST: 123mL OMNIPAQUE IOHEXOL 300 MG/ML SOLN  COMPARISON: CT 05/25/2013  FINDINGS: CT CHEST FINDINGS  Mediastinum/Nodes: . No axillary or supraclavicular adenopathy. Small 6 mm LEFT supraclavicular node. Small paratracheal lymph nodes are numerous but less than 10 mm each. Mildly enlarged enlarged RIGHT hilar lymph node measures 13 mm. No central pulmonary embolism. No pericardial fluid.  Lungs/Pleura: Mild ground-glass opacities throughout upper lobes suggesting atelectasis or mild edema. No discrete pulmonary nodules.  CT ABDOMEN AND PELVIS FINDINGS  Hepatobiliary: No focal hepatic lesion. No biliary duct dilatation. Gallbladder is normal. Common bile duct is normal.  Pancreas: Pancreas is normal. No ductal dilatation. No pancreatic inflammation.  Spleen: Spleen is mildly enlarged measuring 14.9 cm in craniocaudad dimension and with a calculated volume of 580 cubic cm.  Adrenals/urinary tract: Small 8 mm nodule of the lateral limb of the LEFT adrenal gland is unchanged. Kidneys are normal. Ureters and bladder normal.  Stomach/Bowel: Stomach, small bowel, appendix, and cecum are normal. The colon and rectosigmoid colon are normal.  Vascular/Lymphatic: Abdominal aorta is normal caliber. Small retrocrural lymph nodes measure 11 mm short axis. No pathologically enlarged retroperitoneal lymph nodes. Bilateral mildly enlarged external iliac lymph nodes measure 13 mm short axis on image 105, series 2.  Reproductive: Normal uterus and ovaries  Musculoskeletal: No aggressive osseous lesion.  Other: No free fluid.  IMPRESSION: Chest Impression:  1. Multiple small mediastinal lymph nodes and 1 enlarged hilar lymph node. 2. Mild pulmonary edema pattern in the upper lobes.  Abdomen / Pelvis Impression:  * *Mild splenomegaly. *Mildly enlarged external iliac  lymph nodes. *   Electronically Signed  By: Suzy Bouchard M.D.  On: 06/04/2015 14:45      Result History     CT Abdomen Pelvis W Contrast (Order #[id number]) on 06/04/2015 - Order Result History Report          Vitals     Height Weight BMI (Calculated)    5\' 2"  (1.575 m) 193 lb 1.6 oz (87.59 kg) 35.4      Interpretation Summary     CLINICAL DATA: Chronic lymphocytic leukemia diagnosed 2011. Subsequent treatment evaluation  EXAM: CT CHEST, ABDOMEN, AND PELVIS WITH CONTRAST  TECHNIQUE: Multidetector CT imaging of the chest, abdomen and pelvis was performed following the standard protocol during bolus administration of intravenous contrast.  CONTRAST: 131mL OMNIPAQUE IOHEXOL 300 MG/ML SOLN  COMPARISON: CT 05/25/2013  FINDINGS: CT CHEST FINDINGS  Mediastinum/Nodes: . No axillary or supraclavicular adenopathy. Small 6 mm LEFT supraclavicular node. Small paratracheal lymph nodes are numerous but less than 10 mm each. Mildly enlarged enlarged RIGHT hilar lymph node measures 13 mm. No central pulmonary embolism. No pericardial fluid.  Lungs/Pleura: Mild ground-glass opacities throughout upper lobes suggesting atelectasis or mild edema. No discrete pulmonary nodules.  CT ABDOMEN AND PELVIS FINDINGS  Hepatobiliary: No focal hepatic lesion. No biliary duct dilatation. Gallbladder is normal. Common bile duct is normal.  Pancreas: Pancreas is normal. No ductal dilatation. No pancreatic inflammation.  Spleen: Spleen is  mildly enlarged measuring 14.9 cm in craniocaudad dimension and with a calculated volume of 580 cubic cm.  Adrenals/urinary tract: Small 8 mm nodule of the lateral limb of the LEFT adrenal gland is unchanged. Kidneys are normal. Ureters and bladder normal.  Stomach/Bowel: Stomach, small bowel, appendix, and cecum are normal. The colon and rectosigmoid colon are normal.  Vascular/Lymphatic: Abdominal aorta is  normal caliber. Small retrocrural lymph nodes measure 11 mm short axis. No pathologically enlarged retroperitoneal lymph nodes. Bilateral mildly enlarged external iliac lymph nodes measure 13 mm short axis on image 105, series 2.  Reproductive: Normal uterus and ovaries  Musculoskeletal: No aggressive osseous lesion.  Other: No free fluid.  IMPRESSION: Chest Impression:  1. Multiple small mediastinal lymph nodes and 1 enlarged hilar lymph node. 2. Mild pulmonary edema pattern in the upper lobes.  Abdomen / Pelvis Impression:  * *Mild splenomegaly. *Mildly enlarged external iliac lymph nodes. *      Impression and Plan:  54 year old woman with the following issues:  1. Stage 0 CLL presented with a lymphocytosis. Her white cell count continues to rise slowly with a doubling time of close to 2 years. CT scan of the chest abdomen and pelvis done on 06/04/2015 was reviewed today and showed no major changes. She has borderline lymphadenopathy and mild splenomegaly. She is clinically asymptomatic at this time I do not see any indication for treatment. The natural course of this disease was reviewed again with the patient via an interpreter. She understands that although she is asymptomatic and does not require treatment at this time, this might change in the future. Indication of treatments were reviewed today that include progressive symptomatic lymphadenopathy, rapid doubling time, cytopenias, constitutional symptoms among others. Opportunistic infections as well as immune dysregulation also indication for treatment.  For the time being we'll continue observation and repeat clinical exam and laboratory testing in 4 months.  2. Right-sided hip pain: symptoms have improved at this time.   3. Followup: Will be in 4 months.  Zola Button, MD 8/9/201612:47 PM

## 2015-06-08 NOTE — Telephone Encounter (Signed)
pt req to come same day due to job situation/sch accordingly-adv Dr Alen Blew wanted pt to come 12/6-pt understood

## 2015-06-08 NOTE — Telephone Encounter (Signed)
per pof to sch pt appt-gave pt copy of avs °

## 2015-07-22 ENCOUNTER — Other Ambulatory Visit: Payer: Self-pay | Admitting: Obstetrics and Gynecology

## 2015-07-22 DIAGNOSIS — Z1231 Encounter for screening mammogram for malignant neoplasm of breast: Secondary | ICD-10-CM

## 2015-08-12 ENCOUNTER — Ambulatory Visit (HOSPITAL_COMMUNITY): Payer: Self-pay

## 2015-10-07 ENCOUNTER — Telehealth: Payer: Self-pay | Admitting: Oncology

## 2015-10-07 ENCOUNTER — Ambulatory Visit (HOSPITAL_BASED_OUTPATIENT_CLINIC_OR_DEPARTMENT_OTHER): Payer: Self-pay | Admitting: Oncology

## 2015-10-07 ENCOUNTER — Other Ambulatory Visit (HOSPITAL_BASED_OUTPATIENT_CLINIC_OR_DEPARTMENT_OTHER): Payer: Self-pay

## 2015-10-07 VITALS — BP 136/71 | HR 84 | Temp 98.0°F | Ht 62.0 in | Wt 183.9 lb

## 2015-10-07 DIAGNOSIS — R599 Enlarged lymph nodes, unspecified: Secondary | ICD-10-CM

## 2015-10-07 DIAGNOSIS — C9110 Chronic lymphocytic leukemia of B-cell type not having achieved remission: Secondary | ICD-10-CM

## 2015-10-07 DIAGNOSIS — R161 Splenomegaly, not elsewhere classified: Secondary | ICD-10-CM

## 2015-10-07 DIAGNOSIS — J40 Bronchitis, not specified as acute or chronic: Secondary | ICD-10-CM

## 2015-10-07 LAB — COMPREHENSIVE METABOLIC PANEL
ALBUMIN: 3.7 g/dL (ref 3.5–5.0)
ALK PHOS: 119 U/L (ref 40–150)
ALT: 11 U/L (ref 0–55)
ANION GAP: 14 meq/L — AB (ref 3–11)
AST: 18 U/L (ref 5–34)
BUN: 16.3 mg/dL (ref 7.0–26.0)
CALCIUM: 9.4 mg/dL (ref 8.4–10.4)
CO2: 20 mEq/L — ABNORMAL LOW (ref 22–29)
CREATININE: 1 mg/dL (ref 0.6–1.1)
Chloride: 109 mEq/L (ref 98–109)
EGFR: 61 mL/min/{1.73_m2} — ABNORMAL LOW (ref >90)
Glucose: 104 mg/dl (ref 70–140)
POTASSIUM: 3.7 meq/L (ref 3.5–5.1)
Sodium: 143 mEq/L (ref 136–145)
Total Bilirubin: 0.66 mg/dL (ref 0.20–1.20)
Total Protein: 7 g/dL (ref 6.4–8.3)

## 2015-10-07 LAB — CBC WITH DIFFERENTIAL/PLATELET
BASO%: 0.2 % (ref 0.0–2.0)
BASOS ABS: 0.2 10*3/uL — AB (ref 0.0–0.1)
EOS%: 0.4 % (ref 0.0–7.0)
Eosinophils Absolute: 0.4 10*3/uL (ref 0.0–0.5)
HEMATOCRIT: 33.5 % — AB (ref 34.8–46.6)
HEMOGLOBIN: 10.1 g/dL — AB (ref 11.6–15.9)
LYMPH#: 93.4 10*3/uL — AB (ref 0.9–3.3)
LYMPH%: 92.6 % — ABNORMAL HIGH (ref 14.0–49.7)
MCH: 26 pg (ref 25.1–34.0)
MCHC: 30.1 g/dL — ABNORMAL LOW (ref 31.5–36.0)
MCV: 86.3 fL (ref 79.5–101.0)
MONO#: 1.3 10*3/uL — AB (ref 0.1–0.9)
MONO%: 1.2 % (ref 0.0–14.0)
NEUT#: 5.7 10*3/uL (ref 1.5–6.5)
NEUT%: 5.6 % — AB (ref 38.4–76.8)
PLATELETS: 208 10*3/uL (ref 145–400)
RBC: 3.89 10*6/uL (ref 3.70–5.45)
RDW: 15 % — AB (ref 11.2–14.5)
WBC: 100.9 10*3/uL (ref 3.9–10.3)

## 2015-10-07 LAB — TECHNOLOGIST REVIEW

## 2015-10-07 NOTE — Progress Notes (Signed)
Hematology and Oncology Follow Up Visit  Katherine Terrell [id number] August 20, 1961 54 y.o. 10/07/2015 4:07 PM No PCP Per PatientNo ref. provider found   Principle Diagnosis: 54 year old woman with stage 0 CLL diagnosed in 2011. Presented with lymphocytosis and mild splenomegaly.  Current therapy: Observation and surveillance.  Interim History: Katherine Terrell presents today for a followup visit accompanied by an interpreter today. Since her last visit, she reports doing relatively well until about last week. She started developing cough and sinus congestion and diagnosed with bronchitis. She denied any fevers or chills. Denied any weight loss or appetite changes. He was prescribed Robitussin and have helped her symptoms at this time.  She is not reporting any bulky adenopathy or change in her bowel habits. No urinary symptoms. She continues to perform activities of daily living without any hindrance or decline.    She does not report any headaches, blurry vision, syncope or seizures. She does not report any chest pain, palpitation orthopnea. She does not report any cough or hemoptysis. He does not report any nausea, vomiting oral satiety. She does not report any constipation or diarrhea. She does not report any lymphadenopathy or petechiae. Rest of her review of systems unremarkable.  Medications: I have reviewed the patient's current medications.  Current Outpatient Prescriptions  Medication Sig Dispense Refill  . acetaminophen (TYLENOL) 325 MG tablet Take 650 mg by mouth every 6 (six) hours as needed.    . carvedilol (COREG) 6.25 MG tablet Take 6.25 mg by mouth 2 (two) times daily with a meal.    . levothyroxine (SYNTHROID, LEVOTHROID) 25 MCG tablet Take 25 mcg by mouth daily before breakfast.      . lisinopril (PRINIVIL,ZESTRIL) 20 MG tablet Take 20 mg by mouth daily.     No current facility-administered medications for this visit.     Allergies:  Allergies  Allergen Reactions  . Tramadol  Nausea And Vomiting    Past Medical History, Surgical history, Social history, and Family History were reviewed and updated.   Physical Exam: Blood pressure 136/71, pulse 84, temperature 98 F (36.7 C), temperature source Oral, height 5\' 2"  (1.575 m), weight 183 lb 14.4 oz (83.416 kg), SpO2 97 %. ECOG: 0 General appearance: alert awake appeared without distress. Head: Normocephalic, without obvious abnormality no oral ulcers or lesions. Neck: no adenopathy Lymph nodes: Cervical, supraclavicular, and axillary nodes normal. Heart:regular rate and rhythm, S1, S2 normal, no murmur, click, rub or gallop Lung:chest clear, no wheezing, rales, normal symmetric air entry Abdomin: soft, non-tender, without masses or organomegaly no shifting dullness or ascites. Extremities: No edema noted.    Lab Results: Lab Results  Component Value Date   WBC 100.9* 10/07/2015   HGB 10.1* 10/07/2015   HCT 33.5* 10/07/2015   MCV 86.3 10/07/2015   PLT 208 10/07/2015     Chemistry      Component Value Date/Time   NA 143 10/07/2015 1516   NA 134* 05/25/2013 1221   K 3.7 10/07/2015 1516   K 4.5 05/25/2013 1221   CL 100 05/25/2013 1221   CL 107 12/24/2012 1332   CO2 20* 10/07/2015 1516   CO2 26 05/25/2013 1221   BUN 16.3 10/07/2015 1516   BUN 24* 05/25/2013 1221   CREATININE 1.0 10/07/2015 1516   CREATININE 1.20* 05/25/2013 1221   CREATININE 1.05 02/24/2011 1633      Component Value Date/Time   CALCIUM 9.4 10/07/2015 1516   CALCIUM 9.6 05/25/2013 1221   ALKPHOS 119 10/07/2015 1516   ALKPHOS 104  05/25/2013 1221   AST 18 10/07/2015 1516   AST 21 05/25/2013 1221   ALT 11 10/07/2015 1516   ALT 18 05/25/2013 1221   BILITOT 0.66 10/07/2015 1516   BILITOT 0.2* 05/25/2013 1221        Impression and Plan:  54 year old woman with the following issues:  1. Stage 0 CLL presented with a lymphocytosis. Her white cell count continues to rise slowly with a doubling time of close to 2 years. CT  scan of the chest abdomen and pelvis done on 06/04/2015  showed no major changes. She has borderline lymphadenopathy and mild splenomegaly.   She is clinically asymptomatic at this time I do not see any indication for treatment. Indication of treatments were reviewed today that include progressive symptomatic lymphadenopathy, rapid doubling time, cytopenias, constitutional symptoms among others. Opportunistic infections as well as immune dysregulation also indication for treatment. She does not have any of these symptoms at this time and bronchitis appear to be viral in etiology.  I recommended continuing observation and surveillance and a follow-up visit in 4 months.  2. Bronchitis: Appears to be improving with symptomatic relief.  3. Followup: Will be in 4 months.  Zola Button, MD 12/8/20164:07 PM

## 2015-10-07 NOTE — Telephone Encounter (Signed)
per pof to sch pt ppt-gave pt copy of avs °

## 2015-11-27 ENCOUNTER — Emergency Department (HOSPITAL_COMMUNITY): Payer: Self-pay

## 2015-11-27 ENCOUNTER — Inpatient Hospital Stay (HOSPITAL_COMMUNITY)
Admission: EM | Admit: 2015-11-27 | Discharge: 2015-12-03 | DRG: 871 | Disposition: A | Discharge status: Home Health | Payer: Self-pay | Attending: Internal Medicine | Admitting: Internal Medicine

## 2015-11-27 ENCOUNTER — Encounter (HOSPITAL_COMMUNITY): Payer: Self-pay | Admitting: *Deleted

## 2015-11-27 DIAGNOSIS — Z885 Allergy status to narcotic agent status: Secondary | ICD-10-CM

## 2015-11-27 DIAGNOSIS — Z79899 Other long term (current) drug therapy: Secondary | ICD-10-CM

## 2015-11-27 DIAGNOSIS — R1013 Epigastric pain: Secondary | ICD-10-CM | POA: Diagnosis present

## 2015-11-27 DIAGNOSIS — E861 Hypovolemia: Secondary | ICD-10-CM | POA: Diagnosis present

## 2015-11-27 DIAGNOSIS — R000 Tachycardia, unspecified: Secondary | ICD-10-CM

## 2015-11-27 DIAGNOSIS — E860 Dehydration: Secondary | ICD-10-CM | POA: Diagnosis present

## 2015-11-27 DIAGNOSIS — I4729 Other ventricular tachycardia: Secondary | ICD-10-CM

## 2015-11-27 DIAGNOSIS — I429 Cardiomyopathy, unspecified: Secondary | ICD-10-CM | POA: Diagnosis present

## 2015-11-27 DIAGNOSIS — E785 Hyperlipidemia, unspecified: Secondary | ICD-10-CM | POA: Diagnosis present

## 2015-11-27 DIAGNOSIS — E8770 Fluid overload, unspecified: Secondary | ICD-10-CM | POA: Diagnosis present

## 2015-11-27 DIAGNOSIS — I471 Supraventricular tachycardia: Secondary | ICD-10-CM | POA: Diagnosis present

## 2015-11-27 DIAGNOSIS — J1000 Influenza due to other identified influenza virus with unspecified type of pneumonia: Secondary | ICD-10-CM | POA: Diagnosis present

## 2015-11-27 DIAGNOSIS — N179 Acute kidney failure, unspecified: Secondary | ICD-10-CM | POA: Diagnosis present

## 2015-11-27 DIAGNOSIS — I493 Ventricular premature depolarization: Secondary | ICD-10-CM | POA: Diagnosis present

## 2015-11-27 DIAGNOSIS — E042 Nontoxic multinodular goiter: Secondary | ICD-10-CM | POA: Diagnosis present

## 2015-11-27 DIAGNOSIS — E039 Hypothyroidism, unspecified: Secondary | ICD-10-CM | POA: Diagnosis present

## 2015-11-27 DIAGNOSIS — I5032 Chronic diastolic (congestive) heart failure: Secondary | ICD-10-CM | POA: Diagnosis present

## 2015-11-27 DIAGNOSIS — D649 Anemia, unspecified: Secondary | ICD-10-CM | POA: Diagnosis present

## 2015-11-27 DIAGNOSIS — I10 Essential (primary) hypertension: Secondary | ICD-10-CM | POA: Diagnosis present

## 2015-11-27 DIAGNOSIS — Z881 Allergy status to other antibiotic agents status: Secondary | ICD-10-CM

## 2015-11-27 DIAGNOSIS — J101 Influenza due to other identified influenza virus with other respiratory manifestations: Secondary | ICD-10-CM | POA: Diagnosis present

## 2015-11-27 DIAGNOSIS — R0902 Hypoxemia: Secondary | ICD-10-CM | POA: Insufficient documentation

## 2015-11-27 DIAGNOSIS — D696 Thrombocytopenia, unspecified: Secondary | ICD-10-CM | POA: Diagnosis present

## 2015-11-27 DIAGNOSIS — I248 Other forms of acute ischemic heart disease: Secondary | ICD-10-CM | POA: Diagnosis present

## 2015-11-27 DIAGNOSIS — I472 Ventricular tachycardia: Secondary | ICD-10-CM | POA: Diagnosis present

## 2015-11-27 DIAGNOSIS — I5021 Acute systolic (congestive) heart failure: Secondary | ICD-10-CM | POA: Diagnosis present

## 2015-11-27 DIAGNOSIS — Z8249 Family history of ischemic heart disease and other diseases of the circulatory system: Secondary | ICD-10-CM

## 2015-11-27 DIAGNOSIS — J189 Pneumonia, unspecified organism: Secondary | ICD-10-CM | POA: Diagnosis present

## 2015-11-27 DIAGNOSIS — R823 Hemoglobinuria: Secondary | ICD-10-CM | POA: Diagnosis present

## 2015-11-27 DIAGNOSIS — I081 Rheumatic disorders of both mitral and tricuspid valves: Secondary | ICD-10-CM | POA: Diagnosis present

## 2015-11-27 DIAGNOSIS — E7849 Other hyperlipidemia: Secondary | ICD-10-CM | POA: Diagnosis present

## 2015-11-27 DIAGNOSIS — I371 Nonrheumatic pulmonary valve insufficiency: Secondary | ICD-10-CM | POA: Diagnosis present

## 2015-11-27 DIAGNOSIS — D509 Iron deficiency anemia, unspecified: Secondary | ICD-10-CM | POA: Diagnosis present

## 2015-11-27 DIAGNOSIS — I5043 Acute on chronic combined systolic (congestive) and diastolic (congestive) heart failure: Secondary | ICD-10-CM | POA: Diagnosis present

## 2015-11-27 DIAGNOSIS — C9110 Chronic lymphocytic leukemia of B-cell type not having achieved remission: Secondary | ICD-10-CM | POA: Diagnosis present

## 2015-11-27 DIAGNOSIS — I959 Hypotension, unspecified: Secondary | ICD-10-CM | POA: Diagnosis present

## 2015-11-27 DIAGNOSIS — A419 Sepsis, unspecified organism: Principal | ICD-10-CM | POA: Diagnosis present

## 2015-11-27 LAB — BASIC METABOLIC PANEL
Anion gap: 4 — ABNORMAL LOW (ref 5–15)
BUN: 10 mg/dL (ref 6–20)
CHLORIDE: 111 mmol/L (ref 101–111)
CO2: 23 mmol/L (ref 22–32)
CREATININE: 0.98 mg/dL (ref 0.44–1.00)
Calcium: 7.3 mg/dL — ABNORMAL LOW (ref 8.9–10.3)
Glucose, Bld: 120 mg/dL — ABNORMAL HIGH (ref 65–99)
Potassium: 3.3 mmol/L — ABNORMAL LOW (ref 3.5–5.1)
Sodium: 138 mmol/L (ref 135–145)

## 2015-11-27 LAB — URINE MICROSCOPIC-ADD ON: BACTERIA UA: NONE SEEN

## 2015-11-27 LAB — URINALYSIS, ROUTINE W REFLEX MICROSCOPIC
Bilirubin Urine: NEGATIVE
GLUCOSE, UA: NEGATIVE mg/dL
Ketones, ur: NEGATIVE mg/dL
Leukocytes, UA: NEGATIVE
Nitrite: NEGATIVE
PH: 5 (ref 5.0–8.0)
Protein, ur: 100 mg/dL — AB
SPECIFIC GRAVITY, URINE: 1.014 (ref 1.005–1.030)

## 2015-11-27 LAB — CBC
HCT: 28.3 % — ABNORMAL LOW (ref 36.0–46.0)
HEMOGLOBIN: 8.7 g/dL — AB (ref 12.0–15.0)
MCH: 26.8 pg (ref 26.0–34.0)
MCHC: 30.7 g/dL (ref 30.0–36.0)
MCV: 87.1 fL (ref 78.0–100.0)
PLATELETS: 108 10*3/uL — AB (ref 150–400)
RBC: 3.25 MIL/uL — ABNORMAL LOW (ref 3.87–5.11)
RDW: 15.3 % (ref 11.5–15.5)
WBC: 68.7 10*3/uL (ref 4.0–10.5)

## 2015-11-27 LAB — COMPREHENSIVE METABOLIC PANEL
ALBUMIN: 4.1 g/dL (ref 3.5–5.0)
ALT: 16 U/L (ref 14–54)
AST: 32 U/L (ref 15–41)
Alkaline Phosphatase: 102 U/L (ref 38–126)
Anion gap: 12 (ref 5–15)
BUN: 13 mg/dL (ref 6–20)
CHLORIDE: 105 mmol/L (ref 101–111)
CO2: 21 mmol/L — AB (ref 22–32)
Calcium: 8.9 mg/dL (ref 8.9–10.3)
Creatinine, Ser: 1.14 mg/dL — ABNORMAL HIGH (ref 0.44–1.00)
GFR calc Af Amer: >60 mL/min (ref >60)
GFR calc non Af Amer: 54 mL/min — ABNORMAL LOW (ref >60)
GLUCOSE: 133 mg/dL — AB (ref 65–99)
POTASSIUM: 3.8 mmol/L (ref 3.5–5.1)
Sodium: 138 mmol/L (ref 135–145)
Total Bilirubin: 0.8 mg/dL (ref 0.3–1.2)
Total Protein: 7.8 g/dL (ref 6.5–8.1)

## 2015-11-27 LAB — MAGNESIUM: Magnesium: 1.8 mg/dL (ref 1.7–2.4)

## 2015-11-27 LAB — BRAIN NATRIURETIC PEPTIDE: B NATRIURETIC PEPTIDE 5: 1803.4 pg/mL — AB (ref 0.0–100.0)

## 2015-11-27 LAB — TROPONIN I: TROPONIN I: 0.14 ng/mL — AB (ref <0.031)

## 2015-11-27 LAB — I-STAT CG4 LACTIC ACID, ED
LACTIC ACID, VENOUS: 0.8 mmol/L (ref 0.5–2.0)
LACTIC ACID, VENOUS: 0.52 mmol/L (ref 0.5–2.0)

## 2015-11-27 LAB — TSH: TSH: 2.087 u[IU]/mL (ref 0.350–4.500)

## 2015-11-27 LAB — PHOSPHORUS: Phosphorus: 3.5 mg/dL (ref 2.5–4.6)

## 2015-11-27 LAB — MRSA PCR SCREENING: MRSA BY PCR: NEGATIVE

## 2015-11-27 MED ORDER — SODIUM CHLORIDE 0.9 % IV BOLUS (SEPSIS)
1000.0000 mL | Freq: Once | INTRAVENOUS | Status: AC
  Administered 2015-11-27: 1000 mL via INTRAVENOUS

## 2015-11-27 MED ORDER — SODIUM CHLORIDE 0.9 % IV BOLUS (SEPSIS)
500.0000 mL | Freq: Once | INTRAVENOUS | Status: AC
  Administered 2015-11-27: 500 mL via INTRAVENOUS

## 2015-11-27 MED ORDER — CARVEDILOL 6.25 MG PO TABS
6.2500 mg | ORAL_TABLET | Freq: Two times a day (BID) | ORAL | Status: DC
  Administered 2015-11-28: 6.25 mg via ORAL
  Filled 2015-11-27: qty 1

## 2015-11-27 MED ORDER — LISINOPRIL 10 MG PO TABS: 20.0000 mg | ORAL_TABLET | Freq: Every day | ORAL | Status: DC

## 2015-11-27 MED ORDER — IBUPROFEN 200 MG PO TABS
400.0000 mg | ORAL_TABLET | Freq: Once | ORAL | Status: AC
  Administered 2015-11-27: 400 mg via ORAL
  Filled 2015-11-27: qty 2

## 2015-11-27 MED ORDER — DEXTROSE 5 % IV SOLN
1.0000 g | INTRAVENOUS | Status: DC
  Administered 2015-11-28 – 2015-12-02 (×5): 1 g via INTRAVENOUS
  Filled 2015-11-27 (×6): qty 10

## 2015-11-27 MED ORDER — KETOROLAC TROMETHAMINE 30 MG/ML IJ SOLN
30.0000 mg | Freq: Once | INTRAMUSCULAR | Status: AC
  Administered 2015-11-27: 30 mg via INTRAVENOUS
  Filled 2015-11-27: qty 1

## 2015-11-27 MED ORDER — ACETAMINOPHEN 325 MG PO TABS
650.0000 mg | ORAL_TABLET | Freq: Four times a day (QID) | ORAL | Status: DC | PRN
  Administered 2015-11-28 – 2015-12-03 (×14): 650 mg via ORAL
  Filled 2015-11-27 (×14): qty 2

## 2015-11-27 MED ORDER — OSELTAMIVIR PHOSPHATE 75 MG PO CAPS
75.0000 mg | ORAL_CAPSULE | Freq: Two times a day (BID) | ORAL | Status: AC
Start: 2015-11-27 — End: 2015-12-02
  Administered 2015-11-27 – 2015-12-02 (×10): 75 mg via ORAL
  Filled 2015-11-27 (×11): qty 1

## 2015-11-27 MED ORDER — PANTOPRAZOLE SODIUM 40 MG PO TBEC
40.0000 mg | DELAYED_RELEASE_TABLET | Freq: Every day | ORAL | Status: DC
Start: 2015-11-27 — End: 2015-12-03
  Administered 2015-11-27 – 2015-12-03 (×7): 40 mg via ORAL
  Filled 2015-11-27 (×7): qty 1

## 2015-11-27 MED ORDER — POTASSIUM CHLORIDE 10 MEQ/100ML IV SOLN
10.0000 meq | Freq: Once | INTRAVENOUS | Status: AC
  Administered 2015-11-27: 10 meq via INTRAVENOUS

## 2015-11-27 MED ORDER — POTASSIUM CHLORIDE 20 MEQ/15ML (10%) PO SOLN
40.0000 meq | Freq: Once | ORAL | Status: DC
  Filled 2015-11-27: qty 30

## 2015-11-27 MED ORDER — DEXTROSE 5 % IV SOLN
500.0000 mg | INTRAVENOUS | Status: DC
  Administered 2015-11-28 – 2015-12-02 (×5): 500 mg via INTRAVENOUS
  Filled 2015-11-27 (×6): qty 500

## 2015-11-27 MED ORDER — ENOXAPARIN SODIUM 40 MG/0.4ML ~~LOC~~ SOLN
40.0000 mg | SUBCUTANEOUS | Status: DC
  Administered 2015-11-27: 40 mg via SUBCUTANEOUS
  Filled 2015-11-27: qty 0.4

## 2015-11-27 MED ORDER — POTASSIUM CHLORIDE 10 MEQ/100ML IV SOLN
INTRAVENOUS | Status: AC
  Filled 2015-11-27: qty 100

## 2015-11-27 MED ORDER — LEVOTHYROXINE SODIUM 50 MCG PO TABS
50.0000 ug | ORAL_TABLET | Freq: Every day | ORAL | Status: DC
  Administered 2015-11-28 – 2015-12-03 (×6): 50 ug via ORAL
  Filled 2015-11-27 (×6): qty 1

## 2015-11-27 MED ORDER — METOPROLOL TARTRATE 1 MG/ML IV SOLN
5.0000 mg | Freq: Once | INTRAVENOUS | Status: AC
  Administered 2015-11-27: 5 mg via INTRAVENOUS
  Filled 2015-11-27: qty 5

## 2015-11-27 MED ORDER — GUAIFENESIN-DM 100-10 MG/5ML PO SYRP
5.0000 mL | ORAL_SOLUTION | ORAL | Status: DC | PRN
  Administered 2015-11-28 – 2015-12-03 (×11): 5 mL via ORAL
  Filled 2015-11-27 (×12): qty 10

## 2015-11-27 MED ORDER — ACETAMINOPHEN 325 MG PO TABS
650.0000 mg | ORAL_TABLET | Freq: Once | ORAL | Status: AC
  Administered 2015-11-27: 650 mg via ORAL
  Filled 2015-11-27: qty 2

## 2015-11-27 MED ORDER — ONDANSETRON HCL 4 MG/2ML IJ SOLN
4.0000 mg | Freq: Four times a day (QID) | INTRAMUSCULAR | Status: DC | PRN
  Administered 2015-11-27 – 2015-12-02 (×5): 4 mg via INTRAVENOUS
  Filled 2015-11-27 (×6): qty 2

## 2015-11-27 MED ORDER — MAGNESIUM SULFATE 2 GM/50ML IV SOLN
2.0000 g | Freq: Once | INTRAVENOUS | Status: AC
  Administered 2015-11-27: 2 g via INTRAVENOUS
  Filled 2015-11-27: qty 50

## 2015-11-27 MED ORDER — CEFTRIAXONE SODIUM 1 G IJ SOLR
1.0000 g | Freq: Once | INTRAMUSCULAR | Status: AC
  Administered 2015-11-27: 1 g via INTRAVENOUS
  Filled 2015-11-27: qty 10

## 2015-11-27 MED ORDER — DEXTROSE 5 % IV SOLN
500.0000 mg | Freq: Once | INTRAVENOUS | Status: AC
  Administered 2015-11-27: 500 mg via INTRAVENOUS
  Filled 2015-11-27: qty 500

## 2015-11-27 MED ORDER — POTASSIUM CHLORIDE IN NACL 20-0.9 MEQ/L-% IV SOLN
INTRAVENOUS | Status: DC
  Administered 2015-11-27 – 2015-11-28 (×2): via INTRAVENOUS
  Filled 2015-11-27 (×2): qty 1000

## 2015-11-27 MED ORDER — MAGNESIUM SULFATE 2 GM/50ML IV SOLN
2.0000 g | Freq: Once | INTRAVENOUS | Status: AC
Start: 2015-11-27 — End: 2015-11-28
  Administered 2015-11-27: 2 g via INTRAVENOUS
  Filled 2015-11-27: qty 50

## 2015-11-27 NOTE — H&P (Signed)
[facility] History and Physical  Katherine Terrell [id number] DOB: 16-Nov-1960 DOA: 11/27/2015  Referring physician: Lajean Saver, MD PCP: No PCP Per Patient   Chief Complaint: Fever and cough.  HPI: Katherine Terrell is a 55 y.o. female with a past medical history of hypertension, hyperlipidemia, multinodular goiter with hypothyroidism, cardiomyopathy with EF of 25-30% on November 2011, for follow-up echocardiogram in February 2012 showed an EF of 55-60% with moderate LVH and grade 1 diastolic dysfunction who is coming to the emergency department with above symptoms for several days.  Per patient, earlier this week she gave a ride to one of her coworkers who was feeling ill. Her coworker apparently was sneezing and coughing in the vehicle. The patient states that her coworker missed work following that due to not feeling well. She is states that she has been feeling progressively worse this week with fevers, chills, fatigue, body aches, headache, nasal congestion and postnasal drip. She went to her provider's clinic in [place], [place] this morning, who diagnosed her with influenza B and prescribed her Tamiflu 75 mg by mouth, but she has not been able to get this prescription yet. She is states that her symptoms continued to get worse, so she decided to come to the emergency department.  In the emergency department, workup showed that she also has a LLL infiltrate and had an oxygen requirement. When I went into the room to see the patient, she was hypoxic at 89-91 percent, had a fluctuating tachycardia ranging from the 120s to  over 160 bpm at one point. This improved after she was put on supplemental oxygen by nasal cannula, given metoprolol 5 mg IVP 1 (since the patient missed her last 2 doses of carvedilol 6.25 mg), magnesium sulfate and supplemental potassium. Her heart rate and rhythm became better with this, but her systolic blood pressure dropped from around 118 mmHg to the 70s. Normal  saline boluses increased the blood pressure to the high 90s to low 100s. She looks acutely ill, but is currently in no acute distress   Review of Systems:  Constitutional:  Positive for night sweats, Fevers, chills, fatigue.  No weight loss,  HEENT:  Positive for nasal congestion, post nasal drip,  No headaches, Difficulty swallowing,Tooth/dental problems,Sore throat,  No sneezing, itching, ear ache,  Cardio-vascular:  Positive for dizziness, palpitations  No chest pain, Orthopnea, PND, swelling in lower extremities, anasarca,  GI:  Positive for nausea, loss of appetite  No heartburn, indigestion, abdominal pain,  vomiting, diarrhea, change in bowel habits Resp:  Positive dyspnea, positive productive cough of yellowish sputum, no wheezing, no hemoptysis. Skin:  no rash or lesions.  GU:  no dysuria, change in color of urine, no urgency or frequency. No flank pain.  Musculoskeletal:  Positive for generalized myalgia. No decreased range of motion. No back pain.  Psych:  No change in mood or affect. No depression or anxiety. No memory loss.   Past Medical History  Diagnosis Date  . Hyperlipidemia   . Hypertension   . Multinodular goiter     with hypothyroidism  . Cardiomyopathy     echo: (11/11) EF 25-30% with diffuse hypokinesis, mild left atrial enlargement. 11/11 HIV and ANA negative. TSH normal. LHC (1/12): EF 50-55% no angiographic CAD. Echo (2/12): EF 55-60% moderate LV hypertrophy, grade I diastolic dysfunction, mild MR, normal RV  . Chronic lymphocytic leukemia ([place])     followed by Dr. Ralene Ok   Past Surgical History  Procedure Laterality Date  . Cardiac catheterization  2012    @ [facility]   Social History:  reports that she has never smoked. She has never used smokeless tobacco. She reports that she does not drink alcohol or use illicit drugs.  Allergies  Allergen Reactions  . Tramadol Nausea And Vomiting  . Other     Antibiotic, patient doesn't know which  medication but it caused major fatigue    Family History  Problem Relation Age of Onset  . Heart attack Mother 47    Prior to Admission medications   Medication Sig Start Date End Date Taking? Authorizing Provider  acetaminophen (TYLENOL) 325 MG tablet Take 650 mg by mouth every 6 (six) hours as needed for moderate pain or headache.    Yes Historical Provider, MD  carvedilol (COREG) 6.25 MG tablet Take 6.25 mg by mouth 2 (two) times daily with a meal.   Yes Historical Provider, MD  levothyroxine (SYNTHROID, LEVOTHROID) 50 MCG tablet Take 50 mcg by mouth daily before breakfast.   Yes Historical Provider, MD  lisinopril (PRINIVIL,ZESTRIL) 20 MG tablet Take 20 mg by mouth daily.   Yes Historical Provider, MD   Physical Exam: Filed Vitals:   11/27/15 1857 11/27/15 1900 11/27/15 1930 11/27/15 2024  BP:  118/76 82/67 77/59   Pulse:  117 40 102  Temp: 98.5 F (36.9 C)   98.7 F (37.1 C)  TempSrc: Oral   Oral  Resp:  31 31 20   SpO2:  89% 95% 97%    Wt Readings from Last 3 Encounters:  10/07/15 83.416 kg (183 lb 14.4 oz)  06/08/15 87.59 kg (193 lb 1.6 oz)  03/26/15 88.225 kg (194 lb 8 oz)    General:  Appears acutely ill. Eyes: PERRL, normal lids, irises & conjunctiva ENT: grossly normal hearing, lips are dry, oral mucosae is moist after hydration. Neck: No JVD, no LAD, masses or thyromegaly Cardiovascular: Irregular, no m/r/g. No LE edema. Telemetry: Tachycardic with multiple runs of PVCs per minute. Respiratory: Decreased breath sounds on bases, no w/r/r. Normal respiratory effort. Abdomen: soft, ntnd Skin: no rash or induration seen on limited exam Musculoskeletal: grossly normal tone BUE/BLE Psychiatric: grossly normal mood and affect, speech fluent and appropriate Neurologic: Awake, alert, oriented 3, grossly non-focal.          Labs on Admission:  Basic Metabolic Panel:  Recent Labs Lab 11/27/15 1452  NA 138  K 3.8  CL 105  CO2 21*  GLUCOSE 133*  BUN 13    CREATININE 1.14*  CALCIUM 8.9   Liver Function Tests:  Recent Labs Lab 11/27/15 1452  AST 32  ALT 16  ALKPHOS 102  BILITOT 0.8  PROT 7.8  ALBUMIN 4.1    Radiological Exams on Admission: Dg Chest 2 View  11/27/2015  CLINICAL DATA:  Cough and fever for 2 weeks. Multiple hypoxia. Cardiomyopathy and chronic systolic heart failure. Chronic lymphocytic leukemia. EXAM: CHEST  2 VIEW COMPARISON:  11/01/2010 FINDINGS: Mild cardiomegaly and ectasia of the thoracic aorta noted. Asymmetric streaky opacity is seen in the posterior left lower lobe, suspicious for pneumonia. No evidence of pneumothorax or pleural effusion. IMPRESSION: Posterior left lower lobe airspace opacity, suspicious for pneumonia. Followup PA and lateral chest X-ray is recommended in 3-4 weeks following trial of antibiotic therapy to ensure resolution and exclude underlying malignancy. Mild cardiomegaly. Electronically Signed   By: Earle Gell M.D.   On: 11/27/2015 14:40  Echocardiogram 12/20/2010  History: PMH: Acquired from the patient's chart. Risk factors: 2D Echo in 08/2010 showed EF 25-30%,  cardiomyopathy of unclear etiology. 11/28/10 Cardiac cath showed normal coronaries, EF 50-55%. Lifelong nonsmoker. Hypertension. Dyslipidemia.  -------------------------------------------------------------------- Study Conclusions  - Left ventricle: The cavity size was normal. There was moderate concentric hypertrophy. Systolic function was low normal. The estimated ejection fraction was in the range of 50% to 55%. Wall motion was normal; there were no regional wall motion abnormalities. Doppler parameters are consistent with abnormal left ventricular relaxation (grade 1 diastolic dysfunction). - Aortic valve: Trivial regurgitation. - Mitral valve: Mild regurgitation. - Left atrium: The atrium was mildly dilated. - Right ventricle: Systolic function was normal. - Tricuspid valve: Mild regurgitation. - Pulmonic  valve: Mild regurgitation. Transthoracic echocardiography. M-mode, complete 2D, spectral Doppler, and color Doppler. Height: Height: 162.6cm. Height: 64in. Weight: Weight: 85.3kg. Weight: 187.6lb. Body mass index: BMI: 32.3kg/m^2. Body surface area: BSA: 1.7m^2. Blood pressure: 118/80. Patient status: Outpatient. Location: [facility].  EKG: Independently reviewed.  Vent. rate 125 BPM PR interval * ms QRS duration 96 ms QT/QTc 355/512 ms P-R-T axes -1 64 -85 Sinus rhythm Premature ventricular complexes Non-specific ST-t changes  Assessment/Plan Principal Problem:   CAP (community acquired pneumonia) Admit to a stepdown. Continue supplemental oxygen. Continue Rocephin 1 g every 24 hours. Continue Zithromax 500 mg IV every 24 hours. Check a sputum culture and sensitivity. Follow-up blood cultures and sensitivity. Check Legionella and strep pneumoniae urine antigens.  Active Problems:   Ventricular tachycardia, non-sustained (HCC) Keep electrolytes optimized. K over 4.0 and magnesium over 2.0 Check BNP and troponin level. Resume carvedilol once the blood pressure allows it. Cardiology was consulted and will evaluate the patient in a.m. Check echocardiogram.  Chronic diastolic CHF (grade 1 per last echo) Monitor input and output. Check BNP level. Check echocardiogram in the morning. Resume beta blocker and ACE inhibitor once the blood pressure is better.    Influenza B Start Tamiflu 75 mg by mouth twice a day.    Hypothyroidism Per patient, she was recently increased to 50 g by mouth daily Check TSH level.    CLL (chronic lymphocytic leukemia) (HCC) Stable. Follow-up WBC. Continue surveillance as per hematology/oncology.      Hyperlipidemia Not on antihyperlipidemics at this time.    Essential hypertension She has not taken her antihypertensive medicines since yesterday morning. Hold carvedilol and lisinopril for the moment.      Dehydration Continue  IV hydration.    Dr. Marlowe Sax, Cardiology was consulted.    Critical care (Dr. Janann Colonel) )was made aware of the presence of the  patient in the stepdown unit, but not officially consulted yet.   Code Status: Full code. DVT Prophylaxis: Lovenox SQ. Family Communication: Her daughter and son-in-law were present in the room. Disposition Plan: Admit to a stepdown for close monitoring. Keep electrolytes optimized. Continue antibiotic treatment for influenza B and community-acquired pneumonia.  Time spent: Over 120 minutes were spent during the process of this admission.  Reubin Milan [facility] Pager [phone].

## 2015-11-27 NOTE — ED Provider Notes (Addendum)
CSN: [id number]     Arrival date & time 11/27/15  1405 History   First MD Initiated Contact with Patient 11/27/15 1500     Chief Complaint  Patient presents with  . Fever  . Cough     (Consider location/radiation/quality/duration/timing/severity/associated sxs/prior Treatment) Patient is a 55 y.o. female presenting with fever and cough. The history is provided by the patient and a relative. A language interpreter was used.  Fever Associated symptoms: congestion, cough and headaches   Associated symptoms: no chest pain, no chills, no confusion, no dysuria, no rash and no sore throat   Cough Associated symptoms: fever and headaches   Associated symptoms: no chest pain, no chills, no eye discharge, no rash, no shortness of breath and no sore throat   Patient w onset cough, and body aches in the past week. In the past couple days, also w fever. Intermittent headaches, gradual onset, diffuse, moderate. No neck pain or stiffness. No sore throat or trouble swallowing. Denies sob or chest pain. No hx chronic lung disease. HX CLL, states follows up q 3-6 months, no recent tx.  Non smoker. No known ill contacts. Nausea. ?couple episodes vomiting/diarrhea yesterday. No abd pain. No dysuria or gu c/o. No rash.      Past Medical History  Diagnosis Date  . Hyperlipidemia   . Hypertension   . Multinodular goiter     with hypothyroidism  . Cardiomyopathy     echo: (11/11) EF 25-30% with diffuse hypokinesis, mild left atrial enlargement. 11/11 HIV and ANA negative. TSH normal. LHC (1/12): EF 50-55% no angiographic CAD. Echo (2/12): EF 55-60% moderate LV hypertrophy, grade I diastolic dysfunction, mild MR, normal RV  . Chronic lymphocytic leukemia ([place])     followed by Dr. Ralene Ok   Past Surgical History  Procedure Laterality Date  . Cardiac catheterization  2012    @ [facility]   Family History  Problem Relation Age of Onset  . Heart attack Mother 4   Social History  Substance Use Topics   . Smoking status: Never Smoker   . Smokeless tobacco: Never Used  . Alcohol Use: No   OB History    Gravida Para Term Preterm AB TAB SAB Ectopic Multiple Living   3 3 3       3      Review of Systems  Constitutional: Positive for fever. Negative for chills.  HENT: Positive for congestion. Negative for sore throat.   Eyes: Negative for pain, discharge and redness.  Respiratory: Positive for cough. Negative for shortness of breath.   Cardiovascular: Negative for chest pain and leg swelling.  Gastrointestinal: Negative for abdominal pain.  Genitourinary: Negative for dysuria and flank pain.  Musculoskeletal: Negative for back pain, neck pain and neck stiffness.  Skin: Negative for rash.  Neurological: Positive for headaches.  Hematological: Does not bruise/bleed easily.  Psychiatric/Behavioral: Negative for confusion.      Allergies  Tramadol and Other  Home Medications   Prior to Admission medications   Medication Sig Start Date End Date Taking? Authorizing Provider  acetaminophen (TYLENOL) 325 MG tablet Take 650 mg by mouth every 6 (six) hours as needed for moderate pain or headache.    Yes Historical Provider, MD  carvedilol (COREG) 6.25 MG tablet Take 6.25 mg by mouth 2 (two) times daily with a meal.   Yes Historical Provider, MD  levothyroxine (SYNTHROID, LEVOTHROID) 50 MCG tablet Take 50 mcg by mouth daily before breakfast.   Yes Historical Provider, MD  lisinopril (  PRINIVIL,ZESTRIL) 20 MG tablet Take 20 mg by mouth daily.   Yes Historical Provider, MD   BP 126/95 mmHg  Pulse 54  Temp(Src) 102.1 F (38.9 C) (Oral)  Resp 24  SpO2 88% Physical Exam  Constitutional: She appears well-developed and well-nourished. No distress.  HENT:  Mouth/Throat: Oropharynx is clear and moist.  No sinus or temporal tenderness.   Eyes: Conjunctivae and EOM are normal. Pupils are equal, round, and reactive to light. No scleral icterus.  Neck: Normal range of motion. Neck supple. No  tracheal deviation present.  No stiffness or rigidity  Cardiovascular: Regular rhythm, normal heart sounds and intact distal pulses.  Exam reveals no gallop and no friction rub.   No murmur heard. Pulmonary/Chest: Effort normal. No respiratory distress.  Non prod cough. Upper resp congestion.   Abdominal: Soft. Normal appearance and bowel sounds are normal. She exhibits no distension. There is no tenderness.  Genitourinary:  No cva tenderness  Musculoskeletal: She exhibits no edema.  Neurological: She is alert.  Responds to questions appropriately. Steady gait.  Skin: Skin is warm and dry. No rash noted.  Psychiatric: She has a normal mood and affect.  Nursing note and vitals reviewed.   ED Course  Procedures (including critical care time) Labs Review  Results for orders placed or performed during the hospital encounter of 11/27/15  Comprehensive metabolic panel  Result Value Ref Range   Sodium 138 135 - 145 mmol/L   Potassium 3.8 3.5 - 5.1 mmol/L   Chloride 105 101 - 111 mmol/L   CO2 21 (L) 22 - 32 mmol/L   Glucose, Bld 133 (H) 65 - 99 mg/dL   BUN 13 6 - 20 mg/dL   Creatinine, Ser 1.14 (H) 0.44 - 1.00 mg/dL   Calcium 8.9 8.9 - 10.3 mg/dL   Total Protein 7.8 6.5 - 8.1 g/dL   Albumin 4.1 3.5 - 5.0 g/dL   AST 32 15 - 41 U/L   ALT 16 14 - 54 U/L   Alkaline Phosphatase 102 38 - 126 U/L   Total Bilirubin 0.8 0.3 - 1.2 mg/dL   GFR calc non Af Amer 54 (L) >60 mL/min   GFR calc Af Amer >60 >60 mL/min   Anion gap 12 5 - 15  Urinalysis, Routine w reflex microscopic (not at [facility])  Result Value Ref Range   Color, Urine YELLOW YELLOW   APPearance CLEAR CLEAR   Specific Gravity, Urine 1.014 1.005 - 1.030   pH 5.0 5.0 - 8.0   Glucose, UA NEGATIVE NEGATIVE mg/dL   Hgb urine dipstick LARGE (A) NEGATIVE   Bilirubin Urine NEGATIVE NEGATIVE   Ketones, ur NEGATIVE NEGATIVE mg/dL   Protein, ur 100 (A) NEGATIVE mg/dL   Nitrite NEGATIVE NEGATIVE   Leukocytes, UA NEGATIVE NEGATIVE  Urine  microscopic-add on  Result Value Ref Range   Squamous Epithelial / LPF 0-5 (A) NONE SEEN   WBC, UA 0-5 0 - 5 WBC/hpf   RBC / HPF 0-5 0 - 5 RBC/hpf   Bacteria, UA NONE SEEN NONE SEEN  I-Stat CG4 Lactic Acid, ED  Result Value Ref Range   Lactic Acid, Venous 0.80 0.5 - 2.0 mmol/L   Dg Chest 2 View  11/27/2015  CLINICAL DATA:  Cough and fever for 2 weeks. Multiple hypoxia. Cardiomyopathy and chronic systolic heart failure. Chronic lymphocytic leukemia. EXAM: CHEST  2 VIEW COMPARISON:  11/01/2010 FINDINGS: Mild cardiomegaly and ectasia of the thoracic aorta noted. Asymmetric streaky opacity is seen in the posterior left  lower lobe, suspicious for pneumonia. No evidence of pneumothorax or pleural effusion. IMPRESSION: Posterior left lower lobe airspace opacity, suspicious for pneumonia. Followup PA and lateral chest X-ray is recommended in 3-4 weeks following trial of antibiotic therapy to ensure resolution and exclude underlying malignancy. Mild cardiomegaly. Electronically Signed   By: Earle Gell M.D.   On: 11/27/2015 14:40       I have personally reviewed and evaluated these images and lab results as part of my medical decision-making.   EKG Interpretation   Date/Time:  Saturday November 27 2015 15:31:53 EST Ventricular Rate:  125 PR Interval:    QRS Duration: 96 QT Interval:  355 QTC Calculation: 512 R Axis:   64 Text Interpretation:  Sinus rhythm Premature ventricular complexes  Non-specific ST-t changes Confirmed by Ashok Cordia  MD, Lennette Bihari ([id number]) on  11/27/2015 3:53:19 PM      MDM   Iv ns bolus. Labs. Cxsr.  Reviewed nursing notes and prior charts for additional history.   Reviewed records from outside facility/UC - today, flu test positive for infl B.  From outside facility, cbc today w wbc 12.   Patient w focal infiltrate on cxr, ?superimposed bact pna/cap.  cxs sent.  Rocephin and zithromax iv.    Frequent pvcs on monitor. Few small runs vt < 10 beats. No faintness. No  chest pain. No hx syncope.  Room air pulse ox 88%.   Given hypoxia, pna, will admit.       Lajean Saver, MD 11/27/15 [phone]

## 2015-11-27 NOTE — ED Notes (Signed)
Zithromax placed on hold while other medications were being administered.

## 2015-11-27 NOTE — ED Notes (Addendum)
Pt's family reports pt went to UC today for cough x 1 week and not feeling well.  Pt was instructed to come to the ED d/t hypoxia and fever.  ?flu.  Pt also reports generalized body aches and h/a

## 2015-11-27 NOTE — ED Notes (Signed)
Pt assisted to BR & back to room

## 2015-11-27 NOTE — ED Notes (Signed)
Pt c/o nausea.  

## 2015-11-27 NOTE — ED Notes (Signed)
RN will draw from line

## 2015-11-28 ENCOUNTER — Inpatient Hospital Stay (HOSPITAL_COMMUNITY): Payer: Self-pay

## 2015-11-28 DIAGNOSIS — A419 Sepsis, unspecified organism: Principal | ICD-10-CM

## 2015-11-28 DIAGNOSIS — I425 Other restrictive cardiomyopathy: Secondary | ICD-10-CM

## 2015-11-28 DIAGNOSIS — E039 Hypothyroidism, unspecified: Secondary | ICD-10-CM

## 2015-11-28 LAB — COMPREHENSIVE METABOLIC PANEL
ALBUMIN: 3.2 g/dL — AB (ref 3.5–5.0)
ALT: 16 U/L (ref 14–54)
ANION GAP: 6 (ref 5–15)
AST: 29 U/L (ref 15–41)
Alkaline Phosphatase: 77 U/L (ref 38–126)
BILIRUBIN TOTAL: 0.7 mg/dL (ref 0.3–1.2)
BUN: 10 mg/dL (ref 6–20)
CO2: 22 mmol/L (ref 22–32)
Calcium: 7.7 mg/dL — ABNORMAL LOW (ref 8.9–10.3)
Chloride: 110 mmol/L (ref 101–111)
Creatinine, Ser: 0.93 mg/dL (ref 0.44–1.00)
GFR calc Af Amer: >60 mL/min (ref >60)
GFR calc non Af Amer: >60 mL/min (ref >60)
GLUCOSE: 123 mg/dL — AB (ref 65–99)
POTASSIUM: 4.3 mmol/L (ref 3.5–5.1)
SODIUM: 138 mmol/L (ref 135–145)
TOTAL PROTEIN: 6.2 g/dL — AB (ref 6.5–8.1)

## 2015-11-28 LAB — EXPECTORATED SPUTUM ASSESSMENT W REFEX TO RESP CULTURE: SPECIAL REQUESTS: NORMAL

## 2015-11-28 LAB — STREP PNEUMONIAE URINARY ANTIGEN: STREP PNEUMO URINARY ANTIGEN: NEGATIVE

## 2015-11-28 LAB — CBC WITH DIFFERENTIAL/PLATELET
BASOS ABS: 0 10*3/uL (ref 0.0–0.1)
Basophils Relative: 0 %
Eosinophils Absolute: 0 10*3/uL (ref 0.0–0.7)
Eosinophils Relative: 0 %
HCT: 31.2 % — ABNORMAL LOW (ref 36.0–46.0)
HEMOGLOBIN: 9.4 g/dL — AB (ref 12.0–15.0)
LYMPHS PCT: 94 %
Lymphs Abs: 63.8 10*3/uL — ABNORMAL HIGH (ref 0.7–4.0)
MCH: 26.5 pg (ref 26.0–34.0)
MCHC: 30.1 g/dL (ref 30.0–36.0)
MCV: 87.9 fL (ref 78.0–100.0)
MONOS PCT: 0 %
Monocytes Absolute: 0 10*3/uL — ABNORMAL LOW (ref 0.1–1.0)
NEUTROS ABS: 4.1 10*3/uL (ref 1.7–7.7)
Neutrophils Relative %: 6 %
Platelets: 114 10*3/uL — ABNORMAL LOW (ref 150–400)
RBC: 3.55 MIL/uL — ABNORMAL LOW (ref 3.87–5.11)
RDW: 15.5 % (ref 11.5–15.5)
WBC: 67.9 10*3/uL (ref 4.0–10.5)

## 2015-11-28 LAB — TROPONIN I
Troponin I: 0.1 ng/mL — ABNORMAL HIGH (ref <0.031)
Troponin I: 0.09 ng/mL — ABNORMAL HIGH (ref <0.031)
Troponin I: 0.11 ng/mL — ABNORMAL HIGH (ref <0.031)

## 2015-11-28 LAB — OCCULT BLOOD X 1 CARD TO LAB, STOOL
Fecal Occult Bld: NEGATIVE
Fecal Occult Bld: NEGATIVE

## 2015-11-28 LAB — HIV ANTIBODY (ROUTINE TESTING W REFLEX): HIV SCREEN 4TH GENERATION: NONREACTIVE

## 2015-11-28 LAB — MAGNESIUM: Magnesium: 2.1 mg/dL (ref 1.7–2.4)

## 2015-11-28 LAB — URINE CULTURE

## 2015-11-28 MED ORDER — FUROSEMIDE 10 MG/ML IJ SOLN
40.0000 mg | Freq: Once | INTRAMUSCULAR | Status: AC
  Administered 2015-11-28: 40 mg via INTRAVENOUS
  Filled 2015-11-28: qty 4

## 2015-11-28 MED ORDER — CARVEDILOL 3.125 MG PO TABS
3.1250 mg | ORAL_TABLET | Freq: Two times a day (BID) | ORAL | Status: DC
  Administered 2015-11-29 – 2015-12-03 (×7): 3.125 mg via ORAL
  Filled 2015-11-28 (×8): qty 1

## 2015-11-28 MED ORDER — FUROSEMIDE 40 MG PO TABS
40.0000 mg | ORAL_TABLET | Freq: Every day | ORAL | Status: DC
  Administered 2015-11-29 – 2015-12-03 (×5): 40 mg via ORAL
  Filled 2015-11-28 (×5): qty 1

## 2015-11-28 MED ORDER — POTASSIUM CHLORIDE CRYS ER 20 MEQ PO TBCR
40.0000 meq | EXTENDED_RELEASE_TABLET | Freq: Once | ORAL | Status: AC
  Administered 2015-11-28: 40 meq via ORAL
  Filled 2015-11-28: qty 2

## 2015-11-28 MED ORDER — ENSURE ENLIVE PO LIQD
237.0000 mL | Freq: Two times a day (BID) | ORAL | Status: DC
  Administered 2015-11-29 – 2015-12-03 (×5): 237 mL via ORAL

## 2015-11-28 MED ORDER — DM-GUAIFENESIN ER 30-600 MG PO TB12
1.0000 | ORAL_TABLET | Freq: Two times a day (BID) | ORAL | Status: DC
  Administered 2015-11-28 – 2015-12-03 (×11): 1 via ORAL
  Filled 2015-11-28 (×12): qty 1

## 2015-11-28 MED ORDER — ALBUTEROL SULFATE (2.5 MG/3ML) 0.083% IN NEBU: 2.5000 mg | INHALATION_SOLUTION | RESPIRATORY_TRACT | Status: DC | PRN

## 2015-11-28 MED ORDER — CETYLPYRIDINIUM CHLORIDE 0.05 % MT LIQD
7.0000 mL | Freq: Two times a day (BID) | OROMUCOSAL | Status: DC
  Administered 2015-11-28 – 2015-12-03 (×7): 7 mL via OROMUCOSAL

## 2015-11-28 MED ORDER — LISINOPRIL 5 MG PO TABS
5.0000 mg | ORAL_TABLET | Freq: Every day | ORAL | Status: DC
  Administered 2015-11-28 – 2015-12-03 (×6): 5 mg via ORAL
  Filled 2015-11-28 (×2): qty 2
  Filled 2015-11-28 (×2): qty 1
  Filled 2015-11-28 (×3): qty 2

## 2015-11-28 NOTE — Progress Notes (Signed)
  Echocardiogram 2D Echocardiogram has been performed.  Katherine Terrell 11/28/2015, 9:52 AM

## 2015-11-28 NOTE — Plan of Care (Signed)
Problem: Tissue Perfusion: Goal: Risk factors for ineffective tissue perfusion will decrease Outcome: Progressing Risk factors have been assessed.  Pt receiving subcutaneous Lovenox injections as prophylaxis.

## 2015-11-28 NOTE — Progress Notes (Signed)
Utilization review completed.  

## 2015-11-28 NOTE — Progress Notes (Addendum)
[facility] PROGRESS NOTE  Katherine Terrell [id number] DOB: 11-08-1960 DOA: 11/27/2015 PCP: No PCP Per Patient  Brief narrative 55 year old female with history of hypertension, hyperlipidemia, multinodular goiter with hypothyroidism, CLL (follows with Dr.Shadad) , cardiomyopathy with EF of 25 and 30% as per 2011 (follow-up echo in 11/2010 showed EF of 55-60% with moderate LVH and grade 1 diastolic dysfunction) presented to the ED with fever and cough for the past 1 week. Patient reported that her coworker was having URI symptoms. For the past week patient was having fevers with chills, body ache, fatigue, headache, nasal congestion and postnasal drip. She went to a clinic in [place], and see on the day of admission and was diagnosed with influenza B and prescribed Tamiflu but she had not filled the prescription since her symptoms worsened and she came to the ED. In the ED patient was septic with tachycardia, tachypnea, hypoxic with O2 sat in high 80s on room air, hypertensive with systolic blood pressure dropped down to 70s and improved with IV fluid, febrile with temperature of 102.31F. Blood work showed chronic leukocytosis secondary to CLL, drop in her hemoglobin to 8.7 from her baseline around 11-12 and thrombocytopenia with platelets of 108. Chemistry showed potassium of 3.3, elevated troponin of 0.14 and normal lactic acid. BNP was elevated to 1800. UA was unremarkable except for hemoglobinuria. Chest x-ray showed left lower lobe infiltrate. Patient admitted to stepdown for sepsis secondary to community-acquired pneumonia and influenza B.  Assessment/Plan: Sepsis secondary to community-acquired pneumonia and influenza B Continue step down monitoring. IV hydration with normal saline. Continue O2 via nasal cannula. Empiric antibiotics with IV Rocephin and azithromycin. Continue antitussives. Nebs as needed. Ordered Tamiflu.  Hypertension On admission improved with IV fluids. Continue  maintenance fluid with normal saline.  NSVT and PVCs Monitor electrolytes closely. Keeping K >4 and MG >2 Patient has elevated troponin peaked at 0.14 but does not have any chest pain symptoms. Possibly associated with acute sepsis. BNP is elevated but no signs of fluid overload. Blood pressure medications held due to low blood pressure. Will resume Coreg once BP stable.  Hypothyroidism Case is normal. Patient informed that her Synthroid was recently increased to 50 g.  Chronic diastolic CHF Monitor strict I/O. Hypovolemic on presentation, received fluid bolus and currently on maintenance. Resumed low-dose Coreg. Holding ACE inhibitor given her low blood pressure. Has elevated BNP but hypovolemic clinically. Check repeat 2-D echo.  Elevated troponin Appears to be demand ischemia in the setting of sepsis. No chest pain symptoms. Has an SVT and PVCs on telemetry. Check 2-D echo.  Anemia Noted for drop in her hemoglobin to 8.7 from a baseline around 10-11. Check stool for occult blood and iron panel.  Thrombocytopenia Possibly due to sepsis. Monitor  CLL Stable. Follows with Dr.Shadad and is on surveillance.  DVT prophylaxis: Subcutaneous Lovenox  Diet: Heart healthy  Code Status: Full code Family Communication: None at bedside Disposition Plan: Continue step down monitoring. Home once improved.   Consultants:  None  Procedures:  None  Antibiotics:  Rocephin and azithromycin since 1/28  HPI/Subjective: Seen and examined. Speaks little [place]. Complains of cough with some epigastric discomfort and congestion. Admission H&P reviewed.  Objective: Filed Vitals:   11/28/15 0900 11/28/15 0953  BP: 110/87   Pulse: 101   Temp:  101.1 F (38.4 C)  Resp: 35     Intake/Output Summary (Last 24 hours) at 11/28/15 1034 Last data filed at 11/28/15 0800  Gross per 24 hour  Intake 3398.16  ml  Output   1125 ml  Net 2273.16 ml   Filed Weights   11/28/15 0700  Weight:  86.2 kg (190 lb 0.6 oz)    Exam:   General:  Middle aged female lying in bed coughing appears fatigued  HEENT: No pallor, moist mucosa, supple neck  Chest: Coarse breath sounds bilaterally  Cardiovascular: Normal S1 and S2, no murmurs or gallop  GI: Soft, nondistended, nontender, bowel sounds present  Musculoskeletal: Warm, no edema  CNS: Alert and oriented  Data Reviewed: Basic Metabolic Panel:  Recent Labs Lab 11/27/15 1452 11/27/15 2216 11/28/15 0351  NA 138 138 138  K 3.8 3.3* 4.3  CL 105 111 110  CO2 21* 23 22  GLUCOSE 133* 120* 123*  BUN 13 10 10   CREATININE 1.14* 0.98 0.93  CALCIUM 8.9 7.3* 7.7*  MG 1.8  --   --   PHOS  --  3.5  --    Liver Function Tests:  Recent Labs Lab 11/27/15 1452 11/28/15 0351  AST 32 29  ALT 16 16  ALKPHOS 102 77  BILITOT 0.8 0.7  PROT 7.8 6.2*  ALBUMIN 4.1 3.2*   No results for input(s): LIPASE, AMYLASE in the last 168 hours. No results for input(s): AMMONIA in the last 168 hours. CBC:  Recent Labs Lab 11/27/15 2216 11/28/15 0351  WBC 68.7* 67.9*  NEUTROABS  --  4.1  HGB 8.7* 9.4*  HCT 28.3* 31.2*  MCV 87.1 87.9  PLT 108* 114*   Cardiac Enzymes:  Recent Labs Lab 11/27/15 2216 11/28/15 0351  TROPONINI 0.14* 0.10*   BNP (last 3 results)  Recent Labs  11/27/15 2216  BNP 1803.4*    ProBNP (last 3 results) No results for input(s): PROBNP in the last 8760 hours.  CBG: No results for input(s): GLUCAP in the last 168 hours.  Recent Results (from the past 240 hour(s))  MRSA PCR Screening     Status: None   Collection Time: 11/27/15  9:34 PM  Result Value Ref Range Status   MRSA by PCR NEGATIVE NEGATIVE Final    Comment:        The GeneXpert MRSA Assay (FDA approved for NASAL specimens only), is one component of a comprehensive MRSA colonization surveillance program. It is not intended to diagnose MRSA infection nor to guide or monitor treatment for MRSA infections.      Studies: Dg  Chest 2 View  11/27/2015  CLINICAL DATA:  Cough and fever for 2 weeks. Multiple hypoxia. Cardiomyopathy and chronic systolic heart failure. Chronic lymphocytic leukemia. EXAM: CHEST  2 VIEW COMPARISON:  11/01/2010 FINDINGS: Mild cardiomegaly and ectasia of the thoracic aorta noted. Asymmetric streaky opacity is seen in the posterior left lower lobe, suspicious for pneumonia. No evidence of pneumothorax or pleural effusion. IMPRESSION: Posterior left lower lobe airspace opacity, suspicious for pneumonia. Followup PA and lateral chest X-ray is recommended in 3-4 weeks following trial of antibiotic therapy to ensure resolution and exclude underlying malignancy. Mild cardiomegaly. Electronically Signed   By: Earle Gell M.D.   On: 11/27/2015 14:40    Scheduled Meds: . antiseptic oral rinse  7 mL Mouth Rinse BID  . azithromycin  500 mg Intravenous Q24H  . carvedilol  6.25 mg Oral BID WC  . cefTRIAXone (ROCEPHIN)  IV  1 g Intravenous Q24H  . enoxaparin (LOVENOX) injection  40 mg Subcutaneous Q24H  . levothyroxine  50 mcg Oral QAC breakfast  . oseltamivir  75 mg Oral BID  . pantoprazole  40 mg Oral Daily   Continuous Infusions: . 0.9 % NaCl with KCl 20 mEq / L 40 mL/hr at 11/27/15 2349      Time spent: 35 minutes    Lorina Duffner, [place]  [facility] Pager [phone]. If 7PM-7AM, please contact night-coverage at [email or web address], password [facility] 11/28/2015, 10:34 AM  LOS: 1 day

## 2015-11-29 ENCOUNTER — Encounter (HOSPITAL_COMMUNITY): Payer: Self-pay | Admitting: Cardiology

## 2015-11-29 DIAGNOSIS — J111 Influenza due to unidentified influenza virus with other respiratory manifestations: Secondary | ICD-10-CM

## 2015-11-29 DIAGNOSIS — J101 Influenza due to other identified influenza virus with other respiratory manifestations: Secondary | ICD-10-CM

## 2015-11-29 DIAGNOSIS — I10 Essential (primary) hypertension: Secondary | ICD-10-CM

## 2015-11-29 DIAGNOSIS — I472 Ventricular tachycardia: Secondary | ICD-10-CM

## 2015-11-29 DIAGNOSIS — I5021 Acute systolic (congestive) heart failure: Secondary | ICD-10-CM

## 2015-11-29 LAB — LEGIONELLA ANTIGEN, URINE

## 2015-11-29 LAB — CBC WITH DIFFERENTIAL/PLATELET
BASOS ABS: 0 10*3/uL (ref 0.0–0.1)
Basophils Relative: 0 %
EOS ABS: 0 10*3/uL (ref 0.0–0.7)
Eosinophils Relative: 0 %
HCT: 29.2 % — ABNORMAL LOW (ref 36.0–46.0)
HEMOGLOBIN: 8.8 g/dL — AB (ref 12.0–15.0)
LYMPHS ABS: 64.3 10*3/uL — AB (ref 0.7–4.0)
Lymphocytes Relative: 92 %
MCH: 26.7 pg (ref 26.0–34.0)
MCHC: 30.1 g/dL (ref 30.0–36.0)
MCV: 88.5 fL (ref 78.0–100.0)
MONOS PCT: 1 %
Monocytes Absolute: 0.7 10*3/uL (ref 0.1–1.0)
NEUTROS ABS: 4.9 10*3/uL (ref 1.7–7.7)
NEUTROS PCT: 7 %
PLATELETS: 113 10*3/uL — AB (ref 150–400)
RBC: 3.3 MIL/uL — AB (ref 3.87–5.11)
RDW: 15.6 % — ABNORMAL HIGH (ref 11.5–15.5)
WBC: 69.9 10*3/uL — AB (ref 4.0–10.5)

## 2015-11-29 LAB — COMPREHENSIVE METABOLIC PANEL
ALT: 17 U/L (ref 14–54)
ANION GAP: 9 (ref 5–15)
AST: 27 U/L (ref 15–41)
Albumin: 3.2 g/dL — ABNORMAL LOW (ref 3.5–5.0)
Alkaline Phosphatase: 69 U/L (ref 38–126)
BUN: 11 mg/dL (ref 6–20)
CALCIUM: 8.1 mg/dL — AB (ref 8.9–10.3)
CHLORIDE: 103 mmol/L (ref 101–111)
CO2: 26 mmol/L (ref 22–32)
CREATININE: 0.99 mg/dL (ref 0.44–1.00)
Glucose, Bld: 124 mg/dL — ABNORMAL HIGH (ref 65–99)
Potassium: 4.3 mmol/L (ref 3.5–5.1)
Sodium: 138 mmol/L (ref 135–145)
Total Bilirubin: 0.4 mg/dL (ref 0.3–1.2)
Total Protein: 6.4 g/dL — ABNORMAL LOW (ref 6.5–8.1)

## 2015-11-29 LAB — LIPID PANEL
CHOLESTEROL: 157 mg/dL (ref 0–200)
HDL: 30 mg/dL — ABNORMAL LOW (ref >40)
LDL Cholesterol: 104 mg/dL — ABNORMAL HIGH (ref 0–99)
TRIGLYCERIDES: 116 mg/dL (ref <150)
Total CHOL/HDL Ratio: 5.2 RATIO
VLDL: 23 mg/dL (ref 0–40)

## 2015-11-29 LAB — TROPONIN I: Troponin I: 0.09 ng/mL — ABNORMAL HIGH (ref <0.031)

## 2015-11-29 LAB — IRON AND TIBC
IRON: 14 ug/dL — AB (ref 28–170)
Saturation Ratios: 5 % — ABNORMAL LOW (ref 10.4–31.8)
TIBC: 286 ug/dL (ref 250–450)
UIBC: 272 ug/dL

## 2015-11-29 LAB — MAGNESIUM: Magnesium: 1.9 mg/dL (ref 1.7–2.4)

## 2015-11-29 LAB — PATHOLOGIST SMEAR REVIEW

## 2015-11-29 MED ORDER — ALUM & MAG HYDROXIDE-SIMETH 200-200-20 MG/5ML PO SUSP
30.0000 mL | Freq: Four times a day (QID) | ORAL | Status: DC | PRN
  Administered 2015-11-29: 30 mL via ORAL
  Filled 2015-11-29: qty 30

## 2015-11-29 MED ORDER — FUROSEMIDE 10 MG/ML IJ SOLN
40.0000 mg | Freq: Once | INTRAMUSCULAR | Status: AC
  Administered 2015-11-29: 40 mg via INTRAVENOUS
  Filled 2015-11-29: qty 4

## 2015-11-29 NOTE — Progress Notes (Signed)
[facility] PROGRESS NOTE  Katherine Terrell [id number] DOB: 08/21/1961 DOA: 11/27/2015 PCP: No PCP Per Patient  Brief narrative 55 year old female with history of hypertension, hyperlipidemia, multinodular goiter with hypothyroidism, CLL (follows with Dr.Shadad) , cardiomyopathy with EF of 25 and 30% as per 2011 (follow-up echo in 11/2010 showed EF of 55-60% with moderate LVH and grade 1 diastolic dysfunction) presented to the ED with fever and cough for the past 1 week. Patient reported that her coworker was having URI symptoms. For the past week patient was having fevers with chills, body ache, fatigue, headache, nasal congestion and postnasal drip. She went to a clinic in [place], and see on the day of admission and was diagnosed with influenza B and prescribed Tamiflu but she had not filled the prescription since her symptoms worsened and she came to the ED. In the ED patient was septic with tachycardia, tachypnea, hypoxic with O2 sat in high 80s on room air, hypertensive with systolic blood pressure dropped down to 70s and improved with IV fluid, febrile with temperature of 102.86F. Blood work showed chronic leukocytosis secondary to CLL, drop in her hemoglobin to 8.7 from her baseline around 11  and thrombocytopenia with platelets of 108. Chemistry showed potassium of 3.3, elevated troponin of 0.14 and normal lactic acid. BNP was elevated to 1800. UA was unremarkable except for hemoglobinuria. Chest x-ray showed left lower lobe infiltrate. Patient admitted to stepdown for sepsis secondary to community-acquired pneumonia and influenza B.  Assessment/Plan: Sepsis secondary to community-acquired pneumonia and influenza B Continue step down monitoring. Continue O2 via nasal cannula. Empiric antibiotics with IV Rocephin and azithromycin. Continue antitussives. Nebs as needed.  Tamiflu. -Fluids discontinued given cardiomyopathy. Dyspnea better. MAXIMUM TEMPERATURE of 101.42F  Hypotension On  admission improved with IV fluids. Discontinued IV fluids. Placed her on low-dose beta blocker and ACE inhibitor.  NSVT and PVCs Monitor electrolytes closely. Keeping K >4 and MG >2 Patient has elevated troponin peaked at 0.14 but does not have any chest pain symptoms. (Has epigastric pain)  EKG shows PVCs with T-wave inversion in inferior leads. Continue telemetry monitoring.  Acute systolic CHF Patient wheezy 1/29 improved with IV Lasix and nebs. 2-D echo shows EF of 30-35% with diffuse hypokinesis. Could be associated with acute viral cardiomyopathy with sepsis. Had elevated troponin which likely due to demand ischemia. Has epigastric pain. She was last seen by cardiologist Dr Aundra Dubin in [place] and had improved EF on repeat 2-D echo. Placed her on daily IV Lasix, beta blocker and ACE inhibitor. consult cardiology for further recommendations.  -Monitor I's and O's  Epigastric pain Possibly associated with gastritis. Continue PPI. Added Maalox.  Hypothyroidism TSH normal. Patient informed that her Synthroid was recently increased to 50 g.   Anemia Noted for drop in her hemoglobin to 8.7 from a baseline around 10-11. Nurse reported patient past stool yesterday. Hemoccult was negative. Check iron panel.  Thrombocytopenia Possibly due to sepsis. Avoid heparin pronounced.  CLL Stable. Follows with Dr.Shadad and is on surveillance.  DVT prophylaxis: SCDs  Diet: Heart healthy  Code Status: Full code Family Communication: Discussed with daughter and son-in-law at bedside Disposition Plan: For to telemetry if improved during the day.   Consultants:  None  Procedures:  None  Antibiotics:  Rocephin and azithromycin since 1/28  HPI/Subjective: Seen and examined. Still has cough but improved. Shortness of breath better. Has epigastric pain.  Objective: Filed Vitals:   11/29/15 0845 11/29/15 1000  BP: 113/73 114/61  Pulse: 90 74  Temp:  Resp: 27 23     Intake/Output Summary (Last 24 hours) at 11/29/15 1035 Last data filed at 11/29/15 0800  Gross per 24 hour  Intake 1390.42 ml  Output   1575 ml  Net -184.58 ml   Filed Weights   11/28/15 0700 11/29/15 0646  Weight: 86.2 kg (190 lb 0.6 oz) 92.4 kg (203 lb 11.3 oz)    Exam:   General:   appears fatigued, not in distress  HEENT:moist mucosa, supple neck  Chest: Coarse breath sounds bilaterally, no wheezing or crackles  Cardiovascular: Normal S1 and S2, no murmurs or gallop  GI: Soft, nondistended, epigastric tenderness, bowel sounds present  Musculoskeletal: Warm, no edema  CNS: Alert and oriented  Data Reviewed: Basic Metabolic Panel:  Recent Labs Lab 11/27/15 1452 11/27/15 2216 11/28/15 0351 11/28/15 0900 11/29/15 0406  NA 138 138 138  --  138  K 3.8 3.3* 4.3  --  4.3  CL 105 111 110  --  103  CO2 21* 23 22  --  26  GLUCOSE 133* 120* 123*  --  124*  BUN 13 10 10   --  11  CREATININE 1.14* 0.98 0.93  --  0.99  CALCIUM 8.9 7.3* 7.7*  --  8.1*  MG 1.8  --   --  2.1  --   PHOS  --  3.5  --   --   --    Liver Function Tests:  Recent Labs Lab 11/27/15 1452 11/28/15 0351 11/29/15 0406  AST 32 29 27  ALT 16 16 17   ALKPHOS 102 77 69  BILITOT 0.8 0.7 0.4  PROT 7.8 6.2* 6.4*  ALBUMIN 4.1 3.2* 3.2*   No results for input(s): LIPASE, AMYLASE in the last 168 hours. No results for input(s): AMMONIA in the last 168 hours. CBC:  Recent Labs Lab 11/27/15 2216 11/28/15 0351 11/29/15 0406  WBC 68.7* 67.9* 69.9*  NEUTROABS  --  4.1 4.9  HGB 8.7* 9.4* 8.8*  HCT 28.3* 31.2* 29.2*  MCV 87.1 87.9 88.5  PLT 108* 114* 113*   Cardiac Enzymes:  Recent Labs Lab 11/27/15 2216 11/28/15 0351 11/28/15 0959 11/28/15 1611  TROPONINI 0.14* 0.10* 0.09* 0.11*   BNP (last 3 results)  Recent Labs  11/27/15 2216  BNP 1803.4*    ProBNP (last 3 results) No results for input(s): PROBNP in the last 8760 hours.  CBG: No results for input(s): GLUCAP in the  last 168 hours.  Recent Results (from the past 240 hour(s))  Culture, blood (routine x 2)     Status: None (Preliminary result)   Collection Time: 11/27/15  2:38 PM  Result Value Ref Range Status   Specimen Description BLOOD LEFT ANTECUBITAL  Final   Special Requests   Final    BOTTLES DRAWN AEROBIC AND ANAEROBIC 5CC BOTH BOTTLES   Culture   Final    NO GROWTH < 24 HOURS Performed at [facility]    Report Status PENDING  Incomplete  Culture, blood (routine x 2)     Status: None (Preliminary result)   Collection Time: 11/27/15  2:48 PM  Result Value Ref Range Status   Specimen Description BLOOD RIGHT ANTECUBITAL  Final   Special Requests BOTTLES DRAWN AEROBIC AND ANAEROBIC 5 CC EACH  Final   Culture   Final    NO GROWTH < 24 HOURS Performed at [facility]    Report Status PENDING  Incomplete  Urine culture     Status: None   Collection  Time: 11/27/15  4:40 PM  Result Value Ref Range Status   Specimen Description URINE, CLEAN CATCH  Final   Special Requests NONE  Final   Culture   Final    1,000 COLONIES/mL INSIGNIFICANT GROWTH Performed at [facility]    Report Status 11/28/2015 FINAL  Final  MRSA PCR Screening     Status: None   Collection Time: 11/27/15  9:34 PM  Result Value Ref Range Status   MRSA by PCR NEGATIVE NEGATIVE Final    Comment:        The GeneXpert MRSA Assay (FDA approved for NASAL specimens only), is one component of a comprehensive MRSA colonization surveillance program. It is not intended to diagnose MRSA infection nor to guide or monitor treatment for MRSA infections.   Culture, expectorated sputum-assessment     Status: None   Collection Time: 11/28/15  8:00 PM  Result Value Ref Range Status   Specimen Description SPUTUM  Final   Special Requests Normal  Final   Sputum evaluation   Final    THIS SPECIMEN IS ACCEPTABLE. RESPIRATORY CULTURE REPORT TO FOLLOW.   Report Status 11/28/2015 FINAL  Final     Studies: Dg  Chest 2 View  11/27/2015  CLINICAL DATA:  Cough and fever for 2 weeks. Multiple hypoxia. Cardiomyopathy and chronic systolic heart failure. Chronic lymphocytic leukemia. EXAM: CHEST  2 VIEW COMPARISON:  11/01/2010 FINDINGS: Mild cardiomegaly and ectasia of the thoracic aorta noted. Asymmetric streaky opacity is seen in the posterior left lower lobe, suspicious for pneumonia. No evidence of pneumothorax or pleural effusion. IMPRESSION: Posterior left lower lobe airspace opacity, suspicious for pneumonia. Followup PA and lateral chest X-ray is recommended in 3-4 weeks following trial of antibiotic therapy to ensure resolution and exclude underlying malignancy. Mild cardiomegaly. Electronically Signed   By: Earle Gell M.D.   On: 11/27/2015 14:40    Scheduled Meds: . antiseptic oral rinse  7 mL Mouth Rinse BID  . azithromycin  500 mg Intravenous Q24H  . carvedilol  3.125 mg Oral BID WC  . cefTRIAXone (ROCEPHIN)  IV  1 g Intravenous Q24H  . dextromethorphan-guaiFENesin  1 tablet Oral BID  . feeding supplement (ENSURE ENLIVE)  237 mL Oral BID BM  . furosemide  40 mg Oral Daily  . levothyroxine  50 mcg Oral QAC breakfast  . lisinopril  5 mg Oral Daily  . oseltamivir  75 mg Oral BID  . pantoprazole  40 mg Oral Daily   Continuous Infusions:      Time spent: 25 minutes    Trevante Tennell, [place]  [facility] Pager [phone]. If 7PM-7AM, please contact night-coverage at [email or web address], password [facility] 11/29/2015, 10:35 AM  LOS: 2 days

## 2015-11-29 NOTE — Consult Note (Addendum)
Reason for Consult: cardiomyopathy   Referring Physician: Dr. Clementeen Graham   PCP:  No PCP Per Patient  Primary Cardiologist:Dr. Shacarra Choe Katherine Terrell is an 55 y.o. female.    Chief Complaint: pt admitted 11/27/15 with fever and cough.    HPI: 55 year old female admitted 11/27/15 with influenza B and had been placed on Tamiflu by PCP but had not yet started.  CXR with LLL infiltrate and sp02 89-91%.  EKG with ST and PVCs and couplets.  In the ER her BP dropped from 532 systolic to the 99M.  IV fluids given.  Pt admitted.  Now with EF 30-35% diffuse hypokinesis. Mild AS, trivial regurg. LA severely dilated. Mod MR, RA was moderately dilated and moderate TR.  PA pk pressure 56 mm HG- mod to severe pul. HTN.  BNP on admit 1803, troponin 0.14-->0.10-->0.09-->0.11,  WBC 68.7, H/H 8.7/28.3 (in Dec WBC wbc 100) Iron 14 Iron sat 5 Hemoccult neg stool. U/a + RBCs large.  She has hx of cardiomyopathy 08/2010 during hospitalization for "stypical PNA" with EF 25-30% unknown etiology and cardiac cath done 11/2010 with normal coronary arteries and EF 50-55%.  Repeat echo after cath with EF 50-55%.  No recent cardiology follow ups.  She has HTN, CLL-followed by Dr. Alen Blew, hypothyroid.  She was on lisinipril, coreg as outpt and synthroid.  Very little English speaks spanish   She does have chest pain with cough and sore throat.   Past Medical History  Diagnosis Date  . Hyperlipidemia   . Hypertension   . Multinodular goiter     with hypothyroidism  . Cardiomyopathy     echo: (11/11) EF 25-30% with diffuse hypokinesis, mild left atrial enlargement. 11/11 HIV and ANA negative. TSH normal. LHC (1/12): EF 50-55% no angiographic CAD. Echo (2/12): EF 55-60% moderate LV hypertrophy, grade I diastolic dysfunction, mild MR, normal RV  . Chronic lymphocytic leukemia ([name])     followed by Dr. Ralene Ok    Past Surgical History  Procedure Laterality Date  . Cardiac catheterization  2012    @ [facility]    Family History  Problem Relation Age of Onset  . Heart attack Mother 16   Social History:  reports that she has never smoked. She has never used smokeless tobacco. She reports that she does not drink alcohol or use illicit drugs.  Allergies:  Allergies  Allergen Reactions  . Tramadol Nausea And Vomiting  . Other     Antibiotic, patient doesn't know which medication but it caused major fatigue    OUTPATIENT MEDICATIONS: No current facility-administered medications on file prior to encounter.   Current Outpatient Prescriptions on File Prior to Encounter  Medication Sig Dispense Refill  . acetaminophen (TYLENOL) 325 MG tablet Take 650 mg by mouth every 6 (six) hours as needed for moderate pain or headache.     . carvedilol (COREG) 6.25 MG tablet Take 6.25 mg by mouth 2 (two) times daily with a meal.    . lisinopril (PRINIVIL,ZESTRIL) 20 MG tablet Take 20 mg by mouth daily.       CURRENT MEDICATIONS: Scheduled Meds: . antiseptic oral rinse  7 mL Mouth Rinse BID  . azithromycin  500 mg Intravenous Q24H  . carvedilol  3.125 mg Oral BID WC  . cefTRIAXone (ROCEPHIN)  IV  1 g Intravenous Q24H  . dextromethorphan-guaiFENesin  1 tablet Oral BID  . feeding supplement (ENSURE ENLIVE)  237 mL Oral BID BM  .  furosemide  40 mg Oral Daily  . levothyroxine  50 mcg Oral QAC breakfast  . lisinopril  5 mg Oral Daily  . oseltamivir  75 mg Oral BID  . pantoprazole  40 mg Oral Daily   Continuous Infusions:  PRN Meds:.acetaminophen, albuterol, alum & mag hydroxide-simeth, guaiFENesin-dextromethorphan, ondansetron (ZOFRAN) IV   Results for orders placed or performed during the hospital encounter of 11/27/15 (from the past 48 hour(s))  Culture, blood (routine x 2)     Status: None (Preliminary result)   Collection Time: 11/27/15  2:38 PM  Result Value Ref Range   Specimen Description BLOOD LEFT ANTECUBITAL    Special Requests      BOTTLES DRAWN AEROBIC AND ANAEROBIC 5CC BOTH BOTTLES     Culture      NO GROWTH < 24 HOURS Performed at [facility]    Report Status PENDING   Culture, blood (routine x 2)     Status: None (Preliminary result)   Collection Time: 11/27/15  2:48 PM  Result Value Ref Range   Specimen Description BLOOD RIGHT ANTECUBITAL    Special Requests BOTTLES DRAWN AEROBIC AND ANAEROBIC 5 CC EACH    Culture      NO GROWTH < 24 HOURS Performed at [facility]    Report Status PENDING   Comprehensive metabolic panel     Status: Abnormal   Collection Time: 11/27/15  2:52 PM  Result Value Ref Range   Sodium 138 135 - 145 mmol/L   Potassium 3.8 3.5 - 5.1 mmol/L   Chloride 105 101 - 111 mmol/L   CO2 21 (L) 22 - 32 mmol/L   Glucose, Bld 133 (H) 65 - 99 mg/dL   BUN 13 6 - 20 mg/dL   Creatinine, Ser 1.14 (H) 0.44 - 1.00 mg/dL   Calcium 8.9 8.9 - 10.3 mg/dL   Total Protein 7.8 6.5 - 8.1 g/dL   Albumin 4.1 3.5 - 5.0 g/dL   AST 32 15 - 41 U/L   ALT 16 14 - 54 U/L   Alkaline Phosphatase 102 38 - 126 U/L   Total Bilirubin 0.8 0.3 - 1.2 mg/dL   GFR calc non Af Amer 54 (L) >60 mL/min   GFR calc Af Amer >60 >60 mL/min    Comment: (NOTE) The eGFR has been calculated using the CKD EPI equation. This calculation has not been validated in all clinical situations. eGFR's persistently <60 mL/min signify possible Chronic Kidney Disease.    Anion gap 12 5 - 15  Magnesium     Status: None   Collection Time: 11/27/15  2:52 PM  Result Value Ref Range   Magnesium 1.8 1.7 - 2.4 mg/dL  I-Stat CG4 Lactic Acid, ED     Status: None   Collection Time: 11/27/15  3:03 PM  Result Value Ref Range   Lactic Acid, Venous 0.80 0.5 - 2.0 mmol/L  Urinalysis, Routine w reflex microscopic (not at [facility])     Status: Abnormal   Collection Time: 11/27/15  4:40 PM  Result Value Ref Range   Color, Urine YELLOW YELLOW   APPearance CLEAR CLEAR   Specific Gravity, Urine 1.014 1.005 - 1.030   pH 5.0 5.0 - 8.0   Glucose, UA NEGATIVE NEGATIVE mg/dL   Hgb urine dipstick  LARGE (A) NEGATIVE   Bilirubin Urine NEGATIVE NEGATIVE   Ketones, ur NEGATIVE NEGATIVE mg/dL   Protein, ur 100 (A) NEGATIVE mg/dL   Nitrite NEGATIVE NEGATIVE   Leukocytes, UA NEGATIVE NEGATIVE  Urine culture     Status: None   Collection Time: 11/27/15  4:40 PM  Result Value Ref Range   Specimen Description URINE, CLEAN CATCH    Special Requests NONE    Culture      1,000 COLONIES/mL INSIGNIFICANT GROWTH Performed at [facility]    Report Status 11/28/2015 FINAL   Urine microscopic-add on     Status: Abnormal   Collection Time: 11/27/15  4:40 PM  Result Value Ref Range   Squamous Epithelial / LPF 0-5 (A) NONE SEEN   WBC, UA 0-5 0 - 5 WBC/hpf   RBC / HPF 0-5 0 - 5 RBC/hpf   Bacteria, UA NONE SEEN NONE SEEN  I-Stat CG4 Lactic Acid, ED     Status: None   Collection Time: 11/27/15  6:23 PM  Result Value Ref Range   Lactic Acid, Venous 0.52 0.5 - 2.0 mmol/L  Strep pneumoniae urinary antigen     Status: None   Collection Time: 11/27/15  9:23 PM  Result Value Ref Range   Strep Pneumo Urinary Antigen NEGATIVE NEGATIVE    Comment:        Infection due to S. pneumoniae cannot be absolutely ruled out since the antigen present may be below the detection limit of the test. Performed at [facility]   MRSA PCR Screening     Status: None   Collection Time: 11/27/15  9:34 PM  Result Value Ref Range   MRSA by PCR NEGATIVE NEGATIVE    Comment:        The GeneXpert MRSA Assay (FDA approved for NASAL specimens only), is one component of a comprehensive MRSA colonization surveillance program. It is not intended to diagnose MRSA infection nor to guide or monitor treatment for MRSA infections.   CBC     Status: Abnormal   Collection Time: 11/27/15 10:16 PM  Result Value Ref Range   WBC 68.7 (HH) 4.0 - 10.5 K/uL    Comment: CRITICAL RESULT CALLED TO, READ BACK BY AND VERIFIED WITH: [facility] RN [phone] [place],N REPEATED TO VERIFY    RBC 3.25 (L) 3.87 - 5.11  MIL/uL   Hemoglobin 8.7 (L) 12.0 - 15.0 g/dL   HCT 28.3 (L) 36.0 - 46.0 %   MCV 87.1 78.0 - 100.0 fL   MCH 26.8 26.0 - 34.0 pg   MCHC 30.7 30.0 - 36.0 g/dL   RDW 15.3 11.5 - 15.5 %   Platelets 108 (L) 150 - 400 K/uL    Comment: REPEATED TO VERIFY SPECIMEN CHECKED FOR CLOTS PLATELET COUNT CONFIRMED BY SMEAR   HIV antibody     Status: None   Collection Time: 11/27/15 10:16 PM  Result Value Ref Range   HIV Screen 4th Generation wRfx Non Reactive Non Reactive    Comment: (NOTE) Performed At: [facility] [place], [place] [id number] Lindon Romp MD BD:[id number]   TSH     Status: None   Collection Time: 11/27/15 10:16 PM  Result Value Ref Range   TSH 2.087 0.350 - 4.500 uIU/mL  Phosphorus     Status: None   Collection Time: 11/27/15 10:16 PM  Result Value Ref Range   Phosphorus 3.5 2.5 - 4.6 mg/dL  Basic metabolic panel     Status: Abnormal   Collection Time: 11/27/15 10:16 PM  Result Value Ref Range   Sodium 138 135 - 145 mmol/L   Potassium 3.3 (L) 3.5 - 5.1 mmol/L   Chloride 111 101 - 111  mmol/L   CO2 23 22 - 32 mmol/L   Glucose, Bld 120 (H) 65 - 99 mg/dL   BUN 10 6 - 20 mg/dL   Creatinine, Ser 0.98 0.44 - 1.00 mg/dL   Calcium 7.3 (L) 8.9 - 10.3 mg/dL   GFR calc non Af Amer >60 >60 mL/min   GFR calc Af Amer >60 >60 mL/min    Comment: (NOTE) The eGFR has been calculated using the CKD EPI equation. This calculation has not been validated in all clinical situations. eGFR's persistently <60 mL/min signify possible Chronic Kidney Disease.    Anion gap 4 (L) 5 - 15  Brain natriuretic peptide     Status: Abnormal   Collection Time: 11/27/15 10:16 PM  Result Value Ref Range   B Natriuretic Peptide 1803.4 (H) 0.0 - 100.0 pg/mL  Troponin I     Status: Abnormal   Collection Time: 11/27/15 10:16 PM  Result Value Ref Range   Troponin I 0.14 (H) <0.031 ng/mL    Comment:        PERSISTENTLY INCREASED TROPONIN VALUES IN THE RANGE OF 0.04-0.49 ng/mL  CAN BE SEEN IN:       -UNSTABLE ANGINA       -CONGESTIVE HEART FAILURE       -MYOCARDITIS       -CHEST TRAUMA       -ARRYHTHMIAS       -LATE PRESENTING MYOCARDIAL INFARCTION       -COPD   CLINICAL FOLLOW-UP RECOMMENDED.   Comprehensive metabolic panel     Status: Abnormal   Collection Time: 11/28/15  3:51 AM  Result Value Ref Range   Sodium 138 135 - 145 mmol/L   Potassium 4.3 3.5 - 5.1 mmol/L    Comment: DELTA CHECK NOTED REPEATED TO VERIFY NO VISIBLE HEMOLYSIS    Chloride 110 101 - 111 mmol/L   CO2 22 22 - 32 mmol/L   Glucose, Bld 123 (H) 65 - 99 mg/dL   BUN 10 6 - 20 mg/dL   Creatinine, Ser 0.93 0.44 - 1.00 mg/dL   Calcium 7.7 (L) 8.9 - 10.3 mg/dL   Total Protein 6.2 (L) 6.5 - 8.1 g/dL   Albumin 3.2 (L) 3.5 - 5.0 g/dL   AST 29 15 - 41 U/L   ALT 16 14 - 54 U/L   Alkaline Phosphatase 77 38 - 126 U/L   Total Bilirubin 0.7 0.3 - 1.2 mg/dL   GFR calc non Af Amer >60 >60 mL/min   GFR calc Af Amer >60 >60 mL/min    Comment: (NOTE) The eGFR has been calculated using the CKD EPI equation. This calculation has not been validated in all clinical situations. eGFR's persistently <60 mL/min signify possible Chronic Kidney Disease.    Anion gap 6 5 - 15  CBC WITH DIFFERENTIAL     Status: Abnormal   Collection Time: 11/28/15  3:51 AM  Result Value Ref Range   WBC 67.9 (HH) 4.0 - 10.5 K/uL    Comment: CRITICAL VALUE NOTED.  VALUE IS CONSISTENT WITH PREVIOUSLY REPORTED AND CALLED VALUE. REPEATED TO VERIFY    RBC 3.55 (L) 3.87 - 5.11 MIL/uL   Hemoglobin 9.4 (L) 12.0 - 15.0 g/dL   HCT 31.2 (L) 36.0 - 46.0 %   MCV 87.9 78.0 - 100.0 fL   MCH 26.5 26.0 - 34.0 pg   MCHC 30.1 30.0 - 36.0 g/dL   RDW 15.5 11.5 - 15.5 %   Platelets 114 (L) 150 - 400 K/uL  Comment: REPEATED TO VERIFY SPECIMEN CHECKED FOR CLOTS PLATELET COUNT CONFIRMED BY SMEAR    Neutrophils Relative % 6 %   Lymphocytes Relative 94 %   Monocytes Relative 0 %   Eosinophils Relative 0 %   Basophils Relative 0 %     Neutro Abs 4.1 1.7 - 7.7 K/uL   Lymphs Abs 63.8 (H) 0.7 - 4.0 K/uL   Monocytes Absolute 0.0 (L) 0.1 - 1.0 K/uL   Eosinophils Absolute 0.0 0.0 - 0.7 K/uL   Basophils Absolute 0.0 0.0 - 0.1 K/uL   RBC Morphology POLYCHROMASIA PRESENT    WBC Morphology ATYPICAL LYMPHOCYTES     Comment: SMUDGE CELLS  Troponin I     Status: Abnormal   Collection Time: 11/28/15  3:51 AM  Result Value Ref Range   Troponin I 0.10 (H) <0.031 ng/mL    Comment:        PERSISTENTLY INCREASED TROPONIN VALUES IN THE RANGE OF 0.04-0.49 ng/mL CAN BE SEEN IN:       -UNSTABLE ANGINA       -CONGESTIVE HEART FAILURE       -MYOCARDITIS       -CHEST TRAUMA       -ARRYHTHMIAS       -LATE PRESENTING MYOCARDIAL INFARCTION       -COPD   CLINICAL FOLLOW-UP RECOMMENDED.   Magnesium     Status: None   Collection Time: 11/28/15  9:00 AM  Result Value Ref Range   Magnesium 2.1 1.7 - 2.4 mg/dL  Troponin I     Status: Abnormal   Collection Time: 11/28/15  9:59 AM  Result Value Ref Range   Troponin I 0.09 (H) <0.031 ng/mL    Comment:        PERSISTENTLY INCREASED TROPONIN VALUES IN THE RANGE OF 0.04-0.49 ng/mL CAN BE SEEN IN:       -UNSTABLE ANGINA       -CONGESTIVE HEART FAILURE       -MYOCARDITIS       -CHEST TRAUMA       -ARRYHTHMIAS       -LATE PRESENTING MYOCARDIAL INFARCTION       -COPD   CLINICAL FOLLOW-UP RECOMMENDED.   Occult blood card to lab, stool RN will collect     Status: None   Collection Time: 11/28/15 12:00 PM  Result Value Ref Range   Fecal Occult Bld NEGATIVE NEGATIVE  Occult blood card to lab, stool RN will collect     Status: None   Collection Time: 11/28/15  4:00 PM  Result Value Ref Range   Fecal Occult Bld NEGATIVE NEGATIVE  Troponin I     Status: Abnormal   Collection Time: 11/28/15  4:11 PM  Result Value Ref Range   Troponin I 0.11 (H) <0.031 ng/mL    Comment:        PERSISTENTLY INCREASED TROPONIN VALUES IN THE RANGE OF 0.04-0.49 ng/mL CAN BE SEEN IN:       -UNSTABLE  ANGINA       -CONGESTIVE HEART FAILURE       -MYOCARDITIS       -CHEST TRAUMA       -ARRYHTHMIAS       -LATE PRESENTING MYOCARDIAL INFARCTION       -COPD   CLINICAL FOLLOW-UP RECOMMENDED.   Culture, expectorated sputum-assessment     Status: None   Collection Time: 11/28/15  8:00 PM  Result Value Ref Range   Specimen Description SPUTUM    Special Requests Normal  Sputum evaluation      THIS SPECIMEN IS ACCEPTABLE. RESPIRATORY CULTURE REPORT TO FOLLOW.   Report Status 11/28/2015 FINAL   Comprehensive metabolic panel     Status: Abnormal   Collection Time: 11/29/15  4:06 AM  Result Value Ref Range   Sodium 138 135 - 145 mmol/L   Potassium 4.3 3.5 - 5.1 mmol/L   Chloride 103 101 - 111 mmol/L   CO2 26 22 - 32 mmol/L   Glucose, Bld 124 (H) 65 - 99 mg/dL   BUN 11 6 - 20 mg/dL   Creatinine, Ser 0.99 0.44 - 1.00 mg/dL   Calcium 8.1 (L) 8.9 - 10.3 mg/dL   Total Protein 6.4 (L) 6.5 - 8.1 g/dL   Albumin 3.2 (L) 3.5 - 5.0 g/dL   AST 27 15 - 41 U/L   ALT 17 14 - 54 U/L   Alkaline Phosphatase 69 38 - 126 U/L   Total Bilirubin 0.4 0.3 - 1.2 mg/dL   GFR calc non Af Amer >60 >60 mL/min   GFR calc Af Amer >60 >60 mL/min    Comment: (NOTE) The eGFR has been calculated using the CKD EPI equation. This calculation has not been validated in all clinical situations. eGFR's persistently <60 mL/min signify possible Chronic Kidney Disease.    Anion gap 9 5 - 15  CBC WITH DIFFERENTIAL     Status: Abnormal   Collection Time: 11/29/15  4:06 AM  Result Value Ref Range   WBC 69.9 (HH) 4.0 - 10.5 K/uL    Comment: CRITICAL VALUE NOTED.  VALUE IS CONSISTENT WITH PREVIOUSLY REPORTED AND CALLED VALUE.   RBC 3.30 (L) 3.87 - 5.11 MIL/uL   Hemoglobin 8.8 (L) 12.0 - 15.0 g/dL   HCT 29.2 (L) 36.0 - 46.0 %   MCV 88.5 78.0 - 100.0 fL   MCH 26.7 26.0 - 34.0 pg   MCHC 30.1 30.0 - 36.0 g/dL   RDW 15.6 (H) 11.5 - 15.5 %   Platelets 113 (L) 150 - 400 K/uL    Comment: CONSISTENT WITH PREVIOUS RESULT    Neutrophils Relative % 7 %   Lymphocytes Relative 92 %   Monocytes Relative 1 %   Eosinophils Relative 0 %   Basophils Relative 0 %   Neutro Abs 4.9 1.7 - 7.7 K/uL   Lymphs Abs 64.3 (H) 0.7 - 4.0 K/uL   Monocytes Absolute 0.7 0.1 - 1.0 K/uL   Eosinophils Absolute 0.0 0.0 - 0.7 K/uL   Basophils Absolute 0.0 0.0 - 0.1 K/uL   WBC Morphology ATYPICAL LYMPHOCYTES     Comment: SMUDGE CELLS ABSOLUTE LYMPHOCYTOSIS    Smear Review LARGE PLATELETS PRESENT     Lipid Panel     Component Value Date/Time   CHOL 157 11/29/2015 0858   TRIG 116 11/29/2015 0858   HDL 30* 11/29/2015 0858   CHOLHDL 5.2 11/29/2015 0858   VLDL 23 11/29/2015 0858   LDLCALC 104* 11/29/2015 0858    Dg Chest 2 View  11/27/2015  CLINICAL DATA:  Cough and fever for 2 weeks. Multiple hypoxia. Cardiomyopathy and chronic systolic heart failure. Chronic lymphocytic leukemia. EXAM: CHEST  2 VIEW COMPARISON:  11/01/2010 FINDINGS: Mild cardiomegaly and ectasia of the thoracic aorta noted. Asymmetric streaky opacity is seen in the posterior left lower lobe, suspicious for pneumonia. No evidence of pneumothorax or pleural effusion. IMPRESSION: Posterior left lower lobe airspace opacity, suspicious for pneumonia. Followup PA and lateral chest X-ray is recommended in 3-4 weeks following trial of antibiotic therapy  to ensure resolution and exclude underlying malignancy. Mild cardiomegaly. Electronically Signed   By: Earle Gell M.D.   On: 11/27/2015 14:40    ROS: through pt and H&P General:+ colds/flu + fevers, + weight increased from 10/07/15 by 20 lbs. Skin:no rashes or ulcers HEENT:no blurred vision, + congestion CV:see HPI PUL:see HPI GI:no diarrhea constipation or melena, no indigestion GU:no hematuria, no dysuria MS:no joint pain, no claudication Neuro:no syncope, no lightheadedness Endo:no diabetes, + thyroid disease   Blood pressure 114/61, pulse 74, temperature 98.3 F (36.8 C), temperature source Oral, resp. rate 23,  height _0  (1.575 m), weight 203 lb 11.3 oz (92.4 kg), SpO2 98 %.  Wt Readings from Last 3 Encounters:  11/29/15 203 lb 11.3 oz (92.4 kg)  10/07/15 183 lb 14.4 oz (83.416 kg)  06/08/15 193 lb 1.6 oz (87.59 kg)    PE: General:Pleasant affect, NAD Skin:Warm and dry, brisk capillary refill HEENT:normocephalic, sclera clear, mucus membranes moist Neck:supple, no JVD, no bruits  Heart:S1S2 RRR with premature beats without murmur, gallup, rub or click Lungs:diminished post.  without rales, ++ rhonchi, occ wheezes + congested cough NUU:VOZD, non tender, + BS, do not palpate liver spleen or masses Ext:1-2+ lower ext edema, 2+ pedal pulses, 2+ radial pulses Neuro:alert and oriented X 3, MAE, follows commands, + facial symmetry    Assessment/Plan Principal Problem:   CAP (community acquired pneumonia) Active Problems:   Hypothyroidism   CLL (chronic lymphocytic leukemia) (HCC)   Ventricular tachycardia, non-sustained (HCC)   Hyperlipidemia   Essential hypertension   Influenza B   Dehydration   Chronic diastolic heart failure ([place])   Sepsis ([place])  Cardiomyopathy with hx of this in 2011 with PNA presentation but EF improved by Feb 2012 to 50-55% now back to 30-35%.  + 155 yesterday and +2468 since admit. Normal coronary arteries in 2012 ---+ volume overload currently on lasix 40 mg po daily.  May benefit from at least 1 IV dose. MD to see ---on ACE and BB  --- at discharge back to Dr. Aundra Dubin in [place] Troponin--flat numbers and patent coronary arteries in 2012, most likely demand ischemia from hypotension and hypoxia.  No acute EKG changes.   NSVT brief episodes, keep Magnesium > 2 and is to 2.1 and K+ >4.0 they are within parameters but still with freq PVCs and brief bursts of NSVT  Hypothyroid is stable  CLL per IM   Hx G1DD on Echo 2012   Mod MR- new from 2012 when mild and mod TR increased from 2012  [place] Practitioner Certified [place] Pager [phone] or after 5pm or weekends call [phone] 11/29/2015, 10:40 AM  I have examined the patient and reviewed assessment and plan and discussed with patient.  Agree with above as stated.  Likel nonischemic cardiomyopathy.  Will give a dose of IV Lasix to see if this helps the congeted, fullnes she has in her chest.  May be related to the flu as well. Elevated troponin not felt to be due to ACS.  HTN- controlled.     Christion Leonhard S.

## 2015-11-29 NOTE — Progress Notes (Signed)
Nutrition Brief Note  Patient identified on the Malnutrition Screening Tool (MST) Report  Patient with recent weight gain. Patient with flu which has caused her symptoms over the past week. Pt has been ordered Ensure supplements.  Wt Readings from Last 15 Encounters:  11/29/15 203 lb 11.3 oz (92.4 kg)  10/07/15 183 lb 14.4 oz (83.416 kg)  06/08/15 193 lb 1.6 oz (87.59 kg)  03/26/15 194 lb 8 oz (88.225 kg)  07/29/14 192 lb 14.4 oz (87.499 kg)  02/10/14 205 lb 3.2 oz (93.078 kg)  01/27/14 202 lb 1.6 oz (91.672 kg)  07/01/13 193 lb 12.8 oz (87.907 kg)  12/24/12 190 lb 14.4 oz (86.592 kg)  08/27/12 187 lb 1.6 oz (84.868 kg)  04/25/12 191 lb 1.6 oz (86.682 kg)  12/04/11 191 lb 9.6 oz (86.909 kg)  12/23/10 189 lb (85.73 kg)  11/23/10 191 lb (86.637 kg)    Body mass index is 37.25 kg/(m^2). Patient meets criteria for obesity based on current BMI.   Current diet order is Heart Healthy. Labs and medications reviewed.   No nutrition interventions warranted at this time. If nutrition issues arise, please consult RD.   Clayton Bibles, MS, RD, LDN Pager: [phone] After Hours Pager: [phone]

## 2015-11-30 DIAGNOSIS — I5021 Acute systolic (congestive) heart failure: Secondary | ICD-10-CM | POA: Diagnosis present

## 2015-11-30 DIAGNOSIS — J189 Pneumonia, unspecified organism: Secondary | ICD-10-CM

## 2015-11-30 DIAGNOSIS — I429 Cardiomyopathy, unspecified: Secondary | ICD-10-CM

## 2015-11-30 LAB — CBC WITH DIFFERENTIAL/PLATELET
BASOS ABS: 0 10*3/uL (ref 0.0–0.1)
BASOS PCT: 0 %
Blasts: 2 %
EOS PCT: 0 %
Eosinophils Absolute: 0 10*3/uL (ref 0.0–0.7)
HEMATOCRIT: 29.9 % — AB (ref 36.0–46.0)
HEMOGLOBIN: 8.9 g/dL — AB (ref 12.0–15.0)
Lymphocytes Relative: 89 %
Lymphs Abs: 60.1 10*3/uL — ABNORMAL HIGH (ref 0.7–4.0)
MCH: 26.3 pg (ref 26.0–34.0)
MCHC: 29.8 g/dL — ABNORMAL LOW (ref 30.0–36.0)
MCV: 88.5 fL (ref 78.0–100.0)
MONOS PCT: 2 %
Monocytes Absolute: 1.4 10*3/uL — ABNORMAL HIGH (ref 0.1–1.0)
NEUTROS ABS: 4.7 10*3/uL (ref 1.7–7.7)
NEUTROS PCT: 7 %
Platelets: 118 10*3/uL — ABNORMAL LOW (ref 150–400)
RBC: 3.38 MIL/uL — AB (ref 3.87–5.11)
RDW: 15.3 % (ref 11.5–15.5)
WBC: 67.5 10*3/uL — AB (ref 4.0–10.5)

## 2015-11-30 LAB — BASIC METABOLIC PANEL
ANION GAP: 11 (ref 5–15)
BUN: 12 mg/dL (ref 6–20)
CO2: 30 mmol/L (ref 22–32)
Calcium: 8.5 mg/dL — ABNORMAL LOW (ref 8.9–10.3)
Chloride: 98 mmol/L — ABNORMAL LOW (ref 101–111)
Creatinine, Ser: 1.24 mg/dL — ABNORMAL HIGH (ref 0.44–1.00)
GFR calc Af Amer: 56 mL/min — ABNORMAL LOW (ref >60)
GFR, EST NON AFRICAN AMERICAN: 49 mL/min — AB (ref >60)
Glucose, Bld: 138 mg/dL — ABNORMAL HIGH (ref 65–99)
POTASSIUM: 3.5 mmol/L (ref 3.5–5.1)
SODIUM: 139 mmol/L (ref 135–145)

## 2015-11-30 LAB — LIPASE, BLOOD: LIPASE: 34 U/L (ref 11–51)

## 2015-11-30 LAB — MAGNESIUM: MAGNESIUM: 1.8 mg/dL (ref 1.7–2.4)

## 2015-11-30 MED ORDER — HYDROCOD POLST-CPM POLST ER 10-8 MG/5ML PO SUER
5.0000 mL | Freq: Once | ORAL | Status: AC
  Administered 2015-11-30: 5 mL via ORAL
  Filled 2015-11-30: qty 5

## 2015-11-30 MED ORDER — MAGNESIUM SULFATE 2 GM/50ML IV SOLN
2.0000 g | Freq: Once | INTRAVENOUS | Status: AC
  Administered 2015-11-30: 2 g via INTRAVENOUS
  Filled 2015-11-30: qty 50

## 2015-11-30 MED ORDER — ALBUTEROL SULFATE (2.5 MG/3ML) 0.083% IN NEBU
2.5000 mg | INHALATION_SOLUTION | Freq: Four times a day (QID) | RESPIRATORY_TRACT | Status: DC
  Administered 2015-11-30 – 2015-12-01 (×4): 2.5 mg via RESPIRATORY_TRACT
  Filled 2015-11-30 (×4): qty 3

## 2015-11-30 MED ORDER — POTASSIUM CHLORIDE CRYS ER 20 MEQ PO TBCR
40.0000 meq | EXTENDED_RELEASE_TABLET | Freq: Once | ORAL | Status: AC
  Administered 2015-11-30: 40 meq via ORAL
  Filled 2015-11-30: qty 2

## 2015-11-30 NOTE — Progress Notes (Signed)
[facility] PROGRESS NOTE  Shelia Crumbaker [id number] DOB: Jan 27, 1961 DOA: 11/27/2015 PCP: No PCP Per Patient  Brief narrative 55 year old female with history of hypertension, hyperlipidemia, multinodular goiter with hypothyroidism, CLL (follows with Dr.Shadad) , cardiomyopathy with EF of 25 and 30% as per 2011 (follow-up echo in 11/2010 showed EF of 55-60% with moderate LVH and grade 1 diastolic dysfunction) presented to the ED with fever and cough for the past 1 week. Patient reported that her coworker was having URI symptoms. For the past week patient was having fevers with chills, body ache, fatigue, headache, nasal congestion and postnasal drip. She went to a clinic in [place], and see on the day of admission and was diagnosed with influenza B and prescribed Tamiflu but she had not filled the prescription since her symptoms worsened and she came to the ED. In the ED patient was septic with tachycardia, tachypnea, hypoxic with O2 sat in high 80s on room air, hypertensive with systolic blood pressure dropped down to 70s and improved with IV fluid, febrile with temperature of 102.35F. Blood work showed chronic leukocytosis secondary to CLL, drop in her hemoglobin to 8.7 from her baseline around 11  and thrombocytopenia with platelets of 108. Chemistry showed potassium of 3.3, elevated troponin of 0.14 and normal lactic acid. BNP was elevated to 1800. UA was unremarkable except for hemoglobinuria. Chest x-ray showed left lower lobe infiltrate. Patient admitted to stepdown for sepsis secondary to community-acquired pneumonia and influenza B.  Assessment/Plan: Sepsis secondary to community-acquired pneumonia and influenza B Continue step down monitoring.  Empiric antibiotics with IV Rocephin and azithromycin. Continue antitussives. Nebs as needed.  Tamiflu. -Shortness of breath improved and maintaining O2 sat on room air.  Placed her on low-dose beta blocker and ACE inhibitor.  NSVT and  PVCs Patient has elevated troponin peaked at 0.14 but does not have any chest pain symptoms. (Has epigastric pain)  EKG shows PVCs with T-wave inversion in inferior leads. Continue telemetry monitoring. -Replenish low potassium and magnesium.   Acute systolic CHF  2-D echo shows EF of 30-35% with diffuse hypokinesis. Could be associated with acute viral cardiomyopathy with sepsis. Had elevated troponin which likely due to demand ischemia.  She was last seen by cardiologist Dr Aundra Dubin in [place] and had improved EF on repeat 2-D echo continue daily IV Lasix and has good urinary output. Added beta blocker and ACE inhibitor.  Appreciate cardiology evaluation. 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 Continue current management with plan on outpatient follow-up with Dr. Aundra Dubin the heart failure clinic upon discharge.  LDL of 104. Add statin upon discharge.   Epigastric pain Possibly associated with gastritis. Continue PPI and Maalox. Check lipase.   Hypothyroidism TSH normal. Patient informed that her Synthroid was recently increased to 50 g.  Iron deficiency anemia.  Hemoglobin dropped to 8.7 from a baseline of around 10-11. Denies passing.stools or bright red blood per rectum. Supplement upon discharge. Needs outpatient GI referral.   Thrombocytopenia Possibly due to sepsis. Avoid heparin pronounced.  CLL Stable. Follows with Dr.Shadad and is on surveillance.  DVT prophylaxis: SCDs  Diet: Heart healthy  Code Status: Full code Family Communication: Discussed with son at bedside Disposition Plan: Transfer to telemetry if stable during the day.    Consultants:  None  Procedures: 2-D echo  antibiotics:  Rocephin and azithromycin since 1/28   Tamiflu since 1/28  HPI/Subjective: Seen and examined.reports having cough and throat congestion. Dyspnea better. Still has some epigastric pain Objective: Filed Vitals:   11/30/15  1000 11/30/15 1100  BP: 106/35  115/68  Pulse: 77 117  Temp:    Resp: 22 17    Intake/Output Summary (Last 24 hours) at 11/30/15 1134 Last data filed at 11/30/15 1100  Gross per 24 hour  Intake    590 ml  Output   3500 ml  Net  -2910 ml   Filed Weights   11/28/15 0700 11/29/15 0646  Weight: 86.2 kg (190 lb 0.6 oz) 92.4 kg (203 lb 11.3 oz)    Exam:   General:   appears fatigued, not in distress  HEENT:moist mucosa, supple neck  Chest: Coarse breath sounds bilaterally (improved from yesterday) , no wheezing or crackles  Cardiovascular: Normal S1 and S2, no murmurs or gallop  GI: Soft, nondistended,  mild epigastric tenderness, bowel sounds present  Musculoskeletal: Warm, no edema  CNS: Alert and oriented  Data Reviewed: Basic Metabolic Panel:  Recent Labs Lab 11/27/15 1452 11/27/15 2216 11/28/15 0351 11/28/15 0900 11/29/15 0406 11/29/15 0858 11/30/15 0340  NA 138 138 138  --  138  --  139  K 3.8 3.3* 4.3  --  4.3  --  3.5  CL 105 111 110  --  103  --  98*  CO2 21* 23 22  --  26  --  30  GLUCOSE 133* 120* 123*  --  124*  --  138*  BUN 13 10 10   --  11  --  12  CREATININE 1.14* 0.98 0.93  --  0.99  --  1.24*  CALCIUM 8.9 7.3* 7.7*  --  8.1*  --  8.5*  MG 1.8  --   --  2.1  --  1.9 1.8  PHOS  --  3.5  --   --   --   --   --    Liver Function Tests:  Recent Labs Lab 11/27/15 1452 11/28/15 0351 11/29/15 0406  AST 32 29 27  ALT 16 16 17   ALKPHOS 102 77 69  BILITOT 0.8 0.7 0.4  PROT 7.8 6.2* 6.4*  ALBUMIN 4.1 3.2* 3.2*   No results for input(s): LIPASE, AMYLASE in the last 168 hours. No results for input(s): AMMONIA in the last 168 hours. CBC:  Recent Labs Lab 11/27/15 2216 11/28/15 0351 11/29/15 0406 11/30/15 0345  WBC 68.7* 67.9* 69.9* 67.5*  NEUTROABS  --  4.1 4.9 4.7  HGB 8.7* 9.4* 8.8* 8.9*  HCT 28.3* 31.2* 29.2* 29.9*  MCV 87.1 87.9 88.5 88.5  PLT 108* 114* 113* 118*   Cardiac Enzymes:  Recent Labs Lab 11/27/15 2216 11/28/15 0351 11/28/15 0959  11/28/15 1611 11/29/15 0858  TROPONINI 0.14* 0.10* 0.09* 0.11* 0.09*   BNP (last 3 results)  Recent Labs  11/27/15 2216  BNP 1803.4*    ProBNP (last 3 results) No results for input(s): PROBNP in the last 8760 hours.  CBG: No results for input(s): GLUCAP in the last 168 hours.  Recent Results (from the past 240 hour(s))  Culture, blood (routine x 2)     Status: None (Preliminary result)   Collection Time: 11/27/15  2:38 PM  Result Value Ref Range Status   Specimen Description BLOOD LEFT ANTECUBITAL  Final   Special Requests   Final    BOTTLES DRAWN AEROBIC AND ANAEROBIC 5CC BOTH BOTTLES   Culture   Final    NO GROWTH 2 DAYS Performed at [facility]    Report Status PENDING  Incomplete  Culture, blood (routine x 2)  Status: None (Preliminary result)   Collection Time: 11/27/15  2:48 PM  Result Value Ref Range Status   Specimen Description BLOOD RIGHT ANTECUBITAL  Final   Special Requests BOTTLES DRAWN AEROBIC AND ANAEROBIC 5 CC EACH  Final   Culture   Final    NO GROWTH 2 DAYS Performed at [facility]    Report Status PENDING  Incomplete  Urine culture     Status: None   Collection Time: 11/27/15  4:40 PM  Result Value Ref Range Status   Specimen Description URINE, CLEAN CATCH  Final   Special Requests NONE  Final   Culture   Final    1,000 COLONIES/mL INSIGNIFICANT GROWTH Performed at [facility]    Report Status 11/28/2015 FINAL  Final  MRSA PCR Screening     Status: None   Collection Time: 11/27/15  9:34 PM  Result Value Ref Range Status   MRSA by PCR NEGATIVE NEGATIVE Final    Comment:        The GeneXpert MRSA Assay (FDA approved for NASAL specimens only), is one component of a comprehensive MRSA colonization surveillance program. It is not intended to diagnose MRSA infection nor to guide or monitor treatment for MRSA infections.   Culture, expectorated sputum-assessment     Status: None   Collection Time: 11/28/15  8:00  PM  Result Value Ref Range Status   Specimen Description SPUTUM  Final   Special Requests Normal  Final   Sputum evaluation   Final    THIS SPECIMEN IS ACCEPTABLE. RESPIRATORY CULTURE REPORT TO FOLLOW.   Report Status 11/28/2015 FINAL  Final  Culture, respiratory (NON-Expectorated)     Status: None (Preliminary result)   Collection Time: 11/28/15  8:00 PM  Result Value Ref Range Status   Specimen Description SPU  Final   Special Requests NONE  Final   Gram Stain   Final    NO WBC SEEN NO SQUAMOUS EPITHELIAL CELLS SEEN NO ORGANISMS SEEN Performed at [facility]    Culture   Final    Culture reincubated for better growth Performed at [facility]    Report Status PENDING  Incomplete     Studies: No results found.  Scheduled Meds: . albuterol  2.5 mg Nebulization Q6H  . antiseptic oral rinse  7 mL Mouth Rinse BID  . azithromycin  500 mg Intravenous Q24H  . carvedilol  3.125 mg Oral BID WC  . cefTRIAXone (ROCEPHIN)  IV  1 g Intravenous Q24H  . dextromethorphan-guaiFENesin  1 tablet Oral BID  . feeding supplement (ENSURE ENLIVE)  237 mL Oral BID BM  . furosemide  40 mg Oral Daily  . levothyroxine  50 mcg Oral QAC breakfast  . lisinopril  5 mg Oral Daily  . oseltamivir  75 mg Oral BID  . pantoprazole  40 mg Oral Daily   Continuous Infusions:      Time spent: 25 minutes    Season Astacio, [place]  [facility] Pager [phone]. If 7PM-7AM, please contact night-coverage at [email or web address], password [email or web address] 11/30/2015, 11:34 AM  LOS: 3 days

## 2015-11-30 NOTE — Progress Notes (Signed)
Pt profile:  55 year old female admitted 11/27/15 with influenza B and had been placed on Tamiflu by PCP but had not yet started. CXR with LLL infiltrate and sp02 89-91%. EKG with ST and PVCs and couplets. In the ER her BP dropped from [id number] systolic to the [id number]. IV fluids given. Pt admitted. Now with EF 30-35% diffuse hypokinesis. Mild AS, trivial regurg. LA severely dilated. Mod MR, RA was moderately dilated and moderate TR. PA pk pressure 56 mm HG- mod to severe pul. HTN.  Hx of cardiomyopathy 08/2010 during hospitalization for "stypical PNA" with EF 25-30% unknown etiology and cardiac cath done 11/2010 with normal coronary arteries and EF 50-55%. Repeat echo after cath with EF 50-55%   Subjective: Pain in throat and chest with cough  Objective: Vital signs in last 24 hours: Temp:  [98.5 F (36.9 C)-99.7 F (37.6 C)] 98.5 F (36.9 C) (01/31 0400) Pulse Rate:  [74-104] 82 (01/31 0400) Resp:  [17-36] 22 (01/31 0400) BP: (94-139)/(44-78) 94/44 mmHg (01/31 0400) SpO2:  [82 %-100 %] 84 % (01/31 0400) Weight change:  Last BM Date: 11/28/15 Intake/Output from previous day: -2960 01/30 0701 - 01/31 0700 In: 540 [P.O.:240; IV Piggyback:300] Out: 3500 [Urine:3500] Intake/Output this shift:    PE: General:Pleasant affect, NAD Skin:Warm and dry, brisk capillary refill HEENT:normocephalic, sclera clear, mucus membranes moist Heart:S1S2 RRR without murmur, gallup, rub or click Lungs:r without rales, occ rhonchi, no wheezes [id number], non tender, + BS, do not palpate liver spleen or masses Ext:no lower ext edema, 2+ pedal pulses, 2+ radial pulses Neuro:alert and oriented X 3, MAE, follows commands, + facial symmetry Tele: SR with NSVT    Lab Results:  Recent Labs  11/29/15 0406 11/30/15 0345  WBC 69.9* 67.5*  HGB 8.8* 8.9*  HCT 29.2* 29.9*  PLT 113* 118*   BMET  Recent Labs  11/29/15 0406 11/30/15 0340  NA 138 139  K 4.3 3.5  CL 103 98*  CO2 26 30  GLUCOSE  124* 138*  BUN 11 12  CREATININE 0.99 1.24*  CALCIUM 8.1* 8.5*    Recent Labs  11/28/15 1611 11/29/15 0858  TROPONINI 0.11* 0.09*    Lab Results  Component Value Date   CHOL 157 11/29/2015   HDL 30* 11/29/2015   LDLCALC 104* 11/29/2015   TRIG 116 11/29/2015   CHOLHDL 5.2 11/29/2015   No results found for: HGBA1C   Lab Results  Component Value Date   TSH 2.087 11/27/2015    Hepatic Function Panel  Recent Labs  11/29/15 0406  PROT 6.4*  ALBUMIN 3.2*  AST 27  ALT 17  ALKPHOS 69  BILITOT 0.4    Recent Labs  11/29/15 0858  CHOL 157   No results for input(s): PROTIME in the last 72 hours.     Studies/Results: No results found.  Medications: I have reviewed the patient's current medications. Scheduled Meds: . antiseptic oral rinse  7 mL Mouth Rinse BID  . azithromycin  500 mg Intravenous Q24H  . carvedilol  3.125 mg Oral BID WC  . cefTRIAXone (ROCEPHIN)  IV  1 g Intravenous Q24H  . dextromethorphan-guaiFENesin  1 tablet Oral BID  . feeding supplement (ENSURE ENLIVE)  237 mL Oral BID BM  . furosemide  40 mg Oral Daily  . levothyroxine  50 mcg Oral QAC breakfast  . lisinopril  5 mg Oral Daily  . oseltamivir  75 mg Oral BID  . pantoprazole  40 mg Oral Daily  Continuous Infusions:  PRN Meds:.acetaminophen, albuterol, alum & mag hydroxide-simeth, guaiFENesin-dextromethorphan, ondansetron (ZOFRAN) IV  Assessment/Plan: Principal Problem:   CAP (community acquired pneumonia) Active Problems:   Hypothyroidism   CLL (chronic lymphocytic leukemia) (HCC)   Ventricular tachycardia, non-sustained (HCC)   Hyperlipidemia   Essential hypertension   Influenza B   Dehydration   Chronic diastolic heart failure ([place])   Sepsis ([place])  Cardiomyopathy -likely  Non ischemic with hx of this in 2011 with PNA presentation but EF improved by Feb 2012 to 50-55% now back to 30-35%. + 155 yesterday and +2468 since admit-- today after neg. 2960 last pm now -491  ---  normal coronary arteries in 2012 ---+ volume overload improved after one dose IV-currently on lasix 40 mg po daily.  ---on ACE and BB ? Add spironolactone --- at discharge back to Dr. Aundra Dubin in [place] Troponin--flat numbers and patent coronary arteries in 2012, most likely demand ischemia from hypotension and hypoxia. No acute EKG changes.   NSVT brief episodes, keep Magnesium > 2 and is to 1.9 and K+ >4.0 now 3.5 still with freq PVCs and brief bursts of NSVT--once BP improves increase BB  Hypothyroid is stable  CLL per IM WBC stable at 46  Hx G1DD on Echo 2012   HTN controlled at times to 94/44  Flu/PNA improving   LOS: 3 days   Time spent with pt. :15 minutes. [facility] R  Nurse Practitioner Certified Pager [phone] or after 5pm and on weekends call [phone] 11/30/2015, 8:00 AM   I have examined the patient and reviewed assessment and plan and discussed with patient.  Agree with above as stated.  She feels better.  Wants to go home ASAP.  Congestion in her chest better today.  She received IV lasix yesterday.  Diuresed well.  Will need diuretics while EF is low.   VARANASI,JAYADEEP S.

## 2015-12-01 DIAGNOSIS — I5032 Chronic diastolic (congestive) heart failure: Secondary | ICD-10-CM

## 2015-12-01 DIAGNOSIS — C9110 Chronic lymphocytic leukemia of B-cell type not having achieved remission: Secondary | ICD-10-CM

## 2015-12-01 LAB — CBC WITH DIFFERENTIAL/PLATELET
BASOS ABS: 0 10*3/uL (ref 0.0–0.1)
BASOS PCT: 0 %
Eosinophils Absolute: 1.3 10*3/uL — ABNORMAL HIGH (ref 0.0–0.7)
Eosinophils Relative: 2 %
HEMATOCRIT: 29.9 % — AB (ref 36.0–46.0)
Hemoglobin: 9 g/dL — ABNORMAL LOW (ref 12.0–15.0)
LYMPHS ABS: 56.6 10*3/uL — AB (ref 0.7–4.0)
Lymphocytes Relative: 89 %
MCH: 26.5 pg (ref 26.0–34.0)
MCHC: 30.1 g/dL (ref 30.0–36.0)
MCV: 87.9 fL (ref 78.0–100.0)
MONOS PCT: 2 %
Monocytes Absolute: 1.3 10*3/uL — ABNORMAL HIGH (ref 0.1–1.0)
NEUTROS ABS: 4.5 10*3/uL (ref 1.7–7.7)
Neutrophils Relative %: 7 %
Platelets: 114 10*3/uL — ABNORMAL LOW (ref 150–400)
RBC: 3.4 MIL/uL — ABNORMAL LOW (ref 3.87–5.11)
RDW: 15.2 % (ref 11.5–15.5)
WBC: 63.7 10*3/uL (ref 4.0–10.5)

## 2015-12-01 LAB — BASIC METABOLIC PANEL
Anion gap: 8 (ref 5–15)
BUN: 11 mg/dL (ref 6–20)
CO2: 32 mmol/L (ref 22–32)
Calcium: 8.4 mg/dL — ABNORMAL LOW (ref 8.9–10.3)
Chloride: 98 mmol/L — ABNORMAL LOW (ref 101–111)
Creatinine, Ser: 1.05 mg/dL — ABNORMAL HIGH (ref 0.44–1.00)
GFR, EST NON AFRICAN AMERICAN: 60 mL/min — AB (ref >60)
GLUCOSE: 117 mg/dL — AB (ref 65–99)
Potassium: 3.7 mmol/L (ref 3.5–5.1)
Sodium: 138 mmol/L (ref 135–145)

## 2015-12-01 LAB — MAGNESIUM: MAGNESIUM: 1.9 mg/dL (ref 1.7–2.4)

## 2015-12-01 LAB — CULTURE, RESPIRATORY
CULTURE: NORMAL
GRAM STAIN: NONE SEEN

## 2015-12-01 LAB — CULTURE, RESPIRATORY W GRAM STAIN

## 2015-12-01 MED ORDER — ALBUTEROL SULFATE (2.5 MG/3ML) 0.083% IN NEBU: 2.5000 mg | INHALATION_SOLUTION | RESPIRATORY_TRACT | Status: DC | PRN

## 2015-12-01 MED ORDER — ALBUTEROL SULFATE (2.5 MG/3ML) 0.083% IN NEBU
2.5000 mg | INHALATION_SOLUTION | Freq: Three times a day (TID) | RESPIRATORY_TRACT | Status: DC
  Administered 2015-12-01 – 2015-12-03 (×7): 2.5 mg via RESPIRATORY_TRACT
  Filled 2015-12-01 (×7): qty 3

## 2015-12-01 NOTE — Progress Notes (Signed)
Patient A/Ox4. Online interpreter services were used during the shift to assess patient's condition. Pt.received Tussionex po once for c/o strong cough unrelieved by prn Guaifensin.  She an episode of ventricular bigeminy while sitting in chair. Pt.had c/o dizziness upon standing but denied SOB and CP pain when interpreted. Paged and notified Mid-level provider on call with [facility].

## 2015-12-01 NOTE — Progress Notes (Signed)
Date: December 01, 2015 Chart reviewed for concurrent status and case management needs. Will continue to follow patient for changes and needs: hypovolemic with hypotension and pna Velva Harman, BSN, [place], [place]   [phone]

## 2015-12-01 NOTE — Progress Notes (Addendum)
Patient ID: Katherine Terrell, female   DOB: November 22, 1960, 55 y.o.   MRN: [id number]  [facility] PROGRESS NOTE  Katherine Terrell [id number] DOB: Sep 18, 1961 DOA: 11/27/2015 PCP: No PCP Per Patient   Brief narrative:    55 year old female with history of hypertension, hyperlipidemia, multinodular goiter with hypothyroidism, CLL (follows with Dr.Shadad) , cardiomyopathy with EF of 25 and 30% as per 2011 (follow-up echo in 11/2010 showed EF of 55-60% with moderate LVH and grade 1 diastolic dysfunction) presented to the ED with fever and cough for the past 1 week. Patient reported that her coworker was having URI symptoms. For the past week patient was having fevers with chills, body aches, fatigue, headache, nasal congestion and postnasal drip.  She went to a clinic in [place], and see on the day of admission and was diagnosed with influenza B and prescribed Tamiflu but she had not filled the prescription since her symptoms worsened and she came to the ED.  In the ED patient was septic with tachycardia, tachypnea, hypoxic with O2 sat in high 80s on room air, hypotensive with systolic blood pressure down to 70s and improved with IV fluid, febrile with temperature of 102.42F.  Blood work showed chronic leukocytosis secondary to CLL, drop in her hemoglobin to 8.7 from her baseline around 11 and thrombocytopenia with platelets of 108. Chemistry showed potassium of 3.3, elevated troponin of 0.14 and normal lactic acid. BNP was elevated to 1800. UA was unremarkable except for hemoglobinuria. Chest x-ray showed left lower lobe infiltrate. Patient admitted to stepdown for sepsis secondary to community-acquired pneumonia and influenza B.  Assessment/Plan:    Sepsis secondary to community-acquired pneumonia and influenza B - clinically improving but still very poor oral intake and malaise - encouraged oral intake and hydration, ambulation as pt able to tolerate  - continue Zithromax and Rocephin,  tamiflu - allow antitussives as needed   NSVT and PVCs - EKG shows PVCs with T-wave inversion in inferior leads. Continue telemetry monitoring. - keep electrolytes stable, K ~4 and Mg ~2 - OK to transfer to telemetry bed   Acute systolic CHF - 2-D echo shows EF of 30-35% with diffuse hypokinesis, ? associated with acute viral cardiomyopathy with sepsis. - elevated troponin due to demand ischemia. She was last seen by cardiologist Dr Aundra Dubin in [place] - continue daily PO Lasix, beta blocker and ACE inhibitor.  - plan on outpatient follow-up with Dr. Aundra Dubin the heart failure clinic upon discharge.  - LDL of 104. Add statin upon discharge.  Acute renal injury - resolving - BMP in AM  Epigastric pain - Possibly associated with gastritis - Continue PPI and Maalox  Hypothyroidism - TSH normal.  - Patient informed that her Synthroid was recently increased to 50 g.  Iron deficiency anemia.  - Hg overall stable in the past 24 hours   Thrombocytopenia - reactive, stable in the past 24 hours  - CBC In AM  CLL - Stable. Follows with Dr.Shadad and is on surveillance.  DVT prophylaxis - SCD's  Code Status: Full.  Family Communication:  plan of care discussed with the patient Disposition Plan: Transfer to telemetry   IV access:  Peripheral IV  Procedures and diagnostic studies:    Dg Chest 2 View 11/27/2015 Posterior left lower lobe airspace opacity, suspicious for pneumonia. Followup PA and lateral chest X-ray is recommended in 3-4 weeks following trial of antibiotic therapy to ensure resolution and exclude underlying malignancy. Mild cardiomegaly.   Medical Consultants:  Cardiology   Other Consultants:  None  IAnti-Infectives:     Faye Ramsay, MD  [facility] Pager [phone]  If 7PM-7AM, please contact night-coverage [email or web address] Password [email or web address] 12/01/2015, 10:25 AM   LOS: 4 days   HPI/Subjective: No events overnight.   Objective: Filed Vitals:    12/01/15 0400 12/01/15 0600 12/01/15 0726 12/01/15 0800  BP: 111/79 118/63  125/74  Pulse: 79 73  76  Temp:    97.8 F (36.6 C)  TempSrc:    Oral  Resp: 25 16  25   Height:      Weight:   83.4 kg (183 lb 13.8 oz)   SpO2: 94% 100%  100%    Intake/Output Summary (Last 24 hours) at 12/01/15 1025 Last data filed at 12/01/15 0400  Gross per 24 hour  Intake    420 ml  Output    800 ml  Net   -380 ml    Exam:   General:  Pt is alert, follows commands appropriately, not in acute distress  Cardiovascular: Regular rate and rhythm, no rubs, no gallops  Respiratory: Clear to auscultation bilaterally, rhonchi at bases   Abdomen: Soft, non tender, non distended, bowel sounds present, no guarding  Extremities: No edema, pulses DP and PT palpable bilaterally   Data Reviewed: Basic Metabolic Panel:  Recent Labs Lab 11/27/15 1452 11/27/15 2216 11/28/15 0351 11/28/15 0900 11/29/15 0406 11/29/15 0858 11/30/15 0340 12/01/15 0329  NA 138 138 138  --  138  --  139 138  K 3.8 3.3* 4.3  --  4.3  --  3.5 3.7  CL 105 111 110  --  103  --  98* 98*  CO2 21* 23 22  --  26  --  30 32  GLUCOSE 133* 120* 123*  --  124*  --  138* 117*  BUN 13 10 10   --  11  --  12 11  CREATININE 1.14* 0.98 0.93  --  0.99  --  1.24* 1.05*  CALCIUM 8.9 7.3* 7.7*  --  8.1*  --  8.5* 8.4*  MG 1.8  --   --  2.1  --  1.9 1.8 1.9  PHOS  --  3.5  --   --   --   --   --   --    Liver Function Tests:  Recent Labs Lab 11/27/15 1452 11/28/15 0351 11/29/15 0406  AST 32 29 27  ALT 16 16 17   ALKPHOS 102 77 69  BILITOT 0.8 0.7 0.4  PROT 7.8 6.2* 6.4*  ALBUMIN 4.1 3.2* 3.2*    Recent Labs Lab 11/30/15 0345  LIPASE 34   CBC:  Recent Labs Lab 11/27/15 2216 11/28/15 0351 11/29/15 0406 11/30/15 0345 12/01/15 0329  WBC 68.7* 67.9* 69.9* 67.5* 63.7*  NEUTROABS  --  4.1 4.9 4.7 4.5  HGB 8.7* 9.4* 8.8* 8.9* 9.0*  HCT 28.3* 31.2* 29.2* 29.9* 29.9*  MCV 87.1 87.9 88.5 88.5 87.9  PLT 108* 114* 113* 118*  114*   Cardiac Enzymes:  Recent Labs Lab 11/27/15 2216 11/28/15 0351 11/28/15 0959 11/28/15 1611 11/29/15 0858  TROPONINI 0.14* 0.10* 0.09* 0.11* 0.09*   BNP: Invalid input(s): POCBNP CBG: No results for input(s): GLUCAP in the last 168 hours.  Recent Results (from the past 240 hour(s))  Culture, blood (routine x 2)     Status: None (Preliminary result)   Collection Time: 11/27/15  2:38 PM  Result Value Ref Range Status   Specimen Description BLOOD LEFT ANTECUBITAL  Final   Special Requests  Final    BOTTLES DRAWN AEROBIC AND ANAEROBIC 5CC BOTH BOTTLES   Culture   Final    NO GROWTH 3 DAYS Performed at [facility]    Report Status PENDING  Incomplete  Culture, blood (routine x 2)     Status: None (Preliminary result)   Collection Time: 11/27/15  2:48 PM  Result Value Ref Range Status   Specimen Description BLOOD RIGHT ANTECUBITAL  Final   Special Requests BOTTLES DRAWN AEROBIC AND ANAEROBIC 5 CC EACH  Final   Culture   Final    NO GROWTH 3 DAYS Performed at [facility]    Report Status PENDING  Incomplete  Urine culture     Status: None   Collection Time: 11/27/15  4:40 PM  Result Value Ref Range Status   Specimen Description URINE, CLEAN CATCH  Final   Special Requests NONE  Final   Culture   Final    1,000 COLONIES/mL INSIGNIFICANT GROWTH Performed at [facility]    Report Status 11/28/2015 FINAL  Final  MRSA PCR Screening     Status: None   Collection Time: 11/27/15  9:34 PM  Result Value Ref Range Status   MRSA by PCR NEGATIVE NEGATIVE Final    Comment:        The GeneXpert MRSA Assay (FDA approved for NASAL specimens only), is one component of a comprehensive MRSA colonization surveillance program. It is not intended to diagnose MRSA infection nor to guide or monitor treatment for MRSA infections.   Culture, expectorated sputum-assessment     Status: None   Collection Time: 11/28/15  8:00 PM  Result Value Ref Range  Status   Specimen Description SPUTUM  Final   Special Requests Normal  Final   Sputum evaluation   Final    THIS SPECIMEN IS ACCEPTABLE. RESPIRATORY CULTURE REPORT TO FOLLOW.   Report Status 11/28/2015 FINAL  Final  Culture, respiratory (NON-Expectorated)     Status: None   Collection Time: 11/28/15  8:00 PM  Result Value Ref Range Status   Specimen Description SPU  Final   Special Requests NONE  Final   Gram Stain   Final    NO WBC SEEN NO SQUAMOUS EPITHELIAL CELLS SEEN NO ORGANISMS SEEN Performed at [facility]    Culture   Final    NORMAL OROPHARYNGEAL FLORA Performed at [facility]    Report Status 12/01/2015 FINAL  Final     Scheduled Meds: . albuterol  2.5 mg Nebulization TID  . antiseptic oral rinse  7 mL Mouth Rinse BID  . azithromycin  500 mg Intravenous Q24H  . carvedilol  3.125 mg Oral BID WC  . cefTRIAXone (ROCEPHIN)  IV  1 g Intravenous Q24H  . dextromethorphan-guaiFENesin  1 tablet Oral BID  . feeding supplement (ENSURE ENLIVE)  237 mL Oral BID BM  . furosemide  40 mg Oral Daily  . levothyroxine  50 mcg Oral QAC breakfast  . lisinopril  5 mg Oral Daily  . oseltamivir  75 mg Oral BID  . pantoprazole  40 mg Oral Daily   Continuous Infusions:

## 2015-12-01 NOTE — Progress Notes (Signed)
Pt profile: 55 year old female admitted 11/27/15 with influenza B and had been placed on Tamiflu by PCP but had not yet started. CXR with LLL infiltrate and sp02 89-91%. EKG with ST and PVCs and couplets. In the ER her BP dropped from [id number] systolic to the [id number]. IV fluids given. Pt admitted. Now with EF 30-35% diffuse hypokinesis. Mild AS, trivial regurg. LA severely dilated. Mod MR, RA was moderately dilated and moderate TR. PA pk pressure 56 mm HG- mod to severe pul. HTN. Hx of cardiomyopathy 08/2010 during hospitalization for "stypical PNA" with EF 25-30% unknown etiology and cardiac cath done 11/2010 with normal coronary arteries and EF 50-55%. Repeat echo after cath with EF 50-55%  Subjective: Some throat pain  Objective: Vital signs in last 24 hours: Temp:  [97.6 F (36.4 C)-99.1 F (37.3 C)] 97.8 F (36.6 C) (02/01 0800) Pulse Rate:  [69-117] 76 (02/01 0800) Resp:  [14-30] 25 (02/01 0800) BP: (95-125)/(54-79) 125/74 mmHg (02/01 0800) SpO2:  [77 %-100 %] 100 % (02/01 0800) Weight:  [183 lb 13.8 oz (83.4 kg)] 183 lb 13.8 oz (83.4 kg) (02/01 0726) Weight change:  Last BM Date: 11/29/15 Intake/Output from previous day: -330 01/31 0701 - 02/01 0700 In: 470 [P.O.:120; IV Piggyback:350] Out: 800 [Urine:800] Intake/Output this shift:    PE:  General:Pleasant affect, NAD, does not speak English, overall looks improved. Skin:Warm and dry, brisk capillary refill HEENT:normocephalic, sclera clear, mucus membranes moist Heart:S1S2 RRR without murmur, gallup, rub or click Lungs:without rales,+ rhonchi, occ wheezes but improved air flow [id number], non tender, + BS, do not palpate liver spleen or masses Ext:no lower ext edema, 2+ pedal pulses, 2+ radial pulses Neuro:alert and oriented X 3, MAE, follows commands, + facial symmetry Tele:  SR with freq PVCs  And NSVT 3-4 beats  Lab Results:  Recent Labs  11/30/15 0345 12/01/15 0329  WBC 67.5* 63.7*  HGB 8.9* 9.0*  HCT  29.9* 29.9*  PLT 118* 114*   BMET  Recent Labs  11/30/15 0340 12/01/15 0329  NA 139 138  K 3.5 3.7  CL 98* 98*  CO2 30 32  GLUCOSE 138* 117*  BUN 12 11  CREATININE 1.24* 1.05*  CALCIUM 8.5* 8.4*    Recent Labs  11/28/15 1611 11/29/15 0858  TROPONINI 0.11* 0.09*    Lab Results  Component Value Date   CHOL 157 11/29/2015   HDL 30* 11/29/2015   LDLCALC 104* 11/29/2015   TRIG 116 11/29/2015   CHOLHDL 5.2 11/29/2015   No results found for: HGBA1C   Lab Results  Component Value Date   TSH 2.087 11/27/2015    Hepatic Function Panel  Recent Labs  11/29/15 0406  PROT 6.4*  ALBUMIN 3.2*  AST 27  ALT 17  ALKPHOS 69  BILITOT 0.4    Recent Labs  11/29/15 0858  CHOL 157   No results for input(s): PROTIME in the last 72 hours.     Studies/Results: No results found.  Medications: I have reviewed the patient's current medications. Scheduled Meds: . albuterol  2.5 mg Nebulization TID  . antiseptic oral rinse  7 mL Mouth Rinse BID  . azithromycin  500 mg Intravenous Q24H  . carvedilol  3.125 mg Oral BID WC  . cefTRIAXone (ROCEPHIN)  IV  1 g Intravenous Q24H  . dextromethorphan-guaiFENesin  1 tablet Oral BID  . feeding supplement (ENSURE ENLIVE)  237 mL Oral BID BM  . furosemide  40 mg Oral Daily  .  levothyroxine  50 mcg Oral QAC breakfast  . lisinopril  5 mg Oral Daily  . oseltamivir  75 mg Oral BID  . 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 Aundra Dubin in HF clinic.  EF 30-35%  For 12/07/15  --back on lasix 40 mg po daily will need at discharge - since admit -821  -wt down to 183 from pk of 203.  ELEVATED Troponin--flat numbers and patent coronary arteries in 2012, most likely demand ischemia from hypotension and hypoxia. No acute EKG changes.   NSVT brief episodes, keep Magnesium > 2 and is to 1.9 and K+ >4.0 now 3.5 still with freq PVCs and brief bursts of NSVT--once BP improves increase BB  ? Increase BB today  Hypothyroid is stable  CLL per IM WBC stable at 29  Hx G1DD on Echo 2012   HTN controlled at times to 94/44  Flu/PNA improving  LOS: 4 days   Time spent with pt. :15 minutes. [facility] R  Nurse Practitioner Certified Pager [phone] or after 5pm and on weekends call [phone] 12/01/2015, 10:43 AM   I have examined the patient and reviewed assessment and plan and discussed with patient.  Agree with above as stated.  HR better.  She looks comfortable.  Continue current beta blocker dose.  BP is borderline at times.  WOuld not push dose of coreg at this time.  Stable from a cardiac perspective.   Guinevere Stephenson S.

## 2015-12-02 DIAGNOSIS — R0902 Hypoxemia: Secondary | ICD-10-CM

## 2015-12-02 DIAGNOSIS — I509 Heart failure, unspecified: Secondary | ICD-10-CM

## 2015-12-02 LAB — BASIC METABOLIC PANEL
Anion gap: 9 (ref 5–15)
BUN: 10 mg/dL (ref 6–20)
CALCIUM: 8.4 mg/dL — AB (ref 8.9–10.3)
CHLORIDE: 99 mmol/L — AB (ref 101–111)
CO2: 31 mmol/L (ref 22–32)
CREATININE: 1.02 mg/dL — AB (ref 0.44–1.00)
GFR calc Af Amer: >60 mL/min (ref >60)
GFR calc non Af Amer: >60 mL/min (ref >60)
GLUCOSE: 112 mg/dL — AB (ref 65–99)
Potassium: 4.3 mmol/L (ref 3.5–5.1)
Sodium: 139 mmol/L (ref 135–145)

## 2015-12-02 LAB — CULTURE, BLOOD (ROUTINE X 2)
CULTURE: NO GROWTH
Culture: NO GROWTH

## 2015-12-02 LAB — CBC
HEMATOCRIT: 31.2 % — AB (ref 36.0–46.0)
HEMOGLOBIN: 9.6 g/dL — AB (ref 12.0–15.0)
MCH: 26.1 pg (ref 26.0–34.0)
MCHC: 30.8 g/dL (ref 30.0–36.0)
MCV: 84.8 fL (ref 78.0–100.0)
Platelets: 121 10*3/uL — ABNORMAL LOW (ref 150–400)
RBC: 3.68 MIL/uL — ABNORMAL LOW (ref 3.87–5.11)
RDW: 14.7 % (ref 11.5–15.5)
WBC: 64.9 10*3/uL (ref 4.0–10.5)

## 2015-12-02 LAB — MAGNESIUM: Magnesium: 1.9 mg/dL (ref 1.7–2.4)

## 2015-12-02 MED ORDER — POTASSIUM CHLORIDE CRYS ER 10 MEQ PO TBCR
10.0000 meq | EXTENDED_RELEASE_TABLET | Freq: Every day | ORAL | Status: DC
  Administered 2015-12-02 – 2015-12-03 (×2): 10 meq via ORAL
  Filled 2015-12-02 (×2): qty 1

## 2015-12-02 MED ORDER — MAGNESIUM OXIDE 400 (241.3 MG) MG PO TABS
400.0000 mg | ORAL_TABLET | Freq: Every day | ORAL | Status: DC
  Administered 2015-12-02 – 2015-12-03 (×2): 400 mg via ORAL
  Filled 2015-12-02 (×2): qty 1

## 2015-12-02 NOTE — Evaluation (Signed)
Physical Therapy Evaluation Patient Details Name: Katherine Terrell MRN: [id number] DOB: 15-May-1961 Today's Date: 12/02/2015   History of Present Illness  55 yo female admitted with sepsis, pna, +flu. Pt is spanish-speaking  Clinical Impression  On eval, pt was Min guard-Min assist for mobility-walked ~200 feet. Unsteady. Coughing spells noted during ambulation. Will plan to see another day to practice stair negotiation (pt has 14 steps to get int apt). O2 sats 96% RA during ambulation.     Follow Up Recommendations  (home safety evaluation)    Equipment Recommendations  None recommended by PT    Recommendations for Other Services       Precautions / Restrictions Precautions Precautions: Fall Precaution Comments: droplet Restrictions Weight Bearing Restrictions: No      Mobility  Bed Mobility               General bed mobility comments: sitting EOB  Transfers Overall transfer level: Needs assistance   Transfers: Sit to/from Stand Sit to Stand: Supervision         General transfer comment: for safety  Ambulation/Gait Ambulation/Gait assistance: Min assist Ambulation Distance (Feet): 200 Feet Assistive device: None Gait Pattern/deviations: Step-through pattern;Decreased stride length;Staggering right;Staggering left;Drifts right/left     General Gait Details: Assist to stabilize. O2 sats 96% on RA  Stairs            Wheelchair Mobility    Modified Rankin (Stroke Patients Only)       Balance Overall balance assessment: Needs assistance         Standing balance support: During functional activity Standing balance-Leahy Scale: Fair                               Pertinent Vitals/Pain Pain Assessment: Faces Faces Pain Scale: Hurts little more Pain Location: chest from coughing Pain Intervention(s): Monitored during session    Home Living Family/patient expects to be discharged to:: Private residence Living Arrangements:  Alone   Type of Home: Apartment Home Access: Stairs to enter Entrance Stairs-Rails: Right Entrance Stairs-Number of Steps: 1 flight Home Layout: One level Home Equipment: None      Prior Function Level of Independence: Independent               Hand Dominance        Extremity/Trunk Assessment   Upper Extremity Assessment: Overall WFL for tasks assessed           Lower Extremity Assessment: Generalized weakness      Cervical / Trunk Assessment: Normal  Communication   Communication: No difficulties  Cognition Arousal/Alertness: Awake/alert Behavior During Therapy: WFL for tasks assessed/performed Overall Cognitive Status: Difficult to assess                      General Comments      Exercises        Assessment/Plan    PT Assessment Patient needs continued PT services  PT Diagnosis Difficulty walking;Generalized weakness   PT Problem List Decreased strength;Decreased activity tolerance;Decreased balance;Decreased mobility  PT Treatment Interventions Gait training;Stair training;Functional mobility training;Therapeutic activities;Patient/family education;Therapeutic exercise;Balance training   PT Goals (Current goals can be found in the Care Plan section) Acute Rehab PT Goals Patient Stated Goal: none stated PT Goal Formulation: With patient Time For Goal Achievement: 12/16/15 Potential to Achieve Goals: Good    Frequency Min 3X/week   Barriers to discharge        Co-evaluation  End of Session Equipment Utilized During Treatment: Gait belt Activity Tolerance: Patient limited by fatigue Patient left: in bed;with call bell/phone within reach           Time: [id number] PT Time Calculation (min) (ACUTE ONLY): 15 min   Charges:   PT Evaluation $PT Eval Moderate Complexity: 1 Procedure     PT G Codes:        Weston Anna, MPT Pager: [phone]

## 2015-12-02 NOTE — Progress Notes (Signed)
Patient ID: Katherine Terrell, female   DOB: [birth year], 55 y.o.   MRN: [id number]  [facility] PROGRESS NOTE  Katherine Terrell [id number] DOB: 1961-02-26 DOA: 11/27/2015 PCP: No PCP Per Patient   Brief narrative:    55 year old female with history of hypertension, hyperlipidemia, multinodular goiter with hypothyroidism, CLL (follows with Dr.Shadad) , cardiomyopathy with EF of 25 and 30% as per 2011 (follow-up echo in 11/2010 showed EF of 55-60% with moderate LVH and grade 1 diastolic dysfunction) presented to the ED with fever and cough for the past 1 week. Patient reported that her coworker was having URI symptoms. For the past week patient was having fevers with chills, body aches, fatigue, headache, nasal congestion and postnasal drip.  She went to a clinic in [place], and see on the day of admission and was diagnosed with influenza B and prescribed Tamiflu but she had not filled the prescription since her symptoms worsened and she came to the ED.  In the ED patient was septic with tachycardia, tachypnea, hypoxic with O2 sat in high 80s on room air, hypotensive with systolic blood pressure down to 70s and improved with IV fluid, febrile with temperature of 102.85F.  Blood work showed chronic leukocytosis secondary to CLL, drop in her hemoglobin to 8.7 from her baseline around 11 and thrombocytopenia with platelets of 108. Chemistry showed potassium of 3.3, elevated troponin of 0.14 and normal lactic acid. BNP was elevated to 1800. UA was unremarkable except for hemoglobinuria. Chest x-ray showed left lower lobe infiltrate. Patient admitted to stepdown for sepsis secondary to community-acquired pneumonia and influenza B.  Assessment/Plan:    Sepsis secondary to community-acquired pneumonia and influenza B - clinically improving, eating better, still not ambulating much, PT eval requested  - continue Zithromax and Rocephin - please note that tamiflu therapy completed  - allow  antitussives as needed   NSVT and PVCs - EKG showed PVCs with T-wave inversion in inferior leads. Continue telemetry monitoring. - keep electrolytes stable, K ~4 and Mg ~2  Acute systolic CHF - 2-D echo showed EF of 30-35% with diffuse hypokinesis, ? associated with acute viral cardiomyopathy with sepsis. - elevated troponin due to demand ischemia. She was last seen by cardiologist Dr Aundra Dubin in [place] - continue daily PO Lasix, beta blocker and ACE inhibitor - weight down from 200 lbs to 183 lbs - plan on outpatient follow-up with Dr. Aundra Dubin the heart failure clinic upon discharge.  - LDL of 104. Add statin upon discharge.  Acute renal injury - resolving - BMP in AM  Epigastric pain - Possibly associated with gastritis - Continue PPI and Maalox   Essential HTN - continue Coreg, Lisinopril, Lasix   Hypothyroidism - TSH normal.  - Patient informed that her Synthroid was recently increased to 50 g.  Iron deficiency anemia.  - Hg overall stable in the past 24 hours   Thrombocytopenia - reactive, stable in the past 24 hours  - CBC In AM  CLL - Stable. Follows with Dr.Shadad and is on surveillance.  DVT prophylaxis - SCD's  Code Status: Full.  Family Communication:  plan of care discussed with the patient Disposition Plan: D/C home in AM if cardiology [place] and if pt ambulating OK  IV access:  Peripheral IV  Procedures and diagnostic studies:    Dg Chest 2 View 11/27/2015 Posterior left lower lobe airspace opacity, suspicious for pneumonia. Followup PA and lateral chest X-ray is recommended in 3-4 weeks following trial of antibiotic therapy to ensure resolution and exclude  underlying malignancy. Mild cardiomegaly.   Medical Consultants:  Cardiology   Other Consultants:  None  Katherine Ramsay, MD  [facility] Pager [phone]  If 7PM-7AM, please contact night-coverage [email or web address] Password [email or web address] 12/02/2015, 11:26 AM   LOS: 5 days   HPI/Subjective: No events  overnight.   Objective: Filed Vitals:   12/01/15 2002 12/02/15 0551 12/02/15 0810 12/02/15 0900  BP: 95/60 122/71  117/55  Pulse: 81 85  83  Temp: 98.4 F (36.9 C) 97 F (36.1 C)  98.4 F (36.9 C)  TempSrc: Oral Oral  Oral  Resp: 18 16  20   Height:      Weight:      SpO2: 97% 100% 96% 98%    Intake/Output Summary (Last 24 hours) at 12/02/15 1126 Last data filed at 12/01/15 1722  Gross per 24 hour  Intake    360 ml  Output    300 ml  Net     60 ml    Exam:   General:  Pt is alert, follows commands appropriately, not in acute distress  Cardiovascular: Regular rate and rhythm, no rubs, no gallops  Respiratory: Clear to auscultation bilaterally, rhonchi at bases   Data Reviewed: Basic Metabolic Panel:  Recent Labs Lab 11/27/15 2216 11/28/15 0351 11/28/15 0900 11/29/15 0406 11/29/15 0858 11/30/15 0340 12/01/15 0329 12/02/15 0522  NA 138 138  --  138  --  139 138 139  K 3.3* 4.3  --  4.3  --  3.5 3.7 4.3  CL 111 110  --  103  --  98* 98* 99*  CO2 23 22  --  26  --  30 32 31  GLUCOSE 120* 123*  --  124*  --  138* 117* 112*  BUN 10 10  --  11  --  12 11 10   CREATININE 0.98 0.93  --  0.99  --  1.24* 1.05* 1.02*  CALCIUM 7.3* 7.7*  --  8.1*  --  8.5* 8.4* 8.4*  MG  --   --  2.1  --  1.9 1.8 1.9 1.9  PHOS 3.5  --   --   --   --   --   --   --    Liver Function Tests:  Recent Labs Lab 11/27/15 1452 11/28/15 0351 11/29/15 0406  AST 32 29 27  ALT 16 16 17   ALKPHOS 102 77 69  BILITOT 0.8 0.7 0.4  PROT 7.8 6.2* 6.4*  ALBUMIN 4.1 3.2* 3.2*    Recent Labs Lab 11/30/15 0345  LIPASE 34   CBC:  Recent Labs Lab 11/28/15 0351 11/29/15 0406 11/30/15 0345 12/01/15 0329 12/02/15 0522  WBC 67.9* 69.9* 67.5* 63.7* 64.9*  NEUTROABS 4.1 4.9 4.7 4.5  --   HGB 9.4* 8.8* 8.9* 9.0* 9.6*  HCT 31.2* 29.2* 29.9* 29.9* 31.2*  MCV 87.9 88.5 88.5 87.9 84.8  PLT 114* 113* 118* 114* 121*   Cardiac Enzymes:  Recent Labs Lab 11/27/15 2216 11/28/15 0351  11/28/15 0959 11/28/15 1611 11/29/15 0858  TROPONINI 0.14* 0.10* 0.09* 0.11* 0.09*   Recent Results (from the past 240 hour(s))  Culture, blood (routine x 2)     Status: None (Preliminary result)   Collection Time: 11/27/15  2:38 PM  Result Value Ref Range Status   Specimen Description BLOOD LEFT ANTECUBITAL  Final   Special Requests   Final    BOTTLES DRAWN AEROBIC AND ANAEROBIC 5CC BOTH BOTTLES   Culture   Final    NO GROWTH 4 DAYS  Performed at [facility]    Report Status PENDING  Incomplete  Culture, blood (routine x 2)     Status: None (Preliminary result)   Collection Time: 11/27/15  2:48 PM  Result Value Ref Range Status   Specimen Description BLOOD RIGHT ANTECUBITAL  Final   Special Requests BOTTLES DRAWN AEROBIC AND ANAEROBIC 5 CC EACH  Final   Culture   Final    NO GROWTH 4 DAYS Performed at [facility]    Report Status PENDING  Incomplete  Urine culture     Status: None   Collection Time: 11/27/15  4:40 PM  Result Value Ref Range Status   Specimen Description URINE, CLEAN CATCH  Final   Special Requests NONE  Final   Culture   Final    1,000 COLONIES/mL INSIGNIFICANT GROWTH Performed at [facility]    Report Status 11/28/2015 FINAL  Final  MRSA PCR Screening     Status: None   Collection Time: 11/27/15  9:34 PM  Result Value Ref Range Status   MRSA by PCR NEGATIVE NEGATIVE Final  Culture, expectorated sputum-assessment     Status: None   Collection Time: 11/28/15  8:00 PM  Result Value Ref Range Status   Specimen Description SPUTUM  Final   Special Requests Normal  Final   Sputum evaluation   Final    THIS SPECIMEN IS ACCEPTABLE. RESPIRATORY CULTURE REPORT TO FOLLOW.   Report Status 11/28/2015 FINAL  Final  Culture, respiratory (NON-Expectorated)     Status: None   Collection Time: 11/28/15  8:00 PM  Result Value Ref Range Status   Specimen Description SPU  Final   Special Requests NONE  Final   Gram Stain   Final    NO WBC  SEEN NO SQUAMOUS EPITHELIAL CELLS SEEN NO ORGANISMS SEEN Performed at [facility]    Culture   Final    NORMAL OROPHARYNGEAL FLORA Performed at [facility]    Report Status 12/01/2015 FINAL  Final     Scheduled Meds: . albuterol  2.5 mg Nebulization TID  . azithromycin  500 mg Intravenous Q24H  . carvedilol  3.125 mg Oral BID WC  . cefTRIAXone IV  1 g Intravenous Q24H  . furosemide  40 mg Oral Daily  . levothyroxine  50 mcg Oral QAC breakfast  . lisinopril  5 mg Oral Daily  . magnesium oxide  400 mg Oral Daily  . pantoprazole  40 mg Oral Daily  . potassium chloride  10 mEq Oral Daily   Continuous Infusions:

## 2015-12-02 NOTE — Progress Notes (Signed)
Interpreter ID [id number] used to complete assessment, give medication, and discuss plan of care.

## 2015-12-02 NOTE — Progress Notes (Signed)
Patient had a 10 beat run of Vtach. No complaints from patient. On call made aware.

## 2015-12-02 NOTE — Progress Notes (Signed)
Patient Name: Katherine Terrell Date of Encounter: 12/02/2015  Principal Problem:   CAP (community acquired pneumonia) Active Problems:   Hypothyroidism   CLL (chronic lymphocytic leukemia) ([place])   Ventricular tachycardia, non-sustained (HCC)   Hyperlipidemia   Essential hypertension   Influenza B   Dehydration   Chronic diastolic heart failure ([place])   Sepsis ([place])   Acute systolic CHF (congestive heart failure) [facility])   Primary Cardiologist: Dr Aundra Dubin  Patient Profile: 55 yo female w/ hx NICM 2011 w/ EF 25-30% during acute illness, nl cors and EF 50-55%; HTN, HLD, hypothyroid w/ multinodular goiter, CLL. Admitted 01/28 w/ PNA and hypoxia. Now with EF 30-35% diffuse hypokinesis. Mild AS, trivial regurg. LA severely dilated. Mod MR, RA was moderately dilated and moderate TR. PA pk pressure 56 mm HG- mod to severe pul. HTN.   SUBJECTIVE: Still coughing a great deal, especially at night. Denies fevers. Feels better, hopes for d/c today.  OBJECTIVE Filed Vitals:   12/01/15 1959 12/01/15 2002 12/02/15 0551 12/02/15 0810  BP:  95/60 122/71   Pulse:  81 85   Temp:  98.4 F (36.9 C) 97 F (36.1 C)   TempSrc:  Oral Oral   Resp:  18 16   Height:      Weight:      SpO2: 96% 97% 100% 96%    Intake/Output Summary (Last 24 hours) at 12/02/15 0819 Last data filed at 12/01/15 1722  Gross per 24 hour  Intake    720 ml  Output    300 ml  Net    420 ml   Filed Weights   11/28/15 0700 11/29/15 0646 12/01/15 0726  Weight: 190 lb 0.6 oz (86.2 kg) 203 lb 11.3 oz (92.4 kg) 183 lb 13.8 oz (83.4 kg)    PHYSICAL EXAM General: Well developed, well nourished, female in no acute distress. Head: Normocephalic, atraumatic.  Neck: Supple without bruits, JVD 9 cm. Lungs:  Resp regular and unlabored, rales bases w/ slight exp wheeze. Heart: RRR, S1, S2, no S3, S4, 2/6 murmur; no rub. Abdomen: Soft, non-tender, non-distended, BS + x 4.  Extremities: No clubbing, cyanosis, edema.  Neuro:  Alert and oriented X 3. Moves all extremities spontaneously. Psych: Normal affect.  LABS: CBC: Recent Labs  11/30/15 0345 12/01/15 0329 12/02/15 0522  WBC 67.5* 63.7* 64.9*  NEUTROABS 4.7 4.5  --   HGB 8.9* 9.0* 9.6*  HCT 29.9* 29.9* 31.2*  MCV 88.5 87.9 84.8  PLT 118* 114* 123XX123*   Basic Metabolic Panel: Recent Labs  11/30/15 0340 12/01/15 0329 12/02/15 0522  NA 139 138 139  K 3.5 3.7 4.3  CL 98* 98* 99*  CO2 30 32 31  GLUCOSE 138* 117* 112*  BUN 12 11 10   CREATININE 1.24* 1.05* 1.02*  CALCIUM 8.5* 8.4* 8.4*  MG 1.8 1.9  --    Cardiac Enzymes: Recent Labs  11/29/15 0858  TROPONINI 0.09*   BNP:  B NATRIURETIC PEPTIDE  Date/Time Value Ref Range Status  11/27/2015 10:16 PM 1803.4* 0.0 - 100.0 pg/mL Final   Fasting Lipid Panel: Recent Labs  11/29/15 0858  CHOL 157  HDL 30*  LDLCALC 104*  TRIG 116  CHOLHDL 5.2   Anemia Panel: Recent Labs  11/29/15 0858  TIBC 286  IRON 14*    TELE:  SR, frequent PVCs, 3 bt runs NSVT, also w/ 5 bt run SVT   Radiology/Studies: No results found.   Current Medications:  . albuterol  2.5 mg Nebulization  TID  . antiseptic oral rinse  7 mL Mouth Rinse BID  . azithromycin  500 mg Intravenous Q24H  . carvedilol  3.125 mg Oral BID WC  . cefTRIAXone (ROCEPHIN)  IV  1 g Intravenous Q24H  . dextromethorphan-guaiFENesin  1 tablet Oral BID  . feeding supplement (ENSURE ENLIVE)  237 mL Oral BID BM  . furosemide  40 mg Oral Daily  . levothyroxine  50 mcg Oral QAC breakfast  . lisinopril  5 mg Oral Daily  . oseltamivir  75 mg Oral BID  . pantoprazole  40 mg Oral Daily      ASSESSMENT AND PLAN:  1. Cardiomyopathy most likely non ischemic will need follow up with Dr. Aundra Dubin in HF clinic. EF 30-35% For 12/07/15 --back on lasix 40 mg po daily will need at discharge - since admit -821  - wt down to 183 from pk of 203.  2. ELEVATED Troponin--flat numbers and patent coronary arteries in 2012 - most likely demand ischemia  from hypotension and hypoxia. - No acute EKG changes.   3. NSVT brief episodes - keep Magnesium > 2 and is to 1.9 and K+ >4.0  - now 4.3 still with freq PVCs and brief bursts of NSVT- Mg pending today, add daily supplement of both - BP not high enough to increase BByet  4. Hypothyroid is stable  5. CLL per IM WBC stable at 64.9  6. Hx G1DD on Echo 2012, u/a to assess on echo this admit   7. HTN - controlled  - range 95/60-125/74  Otherwise, per IM Principal Problem:   CAP (community acquired pneumonia) Active Problems:   Hypothyroidism   CLL (chronic lymphocytic leukemia) ([place])   Ventricular tachycardia, non-sustained (HCC)   Hyperlipidemia   Essential hypertension   Influenza B   Dehydration   Chronic diastolic heart failure (HCC)   Sepsis ([place])   Acute systolic CHF (congestive heart failure) ([place])   Signed, Barrett, Rhonda , PA-C 8:19 AM 12/02/2015  I have examined the patient and reviewed assessment and plan and discussed with patient.  Agree with above as stated.  Stable from a cardiac standpoint.  Now off of oxygen.  Needs medical therapy for nonischemic cardiomyopathy.  F/u with Dr. Aundra Dubin.  Will sign off oat this time.   Shakisha Abend S.

## 2015-12-02 NOTE — Progress Notes (Signed)
Taking over care of pt, agree with previous RN assessment. Pt given water as requested, denies any other needs at this time, will continue to monitor.

## 2015-12-03 LAB — BASIC METABOLIC PANEL
ANION GAP: 8 (ref 5–15)
BUN: 10 mg/dL (ref 6–20)
CALCIUM: 8.7 mg/dL — AB (ref 8.9–10.3)
CO2: 31 mmol/L (ref 22–32)
Chloride: 99 mmol/L — ABNORMAL LOW (ref 101–111)
Creatinine, Ser: 1.01 mg/dL — ABNORMAL HIGH (ref 0.44–1.00)
Glucose, Bld: 115 mg/dL — ABNORMAL HIGH (ref 65–99)
POTASSIUM: 4.7 mmol/L (ref 3.5–5.1)
SODIUM: 138 mmol/L (ref 135–145)

## 2015-12-03 LAB — CBC
HCT: 33.3 % — ABNORMAL LOW (ref 36.0–46.0)
HEMOGLOBIN: 10.1 g/dL — AB (ref 12.0–15.0)
MCH: 26.4 pg (ref 26.0–34.0)
MCHC: 30.3 g/dL (ref 30.0–36.0)
MCV: 86.9 fL (ref 78.0–100.0)
PLATELETS: 152 10*3/uL (ref 150–400)
RBC: 3.83 MIL/uL — AB (ref 3.87–5.11)
RDW: 14.9 % (ref 11.5–15.5)
WBC: 81.1 10*3/uL (ref 4.0–10.5)

## 2015-12-03 LAB — MAGNESIUM: MAGNESIUM: 1.9 mg/dL (ref 1.7–2.4)

## 2015-12-03 MED ORDER — FUROSEMIDE 40 MG PO TABS: 40.0000 mg | ORAL_TABLET | Freq: Every day | ORAL | Status: DC

## 2015-12-03 MED ORDER — PANTOPRAZOLE SODIUM 40 MG PO TBEC: 40.0000 mg | DELAYED_RELEASE_TABLET | Freq: Every day | ORAL | Status: DC

## 2015-12-03 MED ORDER — LEVOFLOXACIN 500 MG PO TABS: 500.0000 mg | ORAL_TABLET | Freq: Every day | ORAL | Status: DC

## 2015-12-03 MED ORDER — ALBUTEROL SULFATE (2.5 MG/3ML) 0.083% IN NEBU: 2.5000 mg | INHALATION_SOLUTION | Freq: Two times a day (BID) | RESPIRATORY_TRACT | Status: DC

## 2015-12-03 MED ORDER — CARVEDILOL 3.125 MG PO TABS: 3.1250 mg | ORAL_TABLET | Freq: Two times a day (BID) | ORAL | Status: DC

## 2015-12-03 MED ORDER — GUAIFENESIN-DM 100-10 MG/5ML PO SYRP: 5.0000 mL | ORAL_SOLUTION | ORAL | Status: DC | PRN

## 2015-12-03 MED ORDER — LISINOPRIL 5 MG PO TABS: 5.0000 mg | ORAL_TABLET | Freq: Every day | ORAL | Status: DC

## 2015-12-03 MED ORDER — ALBUTEROL SULFATE (2.5 MG/3ML) 0.083% IN NEBU: 2.5000 mg | INHALATION_SOLUTION | RESPIRATORY_TRACT | Status: DC | PRN

## 2015-12-03 MED ORDER — DM-GUAIFENESIN ER 30-600 MG PO TB12: 1.0000 | ORAL_TABLET | Freq: Two times a day (BID) | ORAL | Status: DC

## 2015-12-03 NOTE — Discharge Instructions (Signed)
Avian Influenza  Avian influenza is also known as bird flu. Avian influenza is a group of viruses that occurs naturally in wild and domestic birds. Avian influenza is easily spread (contagious) among birds and is deadly to them. Though rare, avian influenza can cause severe illness in humans. CAUSES Infected birds can spread avian influenza through:  Feces.  Nasal secretions.  Saliva. Birds become infected when they come into contact with infected birds or contaminated surfaces. The avian influenza virus is spread from country to country through the international poultry trade or by migrating birds. Avian influenza can then infect humans who:  Have contact with infected birds. The birds can be dead or alive.  Breathe in contaminated dust.  Touch contaminated surfaces. It is rare for there to be human-to-human transmission of avian influenza. However, influenza viruses can change, so human-to-human transmission may become more likely in the future. RISK FACTORS The main risk factor for avian influenza is having exposure to birds or poultry. SIGNS AND SYMPTOMS  Fever.  Cough.  Sore throat.  Nausea and vomiting.  Diarrhea.  Muscle aches.  Tiredness (fatigue).  Inflammation or redness of the eyes (conjunctivitis).  Shortness of breath.  Difficulty breathing.  Abdominal pain.  Seizures.  Mental confusion. DIAGNOSIS Diagnosis may include:  Medical history and physical exam.  Chest X-ray.  Examination of a fluid sample from your throat or nose.  Blood tests. TREATMENT Treatment for avian influenza may include:  Antiviral medicines.  Supportive care to relieve your symptoms. HOME CARE INSTRUCTIONS  Take medicines only as directed by your health care provider.  Use a cool mist humidifier. This makes breathing easier.  Rest as directed by your health care provider.  Drink enough fluid to keep your urine clear or pale yellow.  Cover your mouth and nose  when coughing or sneezing.  Wash your hands well to prevent the virus from spreading.  Avoid crowded areas. Stay home from work or school until directed by your health care provider. PREVENTION  Stay home from work and school when you are sick. Not being in contact with other people will help stop the spread of illness.  Cover your mouth and nose with your arm when coughing or sneezing. This may help keep those around you from getting sick.  Wash your hands often with warm water and soap. Illnesses are often spread when a person touches something that is contaminated with germs and then touches his or her eyes, nose, or mouth.  If you think you have been exposed to avian influenza, ask your health care provider about preventative antiviral medicines. These can help prevent infection from occurring. SEEK MEDICAL CARE IF:  You have a fever.  You have new symptoms.  Your symptoms get worse.  Your symptoms do not get better with treatment. SEEK IMMEDIATE MEDICAL CARE IF:  Your skin or nails turn bluish.  You have a skin rash.  You are urinating noticeably less or not at all.  You have chest pain.  You have trouble breathing.  You feel like your heart is fluttering, skipping a beat, or beating faster than normal.  You have confusion.  You have chills, weakness, or light-headedness.  You develop a sudden headache, or pain in your face or ear.  You have severe pain or stiffness in your neck.  You cough up blood.  You have nausea and vomiting that you cannot control.    This information is not intended to replace advice given to you by your health  care provider. Make sure you discuss any questions you have with your health care provider.   Document Released: 10/19/2003 Document Revised: 11/06/2014 Document Reviewed: 05/19/2014 Elsevier Interactive Patient Education 2016 [place] (Influenza, Adult) La gripe es una infeccin viral del tracto  respiratorio. Ocurre con ms frecuencia en los meses de invierno, ya que las personas pasan ms tiempo en contacto cercano. La gripe puede enfermarlo considerablemente. Se transmite fcilmente de [place] persona a otra (es contagiosa). CAUSAS  La causa es un virus que infecta el tracto respiratorio. Puede contagiarse el virus al aspirar las gotitas que una persona infectada elimina al toser o Brewing technologist. Tambin puede contagiarse al tocar algo que fue recientemente contaminado con el virus y [employer] mano a la boca, la nariz o los ojos. RIESGOS Y COMPLICACIONES Tendr mayor riesgo de sufrir un resfro grave si consume cigarrillos, es diabtico, sufre una enfermedad cardaca (como insuficiencia cardaca) o pulmonar crnica (como asma) o si tiene debilitado el sistema inmunolgico. Los ancianos y las mujeres embarazadas tienen ms riesgo de sufrir infecciones graves. El problema ms frecuente de la gripe es la infeccin pulmonar (neumona). En algunos casos, este problema puede requerir atencin mdica de emergencia y Ship broker en peligro la vida. [place]  Los sntomas pueden durar entre 4 y 39 das y pueden ser:  Cristy Hilts.  Escalofros.  Dolor de [place], dolores en el cuerpo y musculares.  Dolor de Investment banker, operational.  Molestias en el pecho y tos.  Prdida del apetito.  Debilidad o cansancio.  Mareos.  Nuseas o vmitos. DIAGNSTICO  El diagnstico se realiza segn su historia clnica y un examen fsico. Es necesario realizar un anlisis de cultivo farngeo o nasal para confirmar el diagnstico. TRATAMIENTO  En los casos leves, la gripe se cura sin Clinical research associate. El tratamiento est dirigido a Herbalist sntomas. En los casos ms graves, el mdico podr recetar medicamentos antivirales para acortar el curso de la enfermedad. Los antibiticos no son eficaces, ya que la infeccin est causada por un virus y no una bacteria. [place] los medicamentos  solamente como se lo haya indicado el mdico.  Utilice un humidificador de niebla fra para facilitar la respiracin.  Haga reposo hasta que la temperatura vuelva a ser normal. Generalmente esto lleva entre 3 y 4 das.  Beba suficiente lquido para Consulting civil engineer orina clara o de color amarillo plido.  Cbrase la boca y la nariz al toser o Brewing technologist, y [employer] manos muy bien para evitar que se propague el virus.  Foy [place] en su casa y no concurra al [place] o a la escuela hasta que la fiebre haya desaparecido al menos por un da completo. PREVENCIN  La vacunacin anual contra la gripe es la mejor manera de evitar enfermarse. Se recomienda ahora de manera rutinaria una vacuna anual contra la gripe a todos los [employer]. SOLICITE ATENCIN MDICA SI:  Tiene dolor en el pecho, la tos empeora o tiene ms mucosidad.  Tiene nuseas, vmitos o diarrea.  La fiebre regresa o empeora. SOLICITE ATENCIN MDICA DE INMEDIATO SI:   Tiene dificultad para respirar, le falta el [place] uas [place].  Presenta dolor intenso o entumecimiento en el cuello.  Le duele la cabeza de forma repentina o tiene dolor en la cara o el odo.  Tiene nuseas o vmitos que no puede controlar. ASEGRESE DE QUE:   Comprende estas instrucciones.  Controlar su afeccin.  Recibir ayuda de inmediato si no mejora o si empeora.   Esta informacin no tiene Marine scientist el consejo del mdico. Asegrese de hacerle al mdico cualquier pregunta que tenga.   Document Released: 07/26/2005 Document Revised: 11/06/2014 Elsevier Interactive Patient Education [employer].

## 2015-12-03 NOTE — Care Management Note (Signed)
Case Management Note  Patient Details  Name: Marolyn Trotter MRN: [id number] Date of Birth: 1961/06/08  Subjective/Objective:  Patient has agree to [facility] HHRN/social worker services per [facility] rep Karen-orders already placed.Await home neb machine.                  Action/Plan:d/c home w/HHC/DME.   Expected Discharge Date:                  Expected Discharge Plan:  [place]  In-House Referral:  Interpreting Services  Discharge planning Services  CM Consult  Post Acute Care Choice:    Choice offered to:  Patient  DME Arranged:    DME Agency:     HH Arranged:  PT, Social Work [employer] Agency:     Status of Service:  Completed, signed off  Medicare Important Message Given:    Date Medicare IM Given:    Medicare IM give by:    Date Additional Medicare IM Given:    Additional Medicare Important Message give by:     If discussed at [place] of Stay Meetings, dates discussed:    Additional Comments:  Dessa Phi, RN 12/03/2015, 12:58 PM

## 2015-12-03 NOTE — Progress Notes (Signed)
Patient discharged home with family, discharge instructions given and explained to patient/son-inlaw  again and they verbalized understanding, denied any pain/distress. accompanied home by son-inlaw. Transported to the car by staff via wheelchair.

## 2015-12-03 NOTE — Progress Notes (Signed)
Physical Therapy Treatment Patient Details Name: Katherine Terrell MRN: [id number] DOB: [birth year] Today's Date: 12/03/2015    History of Present Illness 55 yo female admitted with sepsis, pna, +flu. Pt is spanish-speaking    PT Comments    Progressing with mobility. Pt tolerated activity well. Slightly unsteady but no LOB. O2 sats >90% on RA with activity.   Follow Up Recommendations   (home safety evaluation)     Equipment Recommendations  None recommended by PT    Recommendations for Other Services       Precautions / Restrictions Precautions Precautions: Fall Precaution Comments: droplet Restrictions Weight Bearing Restrictions: No    Mobility  Bed Mobility Overal bed mobility: Modified Independent                Transfers Overall transfer level: Modified independent                  Ambulation/Gait Ambulation/Gait assistance: Min guard Ambulation Distance (Feet): 300 Feet Assistive device: None Gait Pattern/deviations: Drifts right/left;Staggering right;Staggering left     General Gait Details: O2 sats >90% on RA. unsteady at times but no overt LOB   Stairs Stairs: Yes Stairs assistance: Min guard Stair Management: One rail Right;Step to pattern;Forwards Number of Stairs: 9    Wheelchair Mobility    Modified Rankin (Stroke Patients Only)       Balance           Standing balance support: During functional activity Standing balance-Leahy Scale: Good                      Cognition Arousal/Alertness: Awake/alert Behavior During Therapy: WFL for tasks assessed/performed Overall Cognitive Status: Difficult to assess                      Exercises      General Comments        Pertinent Vitals/Pain Pain Assessment: No/denies pain    Home Living                      Prior Function            PT Goals (current goals can now be found in the care plan section) Progress towards PT goals:  Progressing toward goals    Frequency  Min 3X/week    PT Plan Current plan remains appropriate    Co-evaluation             End of Session Equipment Utilized During Treatment: Gait belt Activity Tolerance: Patient tolerated treatment well Patient left: in bed;with call bell/phone within reach     Time: 1025-1035 PT Time Calculation (min) (ACUTE ONLY): 10 min  Charges:  $Gait Training: 8-22 mins                    G Codes:      Weston Anna, MPT Pager: [phone]

## 2015-12-03 NOTE — Discharge Summary (Signed)
Physician Discharge Summary  Katherine Terrell [id number] DOB: 1961-03-19 DOA: 11/27/2015  PCP: No PCP Per Patient  Admit date: 11/27/2015 Discharge date: 12/03/2015  Recommendations for Outpatient Follow-up:  1. Pt will need to follow up with PCP in 1-2 weeks post discharge 2. Please obtain BMP to evaluate electrolytes and kidney function 3. Please also check CBC to evaluate Hg and Hct levels 4. Statin recommended but pt wants to discuss with PCP before starting   Discharge Diagnoses:  Principal Problem:   CAP (community acquired pneumonia) Active Problems:   Hypothyroidism   CLL (chronic lymphocytic leukemia) ([place])   Ventricular tachycardia, non-sustained (HCC)   Hyperlipidemia   Essential hypertension   Influenza B   Dehydration   Chronic diastolic heart failure (HCC)   Sepsis ([place])   Acute systolic CHF (congestive heart failure) ([place])   Hypoxia  Discharge Condition: Stable  Diet recommendation: Heart healthy diet discussed in details    Brief narrative:    55 year old female with history of hypertension, hyperlipidemia, multinodular goiter with hypothyroidism, CLL (follows with Dr.Shadad) , cardiomyopathy with EF of 25 and 30% as per 2011 (follow-up echo in 11/2010 showed EF of 55-60% with moderate LVH and grade 1 diastolic dysfunction) presented to the ED with fever and cough for the past 1 week. Patient reported that her coworker was having URI symptoms. For the past week patient was having fevers with chills, body aches, fatigue, headache, nasal congestion and postnasal drip.  She went to a clinic in [place], and see on the day of admission and was diagnosed with influenza B and prescribed Tamiflu but she had not filled the prescription since her symptoms worsened and she came to the ED.  In the ED patient was septic with tachycardia, tachypnea, hypoxic with O2 sat in high 80s on room air, hypotensive with systolic blood pressure down to 70s and improved with IV fluid,  febrile with temperature of 102.8F.  Blood work showed chronic leukocytosis secondary to CLL, drop in her hemoglobin to 8.7 from her baseline around 11 and thrombocytopenia with platelets of 108. Chemistry showed potassium of 3.3, elevated troponin of 0.14 and normal lactic acid. BNP was elevated to 1800. UA was unremarkable except for hemoglobinuria. Chest x-ray showed left lower lobe infiltrate. Patient admitted to stepdown for sepsis secondary to community-acquired pneumonia and influenza B.  Assessment/Plan:    Sepsis secondary to community-acquired pneumonia and influenza B - clinically improving, eating better, ambulating, PT eval requested  - continue Levaquin upon discharge to complete therapy  - please note that tamiflu therapy completed  - allow antitussives as needed   NSVT and PVCs - EKG showed PVCs with T-wave inversion in inferior leads. Continue telemetry monitoring. - keep electrolytes stable  Acute systolic CHF - 2-D echo showed EF of 30-35% with diffuse hypokinesis, ? associated with acute viral cardiomyopathy with sepsis. - elevated troponin due to demand ischemia. She was last seen by cardiologist Dr Aundra Dubin in [place] - continue daily PO Lasix, beta blocker and ACE inhibitor - weight down from 200 lbs to 183 lbs - plan on outpatient follow-up with Dr. Aundra Dubin the heart failure clinic upon discharge.  - LDL of 104.   Acute renal injury - resolving  Epigastric pain - Possibly associated with gastritis - resolved   Essential HTN - continue Coreg, Lisinopril, Lasix   Hypothyroidism - TSH normal.  - Patient informed that her Synthroid was recently increased to 50 g.  Iron deficiency anemia.  - Hg overall stable in the past  24 hours   Thrombocytopenia - reactive, stable in the past 24 hours   CLL - Follows with Dr.Shadad and is on surveillance.  Code Status: Full.  Family Communication: plan of care discussed with the patient Disposition  Plan: D/C home  IV access:  Peripheral IV  Procedures and diagnostic studies:   Dg Chest 2 View 11/27/2015 Posterior left lower lobe airspace opacity, suspicious for pneumonia. Followup PA and lateral chest X-ray is recommended in 3-4 weeks following trial of antibiotic therapy to ensure resolution and exclude underlying malignancy. Mild cardiomegaly.   Medical Consultants:  Cardiology        Discharge Exam: Filed Vitals:   12/03/15 0405 12/03/15 0942  BP: 136/78 121/71  Pulse: 81   Temp: 97.8 F (36.6 C)   Resp: 18    Filed Vitals:   12/02/15 2148 12/03/15 0405 12/03/15 0942 12/03/15 1002  BP: 113/67 136/78 121/71   Pulse: 81 81    Temp: 98.4 F (36.9 C) 97.8 F (36.6 C)    TempSrc: Oral Oral    Resp: 16 18    Height:      Weight:      SpO2: 93% 98%  97%    General: Pt is alert, follows commands appropriately, not in acute distress Cardiovascular: Regular rate and rhythm, no rubs, no gallops Respiratory: Clear to auscultation bilaterally, rhonchi at bases minimal  Extremities: no edema, no cyanosis, pulses palpable bilaterally DP and PT Neuro: Grossly nonfocal  Discharge Instructions  Discharge Instructions    Diet - low sodium heart healthy    Complete by:  As directed      Increase activity slowly    Complete by:  As directed             Medication List    TAKE these medications        acetaminophen 325 MG tablet  Commonly known as:  TYLENOL  Take 650 mg by mouth every 6 (six) hours as needed for moderate pain or headache.     albuterol (2.5 MG/3ML) 0.083% nebulizer solution  Commonly known as:  PROVENTIL  Take 3 mLs (2.5 mg total) by nebulization every 4 (four) hours as needed for wheezing or shortness of breath.     carvedilol 3.125 MG tablet  Commonly known as:  COREG  Take 1 tablet (3.125 mg total) by mouth 2 (two) times daily with a meal.     dextromethorphan-guaiFENesin 30-600 MG 12hr tablet  Commonly known as:  MUCINEX DM   Take 1 tablet by mouth 2 (two) times daily.     guaiFENesin-dextromethorphan 100-10 MG/5ML syrup  Commonly known as:  ROBITUSSIN DM  Take 5 mLs by mouth every 4 (four) hours as needed for cough.     furosemide 40 MG tablet  Commonly known as:  LASIX  Take 1 tablet (40 mg total) by mouth daily.     levofloxacin 500 MG tablet  Commonly known as:  LEVAQUIN  Take 1 tablet (500 mg total) by mouth daily.     levothyroxine 50 MCG tablet  Commonly known as:  SYNTHROID, LEVOTHROID  Take 50 mcg by mouth daily before breakfast.     lisinopril 5 MG tablet  Commonly known as:  PRINIVIL,ZESTRIL  Take 1 tablet (5 mg total) by mouth daily.     pantoprazole 40 MG tablet  Commonly known as:  PROTONIX  Take 1 tablet (40 mg total) by mouth daily.  Follow-up Information    Follow up with Loralie Champagne, MD On 12/09/2015.   Specialty:  Cardiology   Why:  at 9:30 Am with his NP/PA  code for Feb for parking 0001   Contact information:   [address] [place] [address] [phone]       Call Faye Ramsay, MD.   Specialty:  Internal Medicine   Why:  As needed   Contact information:   [address] [place] [address] [phone]        The results of significant diagnostics from this hospitalization (including imaging, microbiology, ancillary and laboratory) are listed below for reference.     Microbiology: Recent Results (from the past 240 hour(s))  Culture, blood (routine x 2)     Status: None   Collection Time: 11/27/15  2:38 PM  Result Value Ref Range Status   Specimen Description BLOOD LEFT ANTECUBITAL  Final   Special Requests   Final    BOTTLES DRAWN AEROBIC AND ANAEROBIC 5CC BOTH BOTTLES   Culture   Final    NO GROWTH 5 DAYS Performed at [facility]    Report Status 12/02/2015 FINAL  Final  Culture, blood (routine x 2)     Status: None   Collection Time: 11/27/15  2:48 PM  Result Value Ref Range  Status   Specimen Description BLOOD RIGHT ANTECUBITAL  Final   Special Requests BOTTLES DRAWN AEROBIC AND ANAEROBIC 5 CC EACH  Final   Culture   Final    NO GROWTH 5 DAYS Performed at [facility]    Report Status 12/02/2015 FINAL  Final  Urine culture     Status: None   Collection Time: 11/27/15  4:40 PM  Result Value Ref Range Status   Specimen Description URINE, CLEAN CATCH  Final   Special Requests NONE  Final   Culture   Final    1,000 COLONIES/mL INSIGNIFICANT GROWTH Performed at [facility]    Report Status 11/28/2015 FINAL  Final  MRSA PCR Screening     Status: None   Collection Time: 11/27/15  9:34 PM  Result Value Ref Range Status   MRSA by PCR NEGATIVE NEGATIVE Final    Comment:        The GeneXpert MRSA Assay (FDA approved for NASAL specimens only), is one component of a comprehensive MRSA colonization surveillance program. It is not intended to diagnose MRSA infection nor to guide or monitor treatment for MRSA infections.   Culture, expectorated sputum-assessment     Status: None   Collection Time: 11/28/15  8:00 PM  Result Value Ref Range Status   Specimen Description SPUTUM  Final   Special Requests Normal  Final   Sputum evaluation   Final    THIS SPECIMEN IS ACCEPTABLE. RESPIRATORY CULTURE REPORT TO FOLLOW.   Report Status 11/28/2015 FINAL  Final  Culture, respiratory (NON-Expectorated)     Status: None   Collection Time: 11/28/15  8:00 PM  Result Value Ref Range Status   Specimen Description SPU  Final   Special Requests NONE  Final   Gram Stain   Final    NO WBC SEEN NO SQUAMOUS EPITHELIAL CELLS SEEN NO ORGANISMS SEEN Performed at [facility]    Culture   Final    NORMAL OROPHARYNGEAL FLORA Performed at [facility]    Report Status 12/01/2015 FINAL  Final     Labs: Basic Metabolic Panel:  Recent Labs Lab 11/27/15  2216  11/29/15 0406 11/29/15 ID:[id number] 11/30/15 0340 12/01/15 0329 12/02/15 0522  12/03/15 0540  NA 138  < > 138  --  139 138 139 138  K 3.3*  < > 4.3  --  3.5 3.7 4.3 4.7  CL 111  < > 103  --  98* 98* 99* 99*  CO2 23  < > 26  --  30 32 31 31  GLUCOSE 120*  < > 124*  --  138* 117* 112* 115*  BUN 10  < > 11  --  12 11 10 10   CREATININE 0.98  < > 0.99  --  1.24* 1.05* 1.02* 1.01*  CALCIUM 7.3*  < > 8.1*  --  8.5* 8.4* 8.4* 8.7*  MG  --   < >  --  1.9 1.8 1.9 1.9 1.9  PHOS 3.5  --   --   --   --   --   --   --   < > = values in this interval not displayed. Liver Function Tests:  Recent Labs Lab 11/27/15 1452 11/28/15 0351 11/29/15 0406  AST 32 29 27  ALT 16 16 17   ALKPHOS 102 77 69  BILITOT 0.8 0.7 0.4  PROT 7.8 6.2* 6.4*  ALBUMIN 4.1 3.2* 3.2*    Recent Labs Lab 11/30/15 0345  LIPASE 34   CBC:  Recent Labs Lab 11/28/15 0351 11/29/15 0406 11/30/15 0345 12/01/15 0329 12/02/15 0522 12/03/15 0540  WBC 67.9* 69.9* 67.5* 63.7* 64.9* 81.1*  NEUTROABS 4.1 4.9 4.7 4.5  --   --   HGB 9.4* 8.8* 8.9* 9.0* 9.6* 10.1*  HCT 31.2* 29.2* 29.9* 29.9* 31.2* 33.3*  MCV 87.9 88.5 88.5 87.9 84.8 86.9  PLT 114* 113* 118* 114* 121* 152   Cardiac Enzymes:  Recent Labs Lab 11/27/15 2216 11/28/15 0351 11/28/15 0959 11/28/15 1611 11/29/15 0858  TROPONINI 0.14* 0.10* 0.09* 0.11* 0.09*   BNP: BNP (last 3 results)  Recent Labs  11/27/15 2216  BNP 1803.4*   SIGNED: Time coordinating discharge: 30 minutes  Faye Ramsay, MD  [facility] 12/03/2015, 10:35 AM Pager [phone]  If 7PM-7AM, please contact night-coverage [email or web address] Password [email or web address]

## 2015-12-03 NOTE — Progress Notes (Signed)
Discharge instructions given to patient using the interpreter, patient verbalized understanding, patient wanted her son-law called regarding discharge. Son-inlaw- Arsenio called and information given over the phone, stated he will try to find someone to pick her up. Waiting for call back with update regarding transportation.  Patient denies any pain/distress. No wound noted,skin intact.

## 2015-12-03 NOTE — Care Management Note (Signed)
Case Management Note  Patient Details  Name: Steva Mulanax MRN: [id number] Date of Birth: 30-Aug-1961  Subjective/Objective: 54 y/o f admitted w/PNA. Spanish speaking. From home.spoke to patient using interpreter service-Patient has a cardiologist that she uses for her pcp,appt already set, she gets her meds filled @ the office clinic. Informed about HHC.States she has no money to pay for a home care nurse-attempted to explain that they may help her with patient assistance but not sure if even with interpreter it was understood since she still said no.  [facility] liason Santiago Glad will still talk to patient using interpreter service since patient agreed to letting [facility] talk to her. Will await outcome. [facility] dme liason Colletta [place] aware of neb machine order to bring to rm & discuss patient assistance since patient already states she cannot pay for it.Patient voiced understanding.  Nsg aware of current d/c plan.                  Action/Plan:d/c home.   Expected Discharge Date:                  Expected Discharge Plan:  [place]  In-House Referral:  NA  Discharge planning Services  CM Consult  Post Acute Care Choice:  NA Choice offered to:  NA  DME Arranged:    DME Agency:     HH Arranged:  Patient Refused [place] Agency:     Status of Service:  Completed, signed off  Medicare Important Message Given:    Date Medicare IM Given:    Medicare IM give by:    Date Additional Medicare IM Given:    Additional Medicare Important Message give by:     If discussed at [place] of Stay Meetings, dates discussed:    Additional Comments:  Dessa Phi, RN 12/03/2015, 12:37 PM

## 2015-12-04 ENCOUNTER — Telehealth: Payer: Self-pay | Admitting: Physician Assistant

## 2015-12-04 NOTE — Telephone Encounter (Signed)
Called by answering service --> Almyra Free [facility] interpreter) inquired discharge medications. Updated.   Katherine Terrell, [place]

## 2015-12-08 ENCOUNTER — Encounter (HOSPITAL_COMMUNITY): Payer: Self-pay

## 2015-12-08 ENCOUNTER — Emergency Department (HOSPITAL_COMMUNITY)
Admission: EM | Admit: 2015-12-08 | Discharge: 2015-12-08 | Disposition: A | Discharge status: Home / Self Care | Payer: MEDICAID | Attending: Emergency Medicine | Admitting: Emergency Medicine

## 2015-12-08 ENCOUNTER — Emergency Department (HOSPITAL_COMMUNITY): Payer: MEDICAID

## 2015-12-08 DIAGNOSIS — Z79899 Other long term (current) drug therapy: Secondary | ICD-10-CM | POA: Insufficient documentation

## 2015-12-08 DIAGNOSIS — E039 Hypothyroidism, unspecified: Secondary | ICD-10-CM | POA: Insufficient documentation

## 2015-12-08 DIAGNOSIS — C9190 Lymphoid leukemia, unspecified not having achieved remission: Secondary | ICD-10-CM | POA: Insufficient documentation

## 2015-12-08 DIAGNOSIS — I10 Essential (primary) hypertension: Secondary | ICD-10-CM | POA: Insufficient documentation

## 2015-12-08 DIAGNOSIS — Z9889 Other specified postprocedural states: Secondary | ICD-10-CM | POA: Insufficient documentation

## 2015-12-08 DIAGNOSIS — C9110 Chronic lymphocytic leukemia of B-cell type not having achieved remission: Secondary | ICD-10-CM

## 2015-12-08 DIAGNOSIS — Z792 Long term (current) use of antibiotics: Secondary | ICD-10-CM | POA: Insufficient documentation

## 2015-12-08 HISTORY — DX: Leukemia, unspecified not having achieved remission: C95.90

## 2015-12-08 LAB — BASIC METABOLIC PANEL
Anion gap: 9 (ref 5–15)
BUN: 15 mg/dL (ref 6–20)
CO2: 23 mmol/L (ref 22–32)
Calcium: 9 mg/dL (ref 8.9–10.3)
Chloride: 106 mmol/L (ref 101–111)
Creatinine, Ser: 1.09 mg/dL — ABNORMAL HIGH (ref 0.44–1.00)
GFR calc Af Amer: >60 mL/min (ref >60)
GFR calc non Af Amer: 57 mL/min — ABNORMAL LOW (ref >60)
Glucose, Bld: 103 mg/dL — ABNORMAL HIGH (ref 65–99)
Potassium: 4.8 mmol/L (ref 3.5–5.1)
Sodium: 138 mmol/L (ref 135–145)

## 2015-12-08 LAB — CBC WITH DIFFERENTIAL/PLATELET
Basophils Absolute: 1 10*3/uL — ABNORMAL HIGH (ref 0.0–0.1)
Basophils Relative: 1 %
Eosinophils Absolute: 0 10*3/uL (ref 0.0–0.7)
Eosinophils Relative: 0 %
HCT: 36.9 % (ref 36.0–46.0)
Hemoglobin: 11.3 g/dL — ABNORMAL LOW (ref 12.0–15.0)
Lymphocytes Relative: 88 %
Lymphs Abs: 90.8 10*3/uL — ABNORMAL HIGH (ref 0.7–4.0)
MCH: 26.7 pg (ref 26.0–34.0)
MCHC: 30.6 g/dL (ref 30.0–36.0)
MCV: 87 fL (ref 78.0–100.0)
Monocytes Absolute: 2.1 10*3/uL — ABNORMAL HIGH (ref 0.1–1.0)
Monocytes Relative: 2 %
Neutro Abs: 9.3 10*3/uL — ABNORMAL HIGH (ref 1.7–7.7)
Neutrophils Relative %: 9 %
Platelets: UNDETERMINED 10*3/uL (ref 150–400)
RBC: 4.24 MIL/uL (ref 3.87–5.11)
RDW: 15.2 % (ref 11.5–15.5)
WBC: 103.2 10*3/uL (ref 4.0–10.5)

## 2015-12-08 NOTE — ED Notes (Addendum)
[place]  [phone]

## 2015-12-08 NOTE — Progress Notes (Signed)
Patient ID: Katherine Terrell, female   DOB: Sep 20, 1961, 55 y.o.   MRN: [id number]    Advanced Heart Failure Clinic Note   Primary Care: None Primary Cardiologist: Dr Aundra Dubin   HPI:  55 year old female with history of HTN, CLL, NICM with EF that initially improved to 50-55% in 2012 with med adjustment, and hypothyroidism. Previously followed by Dr Aundra Dubin but had not followed up with cardiology since 2012 prior to admission in 10/2015.  Admitted 11/27/15 with influenza B and had been placed on Tamiflu by PCP but had not yet started. CXR with LLL infiltrate and sp02 89-91%. EKG with ST and PVCs and couplets. In the ER her BP dropped from 123456 systolic to the [id number]. IV fluids given. Pt admitted. Echo showed EF 30-35% diffuse hypokinesis. Mild AS, trivial regurg. LA severely dilated. Mod MR, RA was moderately dilated and moderate TR. PA pk pressure 56 mm HG- mod pul HTN. EF is down from 50-55% in 2012.  Of note, she has hx of cardiomyopathy 08/2010 during hospitalization for "aypical PNA" with EF 25-30% unknown etiology. Cardiac cath performed by Dr. Aundra Dubin 10/2010 with normal coronary arteries. Left ventriculogram with cath and echo in 11/2010 showed Repeat echo after cath with EF 50-55%.  She reports today for post hospital follow up with newly reduced EF (recurrent). States 3-4 days prior to hospitalization she was having SOB and chest tightness with coughing only.  She denies DOE, no problem walking on flat grounds. No problems with stairs. She hasn't had chest tightness since Saturday, and it was only with coughing. Not related to activity. Denies SOB or DOE, again, only with coughing episodes.  Denies edema.  She is not currently lightheadedness or dizziness. She was having dizziness prior to her hospitalization, but this has improved. She is not currently being treated for her CLL. Is seen every 4-6 months. Last visit 10/07/15.  Labs (11/11): HIV negative, ANA negative, K 3.9, creatinine 1.12, BNP  135 Labs (1/12): LDL 98, HDL 38, BNP 27, K 4.3, creatinine 1.2 Labs (2/12): TSH normal Labs (12/08/15) K 4.8, Cr 1.09  ECG: NSR, LVH with repolarization abnormality.   PMH 1. Chronic lymphocytic leukemia: Follows with Dr Alen Blew. Mild hepatosplenomegaly.  No treatment yet.  2. Hyperlipidemia 3. Hypertension  4. Multinodular goiter with hypothyroidism 5. Cardiomyopathy, NICM: Echo (11/11) EF 25-30% with diffuse hypokinesis, mild left atrial enlargement. 11/11 HIV and ANA negative. TSH normal. LHC (1/12): EF 50-55%, no angiographic CAD. Echo (2/12): EF 50-55%, moderate LV hypertrophy, grade I diastolic dysfunction, mild MR, normal RV.  - With recent admission 1/17 Echo showed EF down again to 30-35%, mild aortic stenosis, moderate TR, PASP 56 mmHg.  6. Aortic stenosis: Mild on 1/17 echo.   ROS: All systems reviewed and negative except as per HPI.   Current Outpatient Prescriptions  Medication Sig Dispense Refill  . acetaminophen (TYLENOL) 325 MG tablet Take 650 mg by mouth every 6 (six) hours as needed for moderate pain or headache.     . albuterol (PROVENTIL) (2.5 MG/3ML) 0.083% nebulizer solution Take 3 mLs (2.5 mg total) by nebulization every 4 (four) hours as needed for wheezing or shortness of breath. 75 mL 3  . carvedilol (COREG) 6.25 MG tablet Take 6.25 mg by mouth 2 (two) times daily with a meal.    . dextromethorphan-guaiFENesin (MUCINEX DM) 30-600 MG 12hr tablet Take 1 tablet by mouth 2 (two) times daily. 60 tablet 0  . furosemide (LASIX) 40 MG tablet Take 1 tablet (40 mg  total) by mouth daily. 30 tablet 1  . levothyroxine (SYNTHROID, LEVOTHROID) 50 MCG tablet Take 50 mcg by mouth daily before breakfast.    . lisinopril (PRINIVIL,ZESTRIL) 20 MG tablet Take 20 mg by mouth daily.    . pantoprazole (PROTONIX) 40 MG tablet Take 1 tablet (40 mg total) by mouth daily. 30 tablet 0   No current facility-administered medications for this encounter.    Allergies  Allergen Reactions   . Tramadol Nausea And Vomiting      Social History   Social History  . Marital Status: Single    Spouse Name: N/A  . Number of Children: N/A  . Years of Education: N/A   Occupational History  . Works in [employer].     Social History Main Topics  . Smoking status: Never Smoker   . Smokeless tobacco: Never Used  . Alcohol Use: No  . Drug Use: No  . Sexual Activity: Not Currently    Birth Control/ Protection: None   Other Topics Concern  . Not on file   Social History Narrative   From [place], Lives in [place]. Speaks minimal English.       Family History  Problem Relation Age of Onset  . Heart attack Mother 81    Filed Vitals:   12/09/15 0925  BP: 114/62  Pulse: 86  Weight: 181 lb 12.8 oz (82.464 kg)  SpO2: 96%    PHYSICAL EXAM: General:  Well appearing. NAD HEENT: normal Neck: supple. JVD 6-7 cm. Carotids 2+ bilat; no bruits. No thryomegaly or nodule noted. Cor: PMI nondisplaced. RRR No rubs, gallops or murmurs. Lungs: mild coarse basilar sounds that clear with cough. Normal effort Abdomen: soft, NT, ND, no HSM. No bruits or masses. +BS  Extremities: no cyanosis, clubbing, rash, edema Neuro: alert & oriented x 3, cranial nerves grossly intact. moves all 4 extremities w/o difficulty. Affect pleasant.  ECG:  NSR 70, LDH, Incomplete LBBB  ASSESSMENT & PLAN:  1. Chronic systolic HF - EF 99991111 down from 50-55% in 2012 - Normal coronaries 2012. Needs new ischemic work up.  - had labs drawn yesterday, non needed today.  Depending on timing of cath may need pre-cath labs redrawn. - Last fall in EF thought to be episode of myocarditis with rapid improvement.  - Increase Coreg to 12.5 mg to BID - Continue lisinopril 20 mg daily.  2. CLL - Per IM recent admission stable, despite WBC in 60s - Up in 100s most recent check.   - Per last heme onc note "rapid doubling" is a worrisome symptom and should prompt follow up.  - Encouraged patient multiple time to  make follow up with Dr Alen Blew. 3.  HTN - Stable on current meds. Has some room. - Increase coreg as above. Continue lisinopril 4. HLD - Total 157, LDL 104, HDL, 30. TG 116. 11/29/15 - Will await cath for need for statin   LHC scheduled for next week. Follow up 6 weeks with Dr. Aundra Dubin. Had labs yesterday.  Legrand Como "Jonni Sanger" Chalmers Cater, PA-C 12/09/2015   Patient seen with PA, agree with the above note.  She was recently admitted with influenza B and evidence for PNA.  She was very short of breath.  Echo was done, showing that EF had fallen to 30-35%.  She is doing better now, still very fatigued but dyspnea has mostly resolved.    New fall in EF is concerning. 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  However, I do agree that coronary angiography would be warranted to rule out CAD/ischemic cardiomyopathy.  She does have some risk factors, including HTN and hyperlipidemia.  I discussed the risks/benefits of the procedure today via an interpreter.  She agrees to proceed.  We will try to contact her family as well.   Increase Coreg to 12.5 mg bid, continue lisinopril.  Will add spironolactone as next step in future.   WBCs higher than her baseline, she will need followup with Dr. Alen Blew.  Loralie Champagne 12/09/2015 5:15 PM

## 2015-12-08 NOTE — ED Notes (Signed)
Vital signs stable. 

## 2015-12-08 NOTE — ED Notes (Signed)
Patient had blood work completed yesterday. Patient was recently discharged from the hospital with diagnosis of pneumonia. Patient was called by her physician today and was told to come to the ED because her blood work showed infection.

## 2015-12-08 NOTE — Discharge Instructions (Signed)
Leucemia linfoctica crnica (Chronic Lymphocytic Leukemia) La leucemia linfoctica crnica (LLC) es un tipo de cncer de la mdula sea y las clulas sanguneas. La mdula sea es el tejido blando y esponjoso que est dentro de los [place]. En la LLC, la mdula sea produce demasiados glbulos blancos, que habitualmente se encargan de combatir las infecciones del cuerpo (linfocitos). [place] a poco y es el tipo de leucemia ms frecuente en los adultos.  [place]. El riesgo de [place] es mayor en las personas de:   Ms de 89 aos de edad.  Raza blanca.  Sexo masculino.  Que tengan antecedentes familiares de LLC o de otros tipos de cncer del sistema linftico.  Ascendencia juda rusa o juda proveniente de [place].  Exposicin anterior a ciertas sustancias qumicas, como agente naranja (usado en la guerra de [place]) u otros herbicidas o insecticidas. SNTOMAS  Al principio, puede no haber sntomas de leucemia linfoctica crnica. Luego de un tiempo, pueden aparecer algunos sntomas como:   Sensacin de mayor cansancio de lo habitual, incluso despus de haber dormido.  Prdida de peso sin causa aparente.  Sudoracin profusa a la noche.  Cristy Hilts.  Falta de aire.  Disminucin de Sales promotion account executive.  Palidez.  Ganglios linfticos hinchados, indoloros.  Sensacin de plenitud en la parte superior del abdomen.  Tener moretones o hemorragias con facilidad.  Infecciones frecuentes. DIAGNSTICO  El mdico podr indicar los siguientes exmenes y estudios para diagnosticar la LLC:  Un examen fsico para verificar si el bazo, el hgado o los ganglios linfticos estn agrandados.  Estudios de [place] y mdula sea para identificar la presencia de clulas cancerosas. Estos pueden incluir un hemograma completo, citometra de flujo, inmunofenotipificacin e hibridacin in situ con fluorescencia (FISH).  Tomografa  computarizada para detectar hinchazn o anomalas en el bazo, el hgado y los ganglios linfticos. TRATAMIENTO  Las opciones de tratamiento para la LLC dependen del estadio y la presencia de sntomas. Hay diferentes tipos de tratamiento para esta enfermedad. Algunos son:  Observacin.  Medicamentos dirigidos. Estos medicamentos afectan a las sustancias qumicas que las clulas de la leucemia necesitan para desarrollarse y multiplicarse. Identifican y atacan clulas cancerosas especficas sin daar a las clulas normales.  Medicamentos de quimioterapia. Estos medicamentos destruyen las clulas que se multiplican rpidamente, como las clulas de la leucemia.  Radiacin.  Ciruga para extirpar el bazo.  Terapia biolgica. Este tratamiento fomenta la capacidad del sistema inmunolgico para combatir las clulas de la leucemia.  Trasplante de clulas madre de mdula sea o sangre perifrica. Este tratamiento permite que el paciente reciba dosis muy altas de quimioterapia o radioterapia, o de [place]. Estas dosis altas destruyen las clulas cancerosas, pero tambin destruyen la mdula sea. Despus de completar el tratamiento, le aplicarn mdula sea o clulas madre de un donante, lo cual reemplazar la mdula sea. INSTRUCCIONES PARA EL CUIDADO EN EL HOGAR   Debido a que su riesgo de infecciones es ms alto, lvese bien las manos y evite estar cerca de gente enferma o estar en lugares muy concurridos.  Debido a que tiene ms riesgo de sangrado y hematomas, evite practicar deportes de contacto u otras actividades bruscas.  [place] [employer] que le haya indicado el mdico.  Aunque algunos tratamientos podran afectar su apetito, trate de comer de manera regular, alimentos saludables.  Informe a su mdico si tiene [employer], como nuseas, diarrea, erupcin cutnea, manchas blancas en la boca, dolor  de garganta, dificultad para tragar o fatiga intensa. Le dar indicaciones de  lo que puede hacer para [employer] sntomas.  Considere la posibilidad de aprender formas de enfrentar el estrs que acarrea una enfermedad crnica, por ejemplo, yoga o meditacin, o de participar en un grupo de apoyo. SOLICITE ATENCIN MDICA SI:  Electronics engineer.  Siente dolor o nota hinchazn o enrojecimiento en algn lugar de las piernas.  Tiene dolor de vientre (abdomen).  Tiene hematomas nuevos que se agrandan.  Tiene dolor en los ganglios linfticos o se le hinchan ms.  Tiene sangrado de las encas o la nariz, o sangre en la orina o la materia fecal.  Vomita sin parar y no puede evitarlo.  No puede retener los lquidos.  Tiene sensacin de desvanecimiento.  Tiene fiebre.  Siente un fuerte dolor de cabeza o el cuello muy rgido. SOLICITE ATENCIN MDICA DE INMEDIATO SI:  Siente dificultad para respirar o Risk manager.  Se desmaya.   Esta informacin no tiene Marine scientist el consejo del mdico. Asegrese de hacerle al mdico cualquier pregunta que tenga.   Document Released: 06/18/2013 Document Revised: 11/06/2014 Elsevier Interactive Patient Education [employer].

## 2015-12-08 NOTE — ED Provider Notes (Signed)
CSN: [id number]     Arrival date & time 12/08/15  1023 History  Contact with Patient 12/08/15 1125       Chief Complaint  Patient presents with  . Abnormal Lab     (Consider location/radiation/quality/duration/timing/severity/associated sxs/prior Treatment) HPI   54yF with abnormal labs. Language barrier. Interpretor used.  Pt called by clinic in [place] with abnormal blood work. "They told me I still had an infection." Pt is unsure of what specifically was being referred to and she does not have lab results with her. She was recently admitted with pneumonia. She was following up after hospitalization and which is why blood work was done. Has continued to feel fatigues and achy, but slowly improving. No fever. Still occasional cough. No dyspnea.   Past Medical History  Diagnosis Date  . Hyperlipidemia   . Hypertension   . Multinodular goiter     with hypothyroidism  . Cardiomyopathy     echo: (11/11) EF 25-30% with diffuse hypokinesis, mild left atrial enlargement. 11/11 HIV and ANA negative. TSH normal. LHC (1/12): EF 50-55% no angiographic CAD. Echo (2/12): EF 55-60% moderate LV hypertrophy, grade I diastolic dysfunction, mild MR, normal RV  . Chronic lymphocytic leukemia ([facility])     followed by Dr. Ralene Ok  . Leukemia [facility])    Past Surgical History  Procedure Laterality Date  . Cardiac catheterization  2012    @ [place]   Family History  Problem Relation Age of Onset  . Heart attack Mother 37   Social History  Substance Use Topics  . Smoking status: Never Smoker   . Smokeless tobacco: Never Used  . Alcohol Use: No   OB History    Gravida Para Term Preterm AB TAB SAB Ectopic Multiple Living   3 3 3       3      Review of Systems  All systems reviewed and negative, other than as noted in HPI.   Allergies  Tramadol and Other  Home Medications   Prior to Admission medications   Medication Sig Start Date End Date Taking? Authorizing Provider  acetaminophen  (TYLENOL) 325 MG tablet Take 650 mg by mouth every 6 (six) hours as needed for moderate pain or headache.    Yes Historical Provider, MD  albuterol (PROVENTIL) (2.5 MG/3ML) 0.083% nebulizer solution Take 3 mLs (2.5 mg total) by nebulization every 4 (four) hours as needed for wheezing or shortness of breath. 12/03/15  Yes Theodis Blaze, MD  carvedilol (COREG) 3.125 MG tablet Take 1 tablet (3.125 mg total) by mouth 2 (two) times daily with a meal. 12/03/15  Yes Theodis Blaze, MD  dextromethorphan-guaiFENesin [facility] DM) 30-600 MG 12hr tablet Take 1 tablet by mouth 2 (two) times daily. 12/03/15  Yes Theodis Blaze, MD  furosemide (LASIX) 40 MG tablet Take 1 tablet (40 mg total) by mouth daily. 12/03/15  Yes Theodis Blaze, MD  levothyroxine (SYNTHROID, LEVOTHROID) 50 MCG tablet Take 50 mcg by mouth daily before breakfast.   Yes Historical Provider, MD  lisinopril (PRINIVIL,ZESTRIL) 5 MG tablet Take 1 tablet (5 mg total) by mouth daily. 12/03/15  Yes Theodis Blaze, MD  Multiple Vitamin (MULTIVITAMIN WITH MINERALS) TABS tablet Take 1 tablet by mouth daily.   Yes Historical Provider, MD  pantoprazole (PROTONIX) 40 MG tablet Take 1 tablet (40 mg total) by mouth daily. 12/03/15  Yes Theodis Blaze, MD  guaiFENesin-dextromethorphan (ROBITUSSIN DM) 100-10 MG/5ML syrup Take 5 mLs by mouth every 4 (four) hours  as needed for cough. Patient not taking: Reported on 12/08/2015 12/03/15   Theodis Blaze, MD  levofloxacin (LEVAQUIN) 500 MG tablet Take 1 tablet (500 mg total) by mouth daily. 12/03/15   Theodis Blaze, MD   BP 136/79 mmHg  Pulse 84  Temp(Src) 98.2 F (36.8 C) (Oral)  Resp 20  SpO2 95% Physical Exam  Constitutional: She appears well-developed and well-nourished. No distress.  HENT:  Head: Normocephalic and atraumatic.  Eyes: Conjunctivae are normal. Right eye exhibits no discharge. Left eye exhibits no discharge.  Neck: Neck supple.  Cardiovascular: Normal rate, regular rhythm and normal heart sounds.  Exam reveals  no gallop and no friction rub.   No murmur heard. Pulmonary/Chest: Effort normal and breath sounds normal. No respiratory distress.  Abdominal: Soft. She exhibits no distension. There is no tenderness.  Musculoskeletal: She exhibits no edema or tenderness.  Neurological: She is alert.  Skin: Skin is warm and dry.  Psychiatric: She has a normal mood and affect. Her behavior is normal. Thought content normal.  Nursing note and vitals reviewed.   ED Course  Procedures (including critical care time) Labs Review Labs Reviewed  CBC WITH DIFFERENTIAL/PLATELET - Abnormal; Notable for the following:    WBC 103.2 (*)    Hemoglobin 11.3 (*)    Neutro Abs 9.3 (*)    Lymphs Abs 90.8 (*)    Monocytes Absolute 2.1 (*)    Basophils Absolute 1.0 (*)    All other components within normal limits  BASIC METABOLIC PANEL - Abnormal; Notable for the following:    Glucose, Bld 103 (*)    Creatinine, Ser 1.09 (*)    GFR calc non Af Amer 57 (*)    All other components within normal limits    Imaging Review No results found. I have personally reviewed and evaluated these images and lab results as part of my medical decision-making.   EKG Interpretation None      MDM   Final diagnoses:  CLL (chronic lymphocytic leukemia) ([place])     54yF with abnormal labs. Known CLL. Spoke with clinic in [place]. Provider that called patient today was covering for someone else and unaware of pt's hx of CLL. Reports that WBC 116,000 on labs they obtained. During hospitalization was in 60,000s but labs between 2-8 months ago 100-110,000. Is being followed by heme/onc. Denies any new lymphadenopathy. Feels tired/fatigued/achy, etc but this is not unexpected with just being discharged from the hospital with pneumonia 5 days ago. Will repeat CBC again now. If has continued to increased significantly then will discuss with heme/onc.   WBC 103,000 with 91,000 absolute lymphocyte count. In range of fairly recent labs.  Follow-up with heme/onc.     Virgel Manifold, MD 12/09/15 [phone]

## 2015-12-09 ENCOUNTER — Encounter: Payer: Self-pay | Admitting: *Deleted

## 2015-12-09 ENCOUNTER — Encounter (HOSPITAL_COMMUNITY): Payer: Self-pay

## 2015-12-09 ENCOUNTER — Encounter (HOSPITAL_COMMUNITY): Payer: Self-pay | Admitting: Cardiology

## 2015-12-09 ENCOUNTER — Ambulatory Visit (HOSPITAL_COMMUNITY)
Admit: 2015-12-09 | Discharge: 2015-12-09 | Disposition: A | Discharge status: Home / Self Care | Payer: Self-pay | Source: Ambulatory Visit | Attending: Cardiology | Admitting: Cardiology

## 2015-12-09 VITALS — BP 114/62 | HR 86 | Wt 181.8 lb

## 2015-12-09 DIAGNOSIS — E039 Hypothyroidism, unspecified: Secondary | ICD-10-CM | POA: Insufficient documentation

## 2015-12-09 DIAGNOSIS — I110 Hypertensive heart disease with heart failure: Secondary | ICD-10-CM | POA: Insufficient documentation

## 2015-12-09 DIAGNOSIS — Z79899 Other long term (current) drug therapy: Secondary | ICD-10-CM | POA: Insufficient documentation

## 2015-12-09 DIAGNOSIS — I5021 Acute systolic (congestive) heart failure: Secondary | ICD-10-CM

## 2015-12-09 DIAGNOSIS — C9110 Chronic lymphocytic leukemia of B-cell type not having achieved remission: Secondary | ICD-10-CM | POA: Insufficient documentation

## 2015-12-09 DIAGNOSIS — Z888 Allergy status to other drugs, medicaments and biological substances status: Secondary | ICD-10-CM | POA: Insufficient documentation

## 2015-12-09 DIAGNOSIS — E785 Hyperlipidemia, unspecified: Secondary | ICD-10-CM | POA: Insufficient documentation

## 2015-12-09 DIAGNOSIS — I5022 Chronic systolic (congestive) heart failure: Secondary | ICD-10-CM | POA: Insufficient documentation

## 2015-12-09 DIAGNOSIS — I428 Other cardiomyopathies: Secondary | ICD-10-CM | POA: Insufficient documentation

## 2015-12-09 DIAGNOSIS — Z8249 Family history of ischemic heart disease and other diseases of the circulatory system: Secondary | ICD-10-CM | POA: Insufficient documentation

## 2015-12-09 MED ORDER — CARVEDILOL 12.5 MG PO TABS
12.5000 mg | ORAL_TABLET | Freq: Two times a day (BID) | ORAL | Status: DC
Start: 2015-12-09 — End: 2016-01-07

## 2015-12-09 NOTE — Patient Instructions (Signed)
INCREASE Coreg to 12.5 mg ,one tab twice a day  Your physician has requested that you have a cardiac catheterization. Cardiac catheterization is used to diagnose and/or treat various heart conditions. Doctors may recommend this procedure for a number of different reasons. The most common reason is to evaluate chest pain. Chest pain can be a symptom of coronary artery disease (CAD), and cardiac catheterization can show whether plaque is narrowing or blocking your heart's arteries. This procedure is also used to evaluate the valves, as well as measure the blood flow and oxygen levels in different parts of your heart. For further information please visit [email or web address]. Please follow instruction sheet, as given.  Your physician recommends that you schedule a follow-up with Hematology/Oncology Physician   Your physician recommends that you schedule a follow-up appointment in: 6 weeks

## 2015-12-09 NOTE — Progress Notes (Signed)
Advanced Heart Failure Medication Review by a Pharmacist  Does the patient  feel that his/her medications are working for him/her?  yes  Has the patient been experiencing any side effects to the medications prescribed?  no  Does the patient measure his/her own blood pressure or blood glucose at home?  no   Does the patient have any problems obtaining medications due to transportation or finances?   no  Understanding of regimen: good Understanding of indications: good Potential of compliance: good Patient understands to avoid NSAIDs. Patient understands to avoid decongestants.  Issues to address at subsequent visits: None   Pharmacist comments:  Katherine Terrell is a pleasant 55 yo spanish-speaking F presenting with her medication bottles. She reports good compliance with her regimen but I did note that some of her bottles were out of date and she was using lisinopril 20 mg instead of 5 mg and carvedilol 6.25 mg instead of 3.125 mg. All discrepancies were reconciled.   Ruta Hinds. Velva Harman, PharmD, BCPS, CPP Clinical Pharmacist Pager: [phone] Phone: [phone] 12/09/2015 9:56 AM      Time with patient: 10 minutes Preparation and documentation time: 2 minutes Total time: 12 minutes

## 2015-12-10 ENCOUNTER — Encounter: Payer: Self-pay | Admitting: *Deleted

## 2015-12-13 ENCOUNTER — Encounter (HOSPITAL_COMMUNITY)
Admission: RE | Disposition: A | Discharge status: Home / Self Care | Payer: Self-pay | Source: Ambulatory Visit | Attending: Cardiology

## 2015-12-13 ENCOUNTER — Other Ambulatory Visit (HOSPITAL_COMMUNITY): Payer: Self-pay | Admitting: *Deleted

## 2015-12-13 ENCOUNTER — Encounter (HOSPITAL_COMMUNITY): Payer: Self-pay | Admitting: Cardiology

## 2015-12-13 ENCOUNTER — Ambulatory Visit (HOSPITAL_COMMUNITY)
Admission: RE | Admit: 2015-12-13 | Discharge: 2015-12-13 | Disposition: A | Discharge status: Home / Self Care | Payer: MEDICAID | Source: Ambulatory Visit | Attending: Cardiology | Admitting: Cardiology

## 2015-12-13 DIAGNOSIS — E785 Hyperlipidemia, unspecified: Secondary | ICD-10-CM | POA: Insufficient documentation

## 2015-12-13 DIAGNOSIS — I110 Hypertensive heart disease with heart failure: Secondary | ICD-10-CM | POA: Insufficient documentation

## 2015-12-13 DIAGNOSIS — I429 Cardiomyopathy, unspecified: Secondary | ICD-10-CM

## 2015-12-13 DIAGNOSIS — I493 Ventricular premature depolarization: Secondary | ICD-10-CM | POA: Insufficient documentation

## 2015-12-13 DIAGNOSIS — E039 Hypothyroidism, unspecified: Secondary | ICD-10-CM | POA: Insufficient documentation

## 2015-12-13 DIAGNOSIS — I5022 Chronic systolic (congestive) heart failure: Secondary | ICD-10-CM | POA: Insufficient documentation

## 2015-12-13 DIAGNOSIS — E042 Nontoxic multinodular goiter: Secondary | ICD-10-CM | POA: Insufficient documentation

## 2015-12-13 DIAGNOSIS — I272 Other secondary pulmonary hypertension: Secondary | ICD-10-CM | POA: Insufficient documentation

## 2015-12-13 DIAGNOSIS — C9110 Chronic lymphocytic leukemia of B-cell type not having achieved remission: Secondary | ICD-10-CM | POA: Insufficient documentation

## 2015-12-13 DIAGNOSIS — Z8249 Family history of ischemic heart disease and other diseases of the circulatory system: Secondary | ICD-10-CM | POA: Insufficient documentation

## 2015-12-13 HISTORY — PX: CARDIAC CATHETERIZATION: SHX172

## 2015-12-13 LAB — PROTIME-INR
INR: 1.11 (ref 0.00–1.49)
PROTHROMBIN TIME: 14.5 s (ref 11.6–15.2)

## 2015-12-13 SURGERY — LEFT HEART CATH AND CORONARY ANGIOGRAPHY

## 2015-12-13 MED ORDER — ASPIRIN 81 MG PO CHEW
81.0000 mg | CHEWABLE_TABLET | ORAL | Status: AC
  Administered 2015-12-13: 81 mg via ORAL

## 2015-12-13 MED ORDER — MIDAZOLAM HCL 2 MG/2ML IJ SOLN
INTRAMUSCULAR | Status: AC
  Filled 2015-12-13: qty 2

## 2015-12-13 MED ORDER — SODIUM CHLORIDE 0.9% FLUSH: 3.0000 mL | Freq: Two times a day (BID) | INTRAVENOUS | Status: DC

## 2015-12-13 MED ORDER — SODIUM CHLORIDE 0.9 % IV SOLN
INTRAVENOUS | Status: DC
  Administered 2015-12-13: 08:00:00 via INTRAVENOUS

## 2015-12-13 MED ORDER — IOHEXOL 350 MG/ML SOLN
INTRAVENOUS | Status: DC | PRN
Start: 2015-12-13 — End: 2015-12-13
  Administered 2015-12-13: 125 mL via INTRA_ARTERIAL

## 2015-12-13 MED ORDER — SODIUM CHLORIDE 0.9 % IV SOLN: 250.0000 mL | INTRAVENOUS | Status: DC | PRN

## 2015-12-13 MED ORDER — ONDANSETRON HCL 4 MG/2ML IJ SOLN: 4.0000 mg | Freq: Four times a day (QID) | INTRAMUSCULAR | Status: DC | PRN

## 2015-12-13 MED ORDER — SODIUM CHLORIDE 0.9% FLUSH: 3.0000 mL | INTRAVENOUS | Status: DC | PRN

## 2015-12-13 MED ORDER — ASPIRIN 81 MG PO CHEW
CHEWABLE_TABLET | ORAL | Status: AC
  Filled 2015-12-13: qty 1

## 2015-12-13 MED ORDER — HEPARIN SODIUM (PORCINE) 1000 UNIT/ML IJ SOLN
INTRAMUSCULAR | Status: AC
  Filled 2015-12-13: qty 1

## 2015-12-13 MED ORDER — HEPARIN SODIUM (PORCINE) 1000 UNIT/ML IJ SOLN
INTRAMUSCULAR | Status: DC | PRN
  Administered 2015-12-13: 4000 [IU] via INTRAVENOUS

## 2015-12-13 MED ORDER — SODIUM CHLORIDE 0.9 % WEIGHT BASED INFUSION: 1.0000 mL/kg/h | INTRAVENOUS | Status: DC

## 2015-12-13 MED ORDER — VERAPAMIL HCL 2.5 MG/ML IV SOLN
INTRAVENOUS | Status: AC
  Filled 2015-12-13: qty 2

## 2015-12-13 MED ORDER — VERAPAMIL HCL 2.5 MG/ML IV SOLN
INTRAVENOUS | Status: DC | PRN
  Administered 2015-12-13: 10 mL via INTRA_ARTERIAL

## 2015-12-13 MED ORDER — FENTANYL CITRATE (PF) 100 MCG/2ML IJ SOLN
INTRAMUSCULAR | Status: DC | PRN
  Administered 2015-12-13 (×3): 25 ug via INTRAVENOUS

## 2015-12-13 MED ORDER — FENTANYL CITRATE (PF) 100 MCG/2ML IJ SOLN
INTRAMUSCULAR | Status: AC
  Filled 2015-12-13: qty 2

## 2015-12-13 MED ORDER — LIDOCAINE HCL (PF) 1 % IJ SOLN
INTRAMUSCULAR | Status: AC
  Filled 2015-12-13: qty 30

## 2015-12-13 MED ORDER — ACETAMINOPHEN 325 MG PO TABS: 650.0000 mg | ORAL_TABLET | ORAL | Status: DC | PRN

## 2015-12-13 MED ORDER — HEPARIN (PORCINE) IN NACL 2-0.9 UNIT/ML-% IJ SOLN
INTRAMUSCULAR | Status: DC | PRN
  Administered 2015-12-13: 1500 mL via INTRA_ARTERIAL

## 2015-12-13 MED ORDER — MIDAZOLAM HCL 2 MG/2ML IJ SOLN
INTRAMUSCULAR | Status: DC | PRN
  Administered 2015-12-13 (×2): 1 mg via INTRAVENOUS

## 2015-12-13 MED ORDER — LIDOCAINE HCL (PF) 1 % IJ SOLN
INTRAMUSCULAR | Status: DC | PRN
Start: 2015-12-13 — End: 2015-12-13
  Administered 2015-12-13: 2 mL via SUBCUTANEOUS

## 2015-12-13 SURGICAL SUPPLY — 11 items

## 2015-12-13 NOTE — Progress Notes (Signed)
Interpreter Lesle Chris for Short Stay

## 2015-12-13 NOTE — Interval H&P Note (Signed)
Cath Lab Visit (complete for each Cath Lab visit)  Clinical Evaluation Leading to the Procedure:   ACS: No.  Non-ACS:    Anginal Classification: CCS III  Anti-ischemic medical therapy: Minimal Therapy (1 class of medications)  Non-Invasive Test Results: No non-invasive testing performed  Prior CABG: No previous CABG      History and Physical Interval Note:  12/13/2015 9:21 AM  Katherine Terrell  has presented today for surgery, with the diagnosis of chf  The various methods of treatment have been discussed with the patient and family. After consideration of risks, benefits and other options for treatment, the patient has consented to  Procedure(s): Left Heart Cath and Coronary Angiography (N/A) as a surgical intervention .  The patient's history has been reviewed, patient examined, no change in status, stable for surgery.  I have reviewed the patient's chart and labs.  Questions were answered to the patient's satisfaction.     Katherine Terrell [employer]

## 2015-12-13 NOTE — H&P (View-Only) (Signed)
Patient ID: Katherine Terrell, female   DOB: Oct 30, 1961, 55 y.o.   MRN: [id number]    Advanced Heart Failure Clinic Note   Primary Care: None Primary Cardiologist: Dr Aundra Dubin   HPI:  55 year old female with history of HTN, CLL, NICM with EF that initially improved to 50-55% in 2012 with med adjustment, and hypothyroidism. Previously followed by Dr Aundra Dubin but had not followed up with cardiology since 2012 prior to admission in 10/2015.  Admitted 11/27/15 with influenza B and had been placed on Tamiflu by PCP but had not yet started. CXR with LLL infiltrate and sp02 89-91%. EKG with ST and PVCs and couplets. In the ER her BP dropped from [id number] systolic to the [id number]. IV fluids given. Pt admitted. Echo showed EF 30-35% diffuse hypokinesis. Mild AS, trivial regurg. LA severely dilated. Mod MR, RA was moderately dilated and moderate TR. PA pk pressure 56 mm HG- mod pul HTN. EF is down from 50-55% in 2012.  Of note, she has hx of cardiomyopathy 08/2010 during hospitalization for "aypical PNA" with EF 25-30% unknown etiology. Cardiac cath performed by Dr. Aundra Dubin 10/2010 with normal coronary arteries. Left ventriculogram with cath and echo in 11/2010 showed Repeat echo after cath with EF 50-55%.  She reports today for post hospital follow up with newly reduced EF (recurrent). States 3-4 days prior to hospitalization she was having SOB and chest tightness with coughing only.  She denies DOE, no problem walking on flat grounds. No problems with stairs. She hasn't had chest tightness since Saturday, and it was only with coughing. Not related to activity. Denies SOB or DOE, again, only with coughing episodes.  Denies edema.  She is not currently lightheadedness or dizziness. She was having dizziness prior to her hospitalization, but this has improved. She is not currently being treated for her CLL. Is seen every 4-6 months. Last visit 10/07/15.  Labs (11/11): HIV negative, ANA negative, K 3.9, creatinine 1.12, BNP  135 Labs (1/12): LDL 98, HDL 38, BNP 27, K 4.3, creatinine 1.2 Labs (2/12): TSH normal Labs (12/08/15) K 4.8, Cr 1.09  ECG: NSR, LVH with repolarization abnormality.   PMH 1. Chronic lymphocytic leukemia: Follows with Dr Alen Blew. Mild hepatosplenomegaly.  No treatment yet.  2. Hyperlipidemia 3. Hypertension  4. Multinodular goiter with hypothyroidism 5. Cardiomyopathy, NICM: Echo (11/11) EF 25-30% with diffuse hypokinesis, mild left atrial enlargement. 11/11 HIV and ANA negative. TSH normal. LHC (1/12): EF 50-55%, no angiographic CAD. Echo (2/12): EF 50-55%, moderate LV hypertrophy, grade I diastolic dysfunction, mild MR, normal RV.  - With recent admission 1/17 Echo showed EF down again to 30-35%, mild aortic stenosis, moderate TR, PASP 56 mmHg.  6. Aortic stenosis: Mild on 1/17 echo.   ROS: All systems reviewed and negative except as per HPI.   Current Outpatient Prescriptions  Medication Sig Dispense Refill  . acetaminophen (TYLENOL) 325 MG tablet Take 650 mg by mouth every 6 (six) hours as needed for moderate pain or headache.     . albuterol (PROVENTIL) (2.5 MG/3ML) 0.083% nebulizer solution Take 3 mLs (2.5 mg total) by nebulization every 4 (four) hours as needed for wheezing or shortness of breath. 75 mL 3  . carvedilol (COREG) 6.25 MG tablet Take 6.25 mg by mouth 2 (two) times daily with a meal.    . dextromethorphan-guaiFENesin (MUCINEX DM) 30-600 MG 12hr tablet Take 1 tablet by mouth 2 (two) times daily. 60 tablet 0  . furosemide (LASIX) 40 MG tablet Take 1 tablet (40 mg  total) by mouth daily. 30 tablet 1  . levothyroxine (SYNTHROID, LEVOTHROID) 50 MCG tablet Take 50 mcg by mouth daily before breakfast.    . lisinopril (PRINIVIL,ZESTRIL) 20 MG tablet Take 20 mg by mouth daily.    . pantoprazole (PROTONIX) 40 MG tablet Take 1 tablet (40 mg total) by mouth daily. 30 tablet 0   No current facility-administered medications for this encounter.    Allergies  Allergen Reactions   . Tramadol Nausea And Vomiting      Social History   Social History  . Marital Status: Single    Spouse Name: N/A  . Number of Children: N/A  . Years of Education: N/A   Occupational History  . Works in [employer].     Social History Main Topics  . Smoking status: Never Smoker   . Smokeless tobacco: Never Used  . Alcohol Use: No  . Drug Use: No  . Sexual Activity: Not Currently    Birth Control/ Protection: None   Other Topics Concern  . Not on file   Social History Narrative   From [place], Lives in [place]. Speaks minimal English.       Family History  Problem Relation Age of Onset  . Heart attack Mother 40    Filed Vitals:   12/09/15 0925  BP: 114/62  Pulse: 86  Weight: 181 lb 12.8 oz (82.464 kg)  SpO2: 96%    PHYSICAL EXAM: General:  Well appearing. NAD HEENT: normal Neck: supple. JVD 6-7 cm. Carotids 2+ bilat; no bruits. No thryomegaly or nodule noted. Cor: PMI nondisplaced. RRR No rubs, gallops or murmurs. Lungs: mild coarse basilar sounds that clear with cough. Normal effort Abdomen: soft, NT, ND, no HSM. No bruits or masses. +BS  Extremities: no cyanosis, clubbing, rash, edema Neuro: alert & oriented x 3, cranial nerves grossly intact. moves all 4 extremities w/o difficulty. Affect pleasant.  ECG:  NSR 70, LDH, Incomplete LBBB  ASSESSMENT & PLAN:  1. Chronic systolic HF - EF 99991111 down from 50-55% in 2012 - Normal coronaries 2012. Needs new ischemic work up.  - had labs drawn yesterday, non needed today.  Depending on timing of cath may need pre-cath labs redrawn. - Last fall in EF thought to be episode of myocarditis with rapid improvement.  - Increase Coreg to 12.5 mg to BID - Continue lisinopril 20 mg daily.  2. CLL - Per IM recent admission stable, despite WBC in 60s - Up in 100s most recent check.   - Per last heme onc note "rapid doubling" is a worrisome symptom and should prompt follow up.  - Encouraged patient multiple time to  make follow up with Dr Alen Blew. 3.  HTN - Stable on current meds. Has some room. - Increase coreg as above. Continue lisinopril 4. HLD - Total 157, LDL 104, HDL, 30. TG 116. 11/29/15 - Will await cath for need for statin   LHC scheduled for next week. Follow up 6 weeks with Dr. Aundra Dubin. Had labs yesterday.  Legrand Como "Jonni Sanger" Chalmers Cater, PA-C 12/09/2015   Patient seen with PA, agree with the above note.  She was recently admitted with influenza B and evidence for PNA.  She was very short of breath.  Echo was done, showing that EF had fallen to 30-35%.  She is doing better now, still very fatigued but dyspnea has mostly resolved.    New fall in EF is concerning. 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  However, I do agree that coronary angiography would be warranted to rule out CAD/ischemic cardiomyopathy.  She does have some risk factors, including HTN and hyperlipidemia.  I discussed the risks/benefits of the procedure today via an interpreter.  She agrees to proceed.  We will try to contact her family as well.   Increase Coreg to 12.5 mg bid, continue lisinopril.  Will add spironolactone as next step in future.   WBCs higher than her baseline, she will need followup with Dr. Alen Blew.  Loralie Champagne 12/09/2015 5:15 PM

## 2015-12-13 NOTE — Discharge Instructions (Signed)
Low sodium diet.  Angiografa transradial  (Transradial Angiography) La angiografa es un procedimiento que se [place] para examinar los vasos sanguneos. Los vasos sanguneos son tubos que transportan la sangre a diferentes partes del cuerpo. Durante la angiografa, se inyecta una sustancia de [place] a travs de un tubo largo y delgado (catter) en una arteria. Una arteria es un tipo de vaso sanguneo que lleva la sangre desde el corazn a un rgano o a otras partes del cuerpo. La angiografa transradial se refiere al uso de la arteria radial, situada en la [place], como el sitio de Paramedic o donde se Doctor, hospital. La punta del catter se inserta desde all a los vasos sanguneos que el profesional debe estudiar, como los vasos que irrigan el corazn, el cerebro, las piernas, o a otros rganos.Despus que se inyecta la sustancia de [place], se toman las radiografas. Las radiografas muestran si existen alteraciones, como una obstruccin en un vaso sanguneo.  El procedimiento se Science writer hospital y tarda unos 30 minutos en completarse, aunque puede tomar ms [employer].  INFORME A SU MDICO SOBRE:   Alergias, incluyendo alergias a ciertas sustancias de contraste.   Medicamentos que [place], incluyendo vitaminas, hierbas, gotas oftlmicas, medicamentos de venta libre y cremas. Adems, dgale a su mdico si est tomando aspirina u otros medicamentos que puedan afectar a la coagulacin de Herbalist.   Uso de corticoides (por va oral o cremas).   Problemas anteriores debido a anestsicos o a otros medicamentos que [employer] sensibilidad.   Antecedentes de hemorragias o cogulos sanguneos.   Si es o ha sido fumador.   Cirugas anteriores.   Otros problemas de salud, incluyendo diabetes y problemas renales.   Fiebres o infecciones recientes.  Posible embarazo.  RIESGOS Y COMPLICACIONES  Al igual que con cualquier procedimiento, pueden aparecer complicaciones, pero por lo  general pueden ser controlados por el mdico. Las complicaciones pueden ser:   Reaccin a los anestsicos.   Lesiones en los nervios, tejidos o estructuras circundantes.   Infeccin.   Cogulos sanguneos.   Formacin de cicatrices.  Tambin pueden presentarse las siguientes complicaciones, pero son poco frecuentes:   Prdida de gran cantidad de [place].   El flujo de [place] a travs de la arteria radial se hace ms lento o se detiene.   El mdico no puede Pension scheme manager o usar la arteria radial.   El procedimiento no funciona. ANTES DEL PROCEDIMIENTO   Puede que tenga que someterse a un examen fsico. Tambin puede ser necesario hacer algunos estudios, como anlisis de [place], control de la presin arterial o diagnsticos por imgenes.   Consulte a su mdico si debe cambiar o suspender los medicamentos que toma habitualmente.  Evite usar mucho [place], joyas y accesorios para el [place] procedimiento.   Evite fumar por lo menos 24 horas antes del procedimiento.  Haga arreglos para que alguien la lleve a su casa despus del procedimiento.  No debe comer ni beber durante las 8 horas previas al procedimiento. Consulte con su mdico si puede tomar los [employer] con un sorbo de [place]. PROCEDIMIENTO   Se le dar un medicamento llamado anestesia local, para adormecer la zona de la Experiment. Tambin le darn un medicamento que lo ayudar a Nurse, children's (sedante) antes y durante del procedimiento, a travs de un acceso intravenoso (IV) en la mano o en el brazo.  La zona en la mueca en la que se inserta un tubo flexible (catter) se lava y se afeita.  Se le insertar una pequea aguja a travs de la piel hacia la arteria radial y se introduce un alambre de gua. Luego se inserta el catter [employer] y se avanza a la posicin deseada utilizando un tipo de procedimiento de radiografa llamada fluoroscopa.  Luego se inyecta la sustancia de [place] y se toman  las radiografas. Las Health visitor cuando se encuentra un estrechamiento o una obstruccin en los vasos sanguneos.  Al final del procedimiento, se aplica a la mueca un dispositivo de compresin para prevenir el sangrado. DESPUS DEL PROCEDIMIENTO  Tendr que mantener la mueca inmvil hasta que su mdico lo autorice para moverse. Por lo general son unas pocas horas luego del final del procedimiento. Mientras se recupera, el profesional puede:   Controlarlo con frecuencia.   Poco a poco disminuir la presin en el dispositivo de compresin.   Tomar exmenes de [place], [place] radiografas y Radiation protection practitioner. Es posible que pueda volver a casa el mismo da o Art gallery manager en observacin en el hospital durante la noche. Su mdico decidir si es necesario. Es posible que sienta un poco de dolor y tenga moretones en la zona de la incisin. Tambin puede sentir un leve de hormigueo en la mano. Esto es normal y Estate manager/land agent a Armed forces operational officer.    Esta informacin no tiene Marine scientist el consejo del mdico. Asegrese de hacerle al mdico cualquier pregunta que tenga.   Document Released: 07/10/2012 Elsevier Interactive Patient Education 2016 [place] Refer to this sheet in the next few weeks. These instructions provide you with information about caring for yourself after your procedure. Your health care provider may also give you more specific instructions. Your treatment has been planned according to current medical practices, but problems sometimes occur. Call your health care provider if you have any problems or questions after your procedure. WHAT TO EXPECT AFTER THE PROCEDURE After your procedure, it is typical to have the following:  Bruising at the radial site that usually fades within 1-2 weeks.  Blood collecting in the tissue (hematoma) that may be painful to the touch. It should usually decrease in size and tenderness within  1-2 weeks. HOME CARE INSTRUCTIONS  Take medicines only as directed by your health care provider.  You may shower 24-48 hours after the procedure or as directed by your health care provider. Remove the bandage (dressing) and gently wash the site with plain soap and water. Pat the area dry with a clean towel. Do not rub the site, because this may cause bleeding.  Do not take baths, swim, or use a hot tub until your health care provider approves.  Check your insertion site every day for redness, swelling, or drainage.  Do not apply powder or lotion to the site.  Do not flex or bend the affected arm for 24 hours or as directed by your health care provider.  Do not push or pull heavy objects with the affected arm for 24 hours or as directed by your health care provider.  Do not lift over 10 lb (4.5 kg) for 5 days after your procedure or as directed by your health care provider.  Ask your health care provider when it is okay to:  Return to work or school.  Resume usual physical activities or sports.  Resume sexual activity.  Do not drive home if you are discharged the same day as the procedure. Have someone else drive you.  You may drive 24 hours  after the procedure unless otherwise instructed by your health care provider.  Do not operate machinery or power tools for 24 hours after the procedure.  If your procedure was done as an outpatient procedure, which means that you went home the same day as your procedure, a responsible adult should be with you for the first 24 hours after you arrive home.  Keep all follow-up visits as directed by your health care provider. This is important. SEEK MEDICAL CARE IF:  You have a fever.  You have chills.  You have increased bleeding from the radial site. Hold pressure on the site. CALL 911 SEEK IMMEDIATE MEDICAL CARE IF:  You have unusual pain at the radial site.  You have redness, warmth, or swelling at the radial site.  You have  drainage (other than a small amount of blood on the dressing) from the radial site.  The radial site is bleeding, and the bleeding does not stop after 30 minutes of holding steady pressure on the site.  Your arm or hand becomes pale, cool, tingly, or numb.   This information is not intended to replace advice given to you by your health care provider. Make sure you discuss any questions you have with your health care provider.   Document Released: 11/18/2010 Document Revised: 11/06/2014 Document Reviewed: 05/04/2014 Elsevier Interactive Patient Education [employer].

## 2015-12-20 ENCOUNTER — Other Ambulatory Visit: Payer: Self-pay | Admitting: Obstetrics and Gynecology

## 2015-12-20 DIAGNOSIS — Z1231 Encounter for screening mammogram for malignant neoplasm of breast: Secondary | ICD-10-CM

## 2015-12-23 ENCOUNTER — Encounter (HOSPITAL_COMMUNITY): Payer: Self-pay

## 2015-12-23 ENCOUNTER — Ambulatory Visit (HOSPITAL_COMMUNITY)
Admission: RE | Admit: 2015-12-23 | Discharge: 2015-12-23 | Disposition: A | Discharge status: Home / Self Care | Payer: Self-pay | Source: Ambulatory Visit | Attending: Obstetrics and Gynecology | Admitting: Obstetrics and Gynecology

## 2015-12-23 ENCOUNTER — Ambulatory Visit
Admission: RE | Admit: 2015-12-23 | Discharge: 2015-12-23 | Disposition: A | Discharge status: Home / Self Care | Payer: No Typology Code available for payment source | Source: Ambulatory Visit | Attending: Obstetrics and Gynecology | Admitting: Obstetrics and Gynecology

## 2015-12-23 VITALS — BP 108/68 | Temp 97.6°F | Ht 64.0 in | Wt 181.0 lb

## 2015-12-23 DIAGNOSIS — Z1239 Encounter for other screening for malignant neoplasm of breast: Secondary | ICD-10-CM

## 2015-12-23 DIAGNOSIS — Z1231 Encounter for screening mammogram for malignant neoplasm of breast: Secondary | ICD-10-CM

## 2015-12-23 NOTE — Addendum Note (Signed)
Encounter addended by: Loletta Parish, RN on: 12/23/2015  1:18 PM<BR>     Documentation filed: Notes Section

## 2015-12-23 NOTE — Progress Notes (Addendum)
Complaints of mild breast pain that occurs once in awhile  Pap Smear: Pap smear not completed today. Pap smear not completed today. Last Pap smear was in October 2014 at [facility] in [place] and normal per patient. Per patient has no history of an abnormal Pap smear. No Pap smear results in EPIC.  Physical exam: Breasts Breasts symmetrical. No skin abnormalities bilateral breasts. No nipple retraction bilateral breasts. No nipple discharge bilateral breasts. No lymphadenopathy. No lumps palpated bilateral breasts. No complaints of pain or tenderness on exam. Referred patient to the [facility] for a screening mammogram. Appointment scheduled for Thursday, December 23, 2015 at 1150.  Pelvic/Bimanual No Pap smear completed today since last Pap smear was in October 2014. Pap smear not indicated per BCCCP guidelines.   Smoking History: Patient has never smoked.  Patient Navigation: Patient education provided. Access to services provided for patient through [facility] program. Spanish interpreter provided.   Colorectal Cancer Screening: Patient has no history of having a colonoscopy. Patient stated will follow up with her PCP for referral. No complaints today.  Used Spanish interpreter [employer].

## 2015-12-23 NOTE — Patient Instructions (Addendum)
Educational materials on self breast awareness given. Explained to Katherine Terrell that she did not need a Pap smear today due to last Pap smear was in October 2014 per patient. Let her know BCCCP will cover Pap smears every 3 years unless has a history of abnormal Pap smears. Reminded patient that her next Pap smear is due in October 2017 and to call Gabriel Cirri to schedule with BCCCP. Referred patient to the [facility] for a screening mammogram. Appointment scheduled for Thursday, December 23, 2015 at 1150. Patient aware of appointment and will be there. Let patient know the [facility] will follow up with her within the next couple weeks with results of mammogram by letter or phone. Katherine Terrell verbalized understanding.  [name], [name], RN 10:49 AM

## 2015-12-24 ENCOUNTER — Other Ambulatory Visit (HOSPITAL_COMMUNITY): Payer: Self-pay | Admitting: Cardiology

## 2015-12-29 ENCOUNTER — Telehealth: Payer: Self-pay

## 2015-12-29 NOTE — Telephone Encounter (Signed)
Called per interpreter Anastasio Auerbach to see if interested in [facility]. Left message. Patient returned call and stated that had a PCP and no transportation to get to [place]. Patient stated that had an appointment soon with PCP and would get him to check HGB A1C and cholesterol. Not interest in [facility] right now.

## 2016-01-07 ENCOUNTER — Other Ambulatory Visit (HOSPITAL_COMMUNITY): Payer: Self-pay | Admitting: Cardiology

## 2016-01-07 DIAGNOSIS — I5021 Acute systolic (congestive) heart failure: Secondary | ICD-10-CM

## 2016-01-07 MED ORDER — LISINOPRIL 20 MG PO TABS
20.0000 mg | ORAL_TABLET | Freq: Every day | ORAL | Status: DC
Start: 2016-01-07 — End: 2016-06-04

## 2016-01-07 MED ORDER — CARVEDILOL 12.5 MG PO TABS: 12.5000 mg | ORAL_TABLET | Freq: Two times a day (BID) | ORAL | Status: DC

## 2016-01-20 ENCOUNTER — Encounter (HOSPITAL_COMMUNITY): Payer: Self-pay

## 2016-02-02 ENCOUNTER — Ambulatory Visit: Payer: Self-pay | Admitting: Oncology

## 2016-02-02 ENCOUNTER — Other Ambulatory Visit: Payer: Self-pay

## 2016-02-04 ENCOUNTER — Encounter (HOSPITAL_COMMUNITY): Payer: Self-pay | Admitting: Cardiology

## 2016-02-04 ENCOUNTER — Ambulatory Visit (HOSPITAL_COMMUNITY)
Admission: RE | Admit: 2016-02-04 | Discharge: 2016-02-04 | Disposition: A | Discharge status: Home / Self Care | Payer: No Typology Code available for payment source | Source: Ambulatory Visit | Attending: Cardiology | Admitting: Cardiology

## 2016-02-04 VITALS — BP 138/74 | HR 86 | Wt 182.5 lb

## 2016-02-04 DIAGNOSIS — I5022 Chronic systolic (congestive) heart failure: Secondary | ICD-10-CM

## 2016-02-04 DIAGNOSIS — I110 Hypertensive heart disease with heart failure: Secondary | ICD-10-CM | POA: Insufficient documentation

## 2016-02-04 DIAGNOSIS — Z79899 Other long term (current) drug therapy: Secondary | ICD-10-CM | POA: Insufficient documentation

## 2016-02-04 DIAGNOSIS — E039 Hypothyroidism, unspecified: Secondary | ICD-10-CM | POA: Insufficient documentation

## 2016-02-04 DIAGNOSIS — C9110 Chronic lymphocytic leukemia of B-cell type not having achieved remission: Secondary | ICD-10-CM | POA: Insufficient documentation

## 2016-02-04 DIAGNOSIS — Z888 Allergy status to other drugs, medicaments and biological substances status: Secondary | ICD-10-CM | POA: Insufficient documentation

## 2016-02-04 DIAGNOSIS — Z8249 Family history of ischemic heart disease and other diseases of the circulatory system: Secondary | ICD-10-CM | POA: Insufficient documentation

## 2016-02-04 DIAGNOSIS — I428 Other cardiomyopathies: Secondary | ICD-10-CM | POA: Insufficient documentation

## 2016-02-04 DIAGNOSIS — I5021 Acute systolic (congestive) heart failure: Secondary | ICD-10-CM

## 2016-02-04 DIAGNOSIS — R42 Dizziness and giddiness: Secondary | ICD-10-CM | POA: Insufficient documentation

## 2016-02-04 DIAGNOSIS — Z833 Family history of diabetes mellitus: Secondary | ICD-10-CM | POA: Insufficient documentation

## 2016-02-04 DIAGNOSIS — E785 Hyperlipidemia, unspecified: Secondary | ICD-10-CM | POA: Insufficient documentation

## 2016-02-04 MED ORDER — LEVOTHYROXINE SODIUM 50 MCG PO TABS: 50.0000 ug | ORAL_TABLET | Freq: Every day | ORAL | Status: DC

## 2016-02-04 MED ORDER — FUROSEMIDE 20 MG PO TABS: 20.0000 mg | ORAL_TABLET | Freq: Every day | ORAL | Status: DC

## 2016-02-04 MED ORDER — LEVOTHYROXINE SODIUM 50 MCG PO TABS: 50.0000 ug | ORAL_TABLET | Freq: Every day | ORAL | Status: AC

## 2016-02-04 NOTE — Progress Notes (Signed)
Advanced Heart Failure Medication Review by a Pharmacist  Does the patient  feel that his/her medications are working for him/her?  yes  Has the patient been experiencing any side effects to the medications prescribed?  no  Does the patient measure his/her own blood pressure or blood glucose at home?  no   Does the patient have any problems obtaining medications due to transportation or finances?   no  Understanding of regimen: good Understanding of indications: good Potential of compliance: good Patient understands to avoid NSAIDs. Patient understands to avoid decongestants.  Issues to address at subsequent visits: None   Pharmacist comments:  Ms. Mccloskey is a pleasant spanish-speaking 55 yo F presenting with her medication bottles. 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 She reports good compliance with her regimen but does state that she ran out of refills on her levothyroxine today and will be seeing her endocrinologist on Monday. No other medication-related questions or concerns at this time.     Ruta Hinds. Velva Harman, PharmD, BCPS, CPP Clinical Pharmacist Pager: [phone] Phone: [phone] 02/04/2016 9:38 AM     Time with patient: 10 minutes Preparation and documentation time: 2 minutes Total time: 12 minutes

## 2016-02-04 NOTE — Patient Instructions (Signed)
Reduzca la dosis de furosemide a 20 mg diario.   Pide una cita con Dr. Aundra Dubin en 4 meses.

## 2016-02-06 NOTE — Progress Notes (Signed)
Advanced Heart Failure Clinic Note   Primary Care: None Primary Cardiologist: Dr Aundra Dubin   HPI:  55 year old female with history of HTN, CLL, NICM with EF that initially improved to 50-55% in 2012 with med adjustment, and hypothyroidism. Previously followed by Dr Aundra Dubin but had not followed up with cardiology since 2012 prior to admission in 10/2015.  Admitted 11/27/15 with influenza B and had been placed on Tamiflu by PCP but had not yet started. CXR with LLL infiltrate and sp02 89-91%. EKG with ST and PVCs and couplets. In the ER her BP dropped from [id number] systolic to the [id number]. IV fluids given. Pt admitted. Echo showed EF 30-35% diffuse hypokinesis. Mild AS, trivial regurg. LA severely dilated. Mod MR, RA was moderately dilated and moderate TR. PA pk pressure 56 mm HG- mod pul HTN. EF is down from 50-55% in 2012.  Of note, she has hx of cardiomyopathy 08/2010 during hospitalization for "aypical PNA" with EF 25-30% unknown etiology. Cardiac cath performed by Dr. Aundra Dubin 10/2010 with normal coronary arteries. Repeat echo after cath in 2/12 with EF 50-55%.  She was hospitalized in 1/17 with CHF exacerbation.  Echo was done, showing EF back down to 30-35%.  LHC in 2/17 with no CAD, EF seemed a bit better at 45% on LV-gram.  Today she is doing well.  No significant exertional dyspnea.  No orthopnea/PND, ok with climbing steps.  BP is high today but she has not taken any meds.  She sometimes will get dizzy after taking her meds.  Has no insurance as she lost her job.   Labs (11/11): HIV negative, ANA negative, K 3.9, creatinine 1.12, BNP 135 Labs (1/12): LDL 98, HDL 38, BNP 27, K 4.3, creatinine 1.2 Labs (2/12): TSH normal Labs (12/08/15) K 4.8, Cr 1.09  PMH 1. Chronic lymphocytic leukemia: Follows with Dr Alen Blew. Mild hepatosplenomegaly.  No treatment yet.  2. Hyperlipidemia 3. Hypertension  4. Multinodular goiter with hypothyroidism 5. Cardiomyopathy, NICM: Echo (11/11) EF 25-30% with  diffuse hypokinesis, mild left atrial enlargement. 11/11 HIV and ANA negative. TSH normal. LHC (1/12): EF 50-55%, no angiographic CAD. Echo (2/12): EF 50-55%, moderate LV hypertrophy, grade I diastolic dysfunction, mild MR, normal RV.  - With recent admission 1/17 Echo showed EF down again to 30-35%, mild aortic stenosis, moderate TR, PASP 56 mmHg.  - LHC (2/17) with no CAD, EF about 45% on LV-gram.  6. Aortic stenosis: Mild on 1/17 echo.  7. Hypothyroidism  ROS: All systems reviewed and negative except as per HPI.   Current Outpatient Prescriptions  Medication Sig Dispense Refill  . acetaminophen (TYLENOL) 325 MG tablet Take 650 mg by mouth every 6 (six) hours as needed for moderate pain or headache.     . albuterol (PROVENTIL) (2.5 MG/3ML) 0.083% nebulizer solution Take 3 mLs (2.5 mg total) by nebulization every 4 (four) hours as needed for wheezing or shortness of breath. 75 mL 3  . carvedilol (COREG) 12.5 MG tablet Take 1 tablet (12.5 mg total) by mouth 2 (two) times daily with a meal. 60 tablet 3  . furosemide (LASIX) 20 MG tablet Take 1 tablet (20 mg total) by mouth daily. 30 tablet 6  . levothyroxine (SYNTHROID, LEVOTHROID) 50 MCG tablet Take 1 tablet (50 mcg total) by mouth daily before breakfast. 30 tablet 0  . lisinopril (PRINIVIL,ZESTRIL) 20 MG tablet Take 1 tablet (20 mg total) by mouth daily. Reported on 12/23/2015 30 tablet 3   No current facility-administered medications for this  encounter.    Allergies  Allergen Reactions  . Tramadol Nausea And Vomiting     Social History   Social History  . Marital Status: Single    Spouse Name: N/A  . Number of Children: N/A  . Years of Education: N/A   Occupational History  . Works in [employer].     Social History Main Topics  . Smoking status: Never Smoker   . Smokeless tobacco: Never Used  . Alcohol Use: No  . Drug Use: No  . Sexual Activity: Not Currently    Birth Control/ Protection: None   Other Topics Concern  .  Not on file   Social History Narrative   From [place], Lives in [place]. Speaks minimal English.       Family History  Problem Relation Age of Onset  . Heart attack Mother 67  . Diabetes Sister     Danley Danker Vitals:   02/04/16 0920  BP: 138/74  Pulse: 86  Weight: 182 lb 8 oz (82.781 kg)  SpO2: 100%    PHYSICAL EXAM: General:  Well appearing. NAD HEENT: normal Neck: supple. JVD 6-7 cm. Carotids 2+ bilat; no bruits. No thryomegaly or nodule noted. Cor: PMI nondisplaced. RRR No rubs, gallops or murmurs. Lungs: mild coarse basilar sounds that clear with cough. Normal effort Abdomen: soft, NT, ND, no HSM. No bruits or masses. +BS  Extremities: no cyanosis, clubbing, rash, edema Neuro: alert & oriented x 3, cranial nerves grossly intact. moves all 4 extremities w/o difficulty. Affect pleasant.  ASSESSMENT & PLAN:  1. Chronic systolic HF: EF 99991111 on 1/17 echo down from 50-55% in 2012.  LHC in 2/17 showed no coronary disease and LV-gram suggested an improvement in EF to about 45%.  NYHA class I-II symptoms, not volume overloaded on exam.  - Continue lisinopril and Coreg at current doses.  She gets lightheaded after taking her meds so will not titrate.  - Decrease Lasix to 20 mg daily.  - Will get repeat echo given improvement on LV-gram.  2. CLL: Per hematology. 3. HTN: Stable on current meds.  Loralie Champagne 02/06/2016

## 2016-02-08 ENCOUNTER — Other Ambulatory Visit: Payer: Self-pay

## 2016-02-08 ENCOUNTER — Ambulatory Visit: Payer: Self-pay | Admitting: Oncology

## 2016-02-09 ENCOUNTER — Telehealth: Payer: Self-pay | Admitting: Oncology

## 2016-02-09 ENCOUNTER — Other Ambulatory Visit (HOSPITAL_BASED_OUTPATIENT_CLINIC_OR_DEPARTMENT_OTHER): Payer: Self-pay

## 2016-02-09 ENCOUNTER — Ambulatory Visit (HOSPITAL_BASED_OUTPATIENT_CLINIC_OR_DEPARTMENT_OTHER): Payer: Self-pay | Admitting: Oncology

## 2016-02-09 VITALS — BP 133/64 | HR 82 | Temp 98.0°F | Resp 18 | Wt 183.4 lb

## 2016-02-09 DIAGNOSIS — C9110 Chronic lymphocytic leukemia of B-cell type not having achieved remission: Secondary | ICD-10-CM

## 2016-02-09 DIAGNOSIS — I509 Heart failure, unspecified: Secondary | ICD-10-CM

## 2016-02-09 LAB — TECHNOLOGIST REVIEW

## 2016-02-09 LAB — CBC WITH DIFFERENTIAL/PLATELET
BASO%: 0.1 % (ref 0.0–2.0)
Basophils Absolute: 0.1 10*3/uL (ref 0.0–0.1)
EOS ABS: 0.5 10*3/uL (ref 0.0–0.5)
EOS%: 0.4 % (ref 0.0–7.0)
HCT: 34.8 % (ref 34.8–46.6)
HGB: 10.4 g/dL — ABNORMAL LOW (ref 11.6–15.9)
LYMPH%: 94.1 % — AB (ref 14.0–49.7)
MCH: 26.2 pg (ref 25.1–34.0)
MCHC: 30 g/dL — ABNORMAL LOW (ref 31.5–36.0)
MCV: 87.2 fL (ref 79.5–101.0)
MONO#: 2.9 10*3/uL — ABNORMAL HIGH (ref 0.1–0.9)
MONO%: 2.1 % (ref 0.0–14.0)
NEUT%: 3.3 % — ABNORMAL LOW (ref 38.4–76.8)
NEUTROS ABS: 4.5 10*3/uL (ref 1.5–6.5)
Platelets: 146 10*3/uL (ref 145–400)
RBC: 3.99 10*6/uL (ref 3.70–5.45)
RDW: 17 % — ABNORMAL HIGH (ref 11.2–14.5)
WBC: 135.9 10*3/uL — AB (ref 3.9–10.3)
lymph#: 127.8 10*3/uL — ABNORMAL HIGH (ref 0.9–3.3)

## 2016-02-09 LAB — COMPREHENSIVE METABOLIC PANEL
ALBUMIN: 3.7 g/dL (ref 3.5–5.0)
ALT: 11 U/L (ref 0–55)
AST: 23 U/L (ref 5–34)
Alkaline Phosphatase: 112 U/L (ref 40–150)
Anion Gap: 7 mEq/L (ref 3–11)
BUN: 17.7 mg/dL (ref 7.0–26.0)
CALCIUM: 9 mg/dL (ref 8.4–10.4)
CHLORIDE: 107 meq/L (ref 98–109)
CO2: 27 mEq/L (ref 22–29)
CREATININE: 1.3 mg/dL — AB (ref 0.6–1.1)
EGFR: 45 mL/min/{1.73_m2} — ABNORMAL LOW (ref >90)
Glucose: 96 mg/dl (ref 70–140)
Potassium: 3.8 mEq/L (ref 3.5–5.1)
Sodium: 141 mEq/L (ref 136–145)
Total Bilirubin: 0.32 mg/dL (ref 0.20–1.20)
Total Protein: 7.2 g/dL (ref 6.4–8.3)

## 2016-02-09 NOTE — Telephone Encounter (Signed)
per pof to sch pt appt-gave pt copy of avs °

## 2016-02-09 NOTE — Progress Notes (Signed)
Hematology and Oncology Follow Up Visit  Katherine Terrell [id number] [birth year] 55 y.o. 02/09/2016 3:46 PM No PCP Per PatientNo ref. provider found   Principle Diagnosis: 55 year old woman with stage 0 CLL diagnosed in 2011. Presented with lymphocytosis and mild splenomegaly.  Current therapy: Observation and surveillance.  Interim History: Katherine Terrell presents today for a followup visit accompanied by an interpreter today. Since her last visit, she was hospitalized in February 2017. Since her discharge, she has felt better at this time and currently on Lasix. He denied any chest pain or shortness of breath. She denied any lower extremity edema.    She is not reporting any bulky adenopathy, early satiety or change in her bowel habits. No urinary symptoms. She continues to perform activities of daily living without any hindrance or decline. She denied any fevers, chills or sweats. Her weights about same.   She does not report any headaches, blurry vision, syncope or seizures. She does not report any chest pain, palpitation orthopnea. She does not report any cough or hemoptysis. He does not report any nausea, vomiting oral satiety. She does not report any constipation or diarrhea. She does not report any lymphadenopathy or petechiae. Rest of her review of systems unremarkable.  Medications: I have reviewed the patient's current medications.  Current Outpatient Prescriptions  Medication Sig Dispense Refill  . acetaminophen (TYLENOL) 325 MG tablet Take 650 mg by mouth every 6 (six) hours as needed for moderate pain or headache.     . albuterol (PROVENTIL) (2.5 MG/3ML) 0.083% nebulizer solution Take 3 mLs (2.5 mg total) by nebulization every 4 (four) hours as needed for wheezing or shortness of breath. 75 mL 3  . carvedilol (COREG) 12.5 MG tablet Take 1 tablet (12.5 mg total) by mouth 2 (two) times daily with a meal. 60 tablet 3  . furosemide (LASIX) 20 MG tablet Take 1 tablet (20 mg total) by mouth  daily. 30 tablet 6  . levothyroxine (SYNTHROID, LEVOTHROID) 50 MCG tablet Take 1 tablet (50 mcg total) by mouth daily before breakfast. 30 tablet 0  . lisinopril (PRINIVIL,ZESTRIL) 20 MG tablet Take 1 tablet (20 mg total) by mouth daily. Reported on 12/23/2015 30 tablet 3   No current facility-administered medications for this visit.     Allergies:  Allergies  Allergen Reactions  . Tramadol Nausea And Vomiting    Past Medical History, Surgical history, Social history, and Family History were reviewed and updated.   Physical Exam: Blood pressure 133/64, pulse 82, temperature 98 F (36.7 C), resp. rate 18, weight 183 lb 6.4 oz (83.19 kg), SpO2 96 %. ECOG: 0 General appearance: Alert, awake woman without distress. Head: Normocephalic, without obvious abnormality no oral thrush noted. Neck: no adenopathy Lymph nodes: Cervical, supraclavicular, and axillary nodes normal. Heart:regular rate and rhythm, S1, S2 normal, no murmur, click, rub or gallop Lung:chest clear, no wheezing, rales, normal symmetric air entry Abdomin: soft, non-tender, without masses or organomegaly no rebound or guarding. Extremities: No edema noted.    Lab Results: Lab Results  Component Value Date   WBC 135.9* 02/09/2016   HGB 10.4* 02/09/2016   HCT 34.8 02/09/2016   MCV 87.2 02/09/2016   PLT 146 02/09/2016     Chemistry      Component Value Date/Time   NA 138 12/08/2015 1216   NA 143 10/07/2015 1516   K 4.8 12/08/2015 1216   K 3.7 10/07/2015 1516   CL 106 12/08/2015 1216   CL 107 12/24/2012 1332   CO2 23 12/08/2015  1216   CO2 20* 10/07/2015 1516   BUN 15 12/08/2015 1216   BUN 16.3 10/07/2015 1516   CREATININE 1.09* 12/08/2015 1216   CREATININE 1.0 10/07/2015 1516   CREATININE 1.05 02/24/2011 1633      Component Value Date/Time   CALCIUM 9.0 12/08/2015 1216   CALCIUM 9.4 10/07/2015 1516   ALKPHOS 69 11/29/2015 0406   ALKPHOS 119 10/07/2015 1516   AST 27 11/29/2015 0406   AST 18 10/07/2015  1516   ALT 17 11/29/2015 0406   ALT 11 10/07/2015 1516   BILITOT 0.4 11/29/2015 0406   BILITOT 0.66 10/07/2015 1516        Impression and Plan:  55 year old woman with the following issues:  1. Stage 0 CLL presented with a lymphocytosis. She has been diagnosed and followed since 2011 and have not developed any symptomatology that requires intervention.  Her last CT scan obtained in August 2016 showed no lymphadenopathy.  She is clinically asymptomatic at this time I do not see any indication for treatment. These indications were reviewed again including symptomatic lymphadenopathy, constitutional symptoms or cytopenias. Her white cell count increase is very slow and could be also attributed to her recent hospitalization.  I recommended continuing observation and surveillance and a follow-up visit in 4 months.  2. Congestive heart failure: Currently on Lasix and appears to be euvolemic.  3. Followup: Will be in 4 months.  Katherine Button, MD 4/12/20173:46 PM

## 2016-02-09 NOTE — Addendum Note (Signed)
Addended by: Randolm Idol on: 02/09/2016 04:37 PM   Modules accepted: Orders, Medications

## 2016-06-03 ENCOUNTER — Encounter (HOSPITAL_COMMUNITY): Payer: Self-pay

## 2016-06-03 ENCOUNTER — Emergency Department (HOSPITAL_COMMUNITY): Payer: BLUE CROSS/BLUE SHIELD

## 2016-06-03 ENCOUNTER — Observation Stay (HOSPITAL_COMMUNITY)
Admission: EM | Admit: 2016-06-03 | Discharge: 2016-06-04 | Disposition: A | Discharge status: Home / Self Care | Payer: BLUE CROSS/BLUE SHIELD | Attending: Internal Medicine | Admitting: Internal Medicine

## 2016-06-03 DIAGNOSIS — I5043 Acute on chronic combined systolic (congestive) and diastolic (congestive) heart failure: Secondary | ICD-10-CM | POA: Diagnosis not present

## 2016-06-03 DIAGNOSIS — I5021 Acute systolic (congestive) heart failure: Secondary | ICD-10-CM | POA: Diagnosis present

## 2016-06-03 DIAGNOSIS — I509 Heart failure, unspecified: Secondary | ICD-10-CM

## 2016-06-03 DIAGNOSIS — E039 Hypothyroidism, unspecified: Secondary | ICD-10-CM | POA: Insufficient documentation

## 2016-06-03 DIAGNOSIS — I110 Hypertensive heart disease with heart failure: Principal | ICD-10-CM | POA: Insufficient documentation

## 2016-06-03 DIAGNOSIS — C9110 Chronic lymphocytic leukemia of B-cell type not having achieved remission: Secondary | ICD-10-CM | POA: Insufficient documentation

## 2016-06-03 DIAGNOSIS — D649 Anemia, unspecified: Secondary | ICD-10-CM | POA: Insufficient documentation

## 2016-06-03 DIAGNOSIS — I10 Essential (primary) hypertension: Secondary | ICD-10-CM | POA: Diagnosis present

## 2016-06-03 DIAGNOSIS — I4891 Unspecified atrial fibrillation: Secondary | ICD-10-CM | POA: Insufficient documentation

## 2016-06-03 DIAGNOSIS — R0789 Other chest pain: Secondary | ICD-10-CM

## 2016-06-03 DIAGNOSIS — R079 Chest pain, unspecified: Secondary | ICD-10-CM | POA: Diagnosis present

## 2016-06-03 DIAGNOSIS — I5023 Acute on chronic systolic (congestive) heart failure: Secondary | ICD-10-CM

## 2016-06-03 DIAGNOSIS — Z79899 Other long term (current) drug therapy: Secondary | ICD-10-CM | POA: Diagnosis not present

## 2016-06-03 DIAGNOSIS — I493 Ventricular premature depolarization: Secondary | ICD-10-CM | POA: Diagnosis not present

## 2016-06-03 DIAGNOSIS — E7849 Other hyperlipidemia: Secondary | ICD-10-CM | POA: Diagnosis present

## 2016-06-03 LAB — CBC
HCT: 29.1 % — ABNORMAL LOW (ref 36.0–46.0)
HCT: 28.6 % — ABNORMAL LOW (ref 36.0–46.0)
HEMATOCRIT: 29.4 % — AB (ref 36.0–46.0)
HEMOGLOBIN: 9.4 g/dL — AB (ref 12.0–15.0)
Hemoglobin: 9.5 g/dL — ABNORMAL LOW (ref 12.0–15.0)
Hemoglobin: 9.2 g/dL — ABNORMAL LOW (ref 12.0–15.0)
MCH: 27.5 pg (ref 26.0–34.0)
MCH: 27.5 pg (ref 26.0–34.0)
MCH: 27.3 pg (ref 26.0–34.0)
MCHC: 32.3 g/dL (ref 30.0–36.0)
MCHC: 32.3 g/dL (ref 30.0–36.0)
MCHC: 32.2 g/dL (ref 30.0–36.0)
MCV: 85.1 fL (ref 78.0–100.0)
MCV: 85.2 fL (ref 78.0–100.0)
MCV: 84.9 fL (ref 78.0–100.0)
PLATELETS: 155 10*3/uL (ref 150–400)
PLATELETS: 144 10*3/uL — AB (ref 150–400)
RBC: 3.42 MIL/uL — ABNORMAL LOW (ref 3.87–5.11)
RBC: 3.45 MIL/uL — ABNORMAL LOW (ref 3.87–5.11)
RBC: 3.37 MIL/uL — AB (ref 3.87–5.11)
RDW: 15.5 % (ref 11.5–15.5)
RDW: 15.3 % (ref 11.5–15.5)
RDW: 15.2 % (ref 11.5–15.5)
WBC: 110.5 10*3/uL (ref 4.0–10.5)
WBC: 124.8 10*3/uL — AB (ref 4.0–10.5)
WBC: 125.4 10*3/uL — AB (ref 4.0–10.5)

## 2016-06-03 LAB — I-STAT TROPONIN, ED: TROPONIN I, POC: 0.04 ng/mL (ref 0.00–0.08)

## 2016-06-03 LAB — TROPONIN I
TROPONIN I: 0.04 ng/mL — AB (ref <0.03)
TROPONIN I: 0.04 ng/mL — AB (ref <0.03)
TROPONIN I: 0.04 ng/mL — AB (ref <0.03)
Troponin I: 0.04 ng/mL (ref <0.03)

## 2016-06-03 LAB — CREATININE, SERUM
CREATININE: 0.92 mg/dL (ref 0.44–1.00)
GFR calc non Af Amer: >60 mL/min (ref >60)

## 2016-06-03 LAB — BASIC METABOLIC PANEL
ANION GAP: 7 (ref 5–15)
ANION GAP: 5 (ref 5–15)
BUN: 18 mg/dL (ref 6–20)
BUN: 20 mg/dL (ref 6–20)
CALCIUM: 8.3 mg/dL — AB (ref 8.9–10.3)
CALCIUM: 8.3 mg/dL — AB (ref 8.9–10.3)
CO2: 23 mmol/L (ref 22–32)
CO2: 29 mmol/L (ref 22–32)
CREATININE: 1.18 mg/dL — AB (ref 0.44–1.00)
Chloride: 109 mmol/L (ref 101–111)
Chloride: 104 mmol/L (ref 101–111)
Creatinine, Ser: 0.8 mg/dL (ref 0.44–1.00)
GFR calc Af Amer: >60 mL/min (ref >60)
GFR calc Af Amer: 59 mL/min — ABNORMAL LOW (ref >60)
GFR, EST NON AFRICAN AMERICAN: 51 mL/min — AB (ref >60)
GLUCOSE: 117 mg/dL — AB (ref 65–99)
GLUCOSE: 135 mg/dL — AB (ref 65–99)
Potassium: 4.7 mmol/L (ref 3.5–5.1)
Potassium: 4.4 mmol/L (ref 3.5–5.1)
Sodium: 139 mmol/L (ref 135–145)
Sodium: 138 mmol/L (ref 135–145)

## 2016-06-03 LAB — POTASSIUM: POTASSIUM: 3.6 mmol/L (ref 3.5–5.1)

## 2016-06-03 LAB — MAGNESIUM: MAGNESIUM: 2.4 mg/dL (ref 1.7–2.4)

## 2016-06-03 LAB — LIPASE, BLOOD: LIPASE: 14 U/L (ref 11–51)

## 2016-06-03 LAB — BRAIN NATRIURETIC PEPTIDE: B Natriuretic Peptide: 1788.2 pg/mL — ABNORMAL HIGH (ref 0.0–100.0)

## 2016-06-03 MED ORDER — FUROSEMIDE 10 MG/ML IJ SOLN
40.0000 mg | Freq: Every day | INTRAMUSCULAR | Status: DC
  Administered 2016-06-04: 40 mg via INTRAVENOUS
  Filled 2016-06-03: qty 4

## 2016-06-03 MED ORDER — MAGNESIUM SULFATE 2 GM/50ML IV SOLN
2.0000 g | Freq: Once | INTRAVENOUS | Status: AC
  Administered 2016-06-03: 2 g via INTRAVENOUS
  Filled 2016-06-03: qty 50

## 2016-06-03 MED ORDER — ALUM & MAG HYDROXIDE-SIMETH 200-200-20 MG/5ML PO SUSP: 30.0000 mL | ORAL | Status: DC | PRN

## 2016-06-03 MED ORDER — HYDROCORTISONE 1 % EX CREA: 1.0000 "application " | TOPICAL_CREAM | Freq: Three times a day (TID) | CUTANEOUS | Status: DC | PRN

## 2016-06-03 MED ORDER — PANTOPRAZOLE SODIUM 40 MG PO TBEC
40.0000 mg | DELAYED_RELEASE_TABLET | Freq: Two times a day (BID) | ORAL | Status: DC
  Administered 2016-06-03 – 2016-06-04 (×2): 40 mg via ORAL
  Filled 2016-06-03 (×2): qty 1

## 2016-06-03 MED ORDER — LIP MEDEX EX OINT
1.0000 "application " | TOPICAL_OINTMENT | CUTANEOUS | Status: DC | PRN
  Filled 2016-06-03: qty 7

## 2016-06-03 MED ORDER — SALINE SPRAY 0.65 % NA SOLN
1.0000 | NASAL | Status: DC | PRN
  Filled 2016-06-03: qty 44

## 2016-06-03 MED ORDER — GI COCKTAIL ~~LOC~~: 30.0000 mL | Freq: Four times a day (QID) | ORAL | Status: DC | PRN

## 2016-06-03 MED ORDER — FUROSEMIDE 10 MG/ML IJ SOLN
40.0000 mg | Freq: Once | INTRAMUSCULAR | Status: AC
  Administered 2016-06-03: 40 mg via INTRAVENOUS
  Filled 2016-06-03: qty 4

## 2016-06-03 MED ORDER — POTASSIUM CHLORIDE CRYS ER 20 MEQ PO TBCR
40.0000 meq | EXTENDED_RELEASE_TABLET | Freq: Once | ORAL | Status: AC
  Administered 2016-06-03: 40 meq via ORAL
  Filled 2016-06-03: qty 2

## 2016-06-03 MED ORDER — GI COCKTAIL ~~LOC~~
30.0000 mL | Freq: Once | ORAL | Status: AC
  Administered 2016-06-03: 30 mL via ORAL
  Filled 2016-06-03: qty 30

## 2016-06-03 MED ORDER — LORATADINE 10 MG PO TABS: 10.0000 mg | ORAL_TABLET | Freq: Every day | ORAL | Status: DC | PRN

## 2016-06-03 MED ORDER — ACETAMINOPHEN 325 MG PO TABS
650.0000 mg | ORAL_TABLET | ORAL | Status: DC | PRN
  Administered 2016-06-03: 650 mg via ORAL
  Filled 2016-06-03: qty 2

## 2016-06-03 MED ORDER — POLYVINYL ALCOHOL 1.4 % OP SOLN
1.0000 [drp] | OPHTHALMIC | Status: DC | PRN
  Filled 2016-06-03: qty 15

## 2016-06-03 MED ORDER — GUAIFENESIN-DM 100-10 MG/5ML PO SYRP: 5.0000 mL | ORAL_SOLUTION | ORAL | Status: DC | PRN

## 2016-06-03 MED ORDER — LEVOTHYROXINE SODIUM 50 MCG PO TABS
50.0000 ug | ORAL_TABLET | Freq: Every day | ORAL | Status: DC
  Administered 2016-06-04: 50 ug via ORAL
  Filled 2016-06-03: qty 1

## 2016-06-03 MED ORDER — MUSCLE RUB 10-15 % EX CREA: 1.0000 "application " | TOPICAL_CREAM | CUTANEOUS | Status: DC | PRN

## 2016-06-03 MED ORDER — POTASSIUM CHLORIDE CRYS ER 20 MEQ PO TBCR
20.0000 meq | EXTENDED_RELEASE_TABLET | Freq: Every day | ORAL | Status: DC
  Administered 2016-06-03 – 2016-06-04 (×2): 20 meq via ORAL
  Filled 2016-06-03 (×2): qty 1

## 2016-06-03 MED ORDER — MORPHINE SULFATE (PF) 2 MG/ML IV SOLN: 1.0000 mg | INTRAVENOUS | Status: DC | PRN

## 2016-06-03 MED ORDER — ONDANSETRON HCL 4 MG/2ML IJ SOLN: 4.0000 mg | Freq: Four times a day (QID) | INTRAMUSCULAR | Status: DC | PRN

## 2016-06-03 MED ORDER — SODIUM CHLORIDE 0.9 % IV BOLUS (SEPSIS)
1000.0000 mL | Freq: Once | INTRAVENOUS | Status: AC
  Administered 2016-06-03: 1000 mL via INTRAVENOUS

## 2016-06-03 MED ORDER — LISINOPRIL 20 MG PO TABS
20.0000 mg | ORAL_TABLET | Freq: Every day | ORAL | Status: DC
  Administered 2016-06-03: 20 mg via ORAL
  Filled 2016-06-03: qty 1

## 2016-06-03 MED ORDER — ENOXAPARIN SODIUM 40 MG/0.4ML ~~LOC~~ SOLN
40.0000 mg | Freq: Every day | SUBCUTANEOUS | Status: DC
  Administered 2016-06-03 – 2016-06-04 (×2): 40 mg via SUBCUTANEOUS
  Filled 2016-06-03 (×2): qty 0.4

## 2016-06-03 MED ORDER — ASPIRIN EC 325 MG PO TBEC
325.0000 mg | DELAYED_RELEASE_TABLET | Freq: Every day | ORAL | Status: DC
  Administered 2016-06-03 – 2016-06-04 (×2): 325 mg via ORAL
  Filled 2016-06-03 (×2): qty 1

## 2016-06-03 MED ORDER — MORPHINE SULFATE (PF) 2 MG/ML IV SOLN
2.0000 mg | Freq: Once | INTRAVENOUS | Status: AC
  Administered 2016-06-03: 2 mg via INTRAVENOUS
  Filled 2016-06-03: qty 1

## 2016-06-03 MED ORDER — CARVEDILOL 12.5 MG PO TABS
12.5000 mg | ORAL_TABLET | Freq: Two times a day (BID) | ORAL | Status: DC
  Administered 2016-06-03: 12.5 mg via ORAL
  Filled 2016-06-03: qty 1

## 2016-06-03 MED ORDER — ALBUTEROL SULFATE (2.5 MG/3ML) 0.083% IN NEBU: 2.5000 mg | INHALATION_SOLUTION | RESPIRATORY_TRACT | Status: DC | PRN

## 2016-06-03 MED ORDER — HYDROCORTISONE 2.5 % RE CREA: 1.0000 "application " | TOPICAL_CREAM | Freq: Four times a day (QID) | RECTAL | Status: DC | PRN

## 2016-06-03 NOTE — ED Notes (Signed)
EKG given to Dr. Betsey Holiday. Patient transported to X-ray.

## 2016-06-03 NOTE — Progress Notes (Signed)
Patient with 7 beat run of Vtach. BP 79/58, HR 69, resp 20, oxygen 98% on 2 L [place]. Pt asymptomatic. NP on call notified. New orders placed. Will continue to monitor closely.

## 2016-06-03 NOTE — Progress Notes (Signed)
Pt resting at current time. States feel better. Daughter with patient. SRP,RN. Portable interpreter available when needed. SRP, RN

## 2016-06-03 NOTE — Progress Notes (Signed)
MD notified of wide QRS complexes and PACs. Headache improved, Pt resting, O2 at 2l, Magnesium bolus infusing. States feels tired. Will cont to monitor. SRP, RN.

## 2016-06-03 NOTE — ED Notes (Signed)
Pt can go to floor at 10:58.

## 2016-06-03 NOTE — Progress Notes (Signed)
RN paged-pt had 7 beat run of VT and was asymptomatic. Mg earlier tday was 2.4. K was 3.6 and has received 40 Kdur. Repeat BMET stat. Later after pt received Coreg together w/ Lasix her BP dropped to 79/58. Follow up BP 2 hrs later only increased to 91/58 so Coreg and Lisinopril dc'd. Lasix cont'd for now since admitted for systolic CHF exac and need to try to diurese further. (only negative 631 cc)  Erin Hearing, ANP

## 2016-06-03 NOTE — H&P (Signed)
HISTORY AND PHYSICAL       PATIENT DETAILS Name: Katherine Terrell Age: 55 y.o. Sex: female Date of Birth: [birth year] Admit Date: 06/03/2016 PCP:No PCP Per Patient   Patient coming from: Home   CHIEF COMPLAINT:  Epigastric pain-3 days Shortness of breath-3 days  HPI: Katherine Terrell is a 55 y.o. female with medical history significant of chronic lymphocyte leukemia, chronic systolic heart failure (EF 30-35% by TTE on 11/28/15) brought to the ED today for evaluation of epigastric pain and shortness of breath of 3 days' duration. Please note patient speaks very little English, there is a friend at bedside who is translating. Apparently for the past 3 days, patient has had epigastric discomfort, is that she describes as a burning sensation starting in the epigastric area going up to her lower chest. Pain was about 8/10 at its worst, currently after receiving GI cocktail and morphine, pain is very minimal. Pain has been associated with some episodes of nausea and retching but no obvious vomiting. Along with this pain, patient has had some amount of exertional dyspnea for the past few days, she does acknowledge two-pillow orthopnea. It is no PND. She claims that she was not able to come to the ED earlier because she had no one to drive her to the emergency room.  She does acknowledge some subjective fever yesterday but never measured her temperature. She did have diarrhea last week. Denies any headache, denies chest pain to me-claims the pain is mostly in the epigastric area.   ED Course:  In the emergency room, patient was given GI cocktail and some morphine with remarkable improvement in her pain. EKG showed multiple PVCs. WBC was found to 124 K, BNP was elevated at [employer]. Chest x-ray showed interstitial edema. I was asked to admit this patient for further evaluation and treatment  Note: Lives at: Home Mobility: Independent Chronic Indwelling Foley:no   REVIEW OF SYSTEMS:    Constitutional:   No  weight loss, night sweats,   chills, fatigue.  HEENT:    No headaches, Dysphagia,Tooth/dental problems,Sore throat,  No sneezing, itching, ear ache, nasal congestion, post nasal drip  Cardio-vascular: No chest pain, PND,lower extremity edema, anasarca, palpitations  GI:  No  diarrhea, melena or hematochezia  Resp: No  cough, hemoptysis,plueritic chest pain.   Skin:  No rash or lesions.  GU:  No dysuria, change in color of urine, no urgency or frequency.  No flank pain.  Musculoskeletal: No joint pain or swelling.  No decreased range of motion.  No back pain.  Endocrine: No heat intolerance, no cold intolerance, no polyuria, no polydipsia  Psych: No change in mood or affect. No depression or anxiety.  No memory loss.   ALLERGIES:   Allergies  Allergen Reactions  . Tramadol Nausea And Vomiting    PAST MEDICAL HISTORY: Past Medical History:  Diagnosis Date  . Cardiomyopathy    echo: (11/11) EF 25-30% with diffuse hypokinesis, mild left atrial enlargement. 11/11 HIV and ANA negative. TSH normal. LHC (1/12): EF 50-55% no angiographic CAD. Echo (2/12): EF 55-60% moderate LV hypertrophy, grade I diastolic dysfunction, mild MR, normal RV  . Chronic lymphocytic leukemia ([place])    followed by Dr. Ralene Ok  . Hyperlipidemia   . Hypertension   . Leukemia ([place])   . Multinodular goiter    with hypothyroidism    PAST SURGICAL HISTORY: Past Surgical History:  Procedure Laterality Date  . CARDIAC CATHETERIZATION  2012   @ [facility]  . CARDIAC CATHETERIZATION N/A 12/13/2015   Procedure: Left Heart Cath and Coronary Angiography;  Surgeon: Larey Dresser, MD;  Location: [facility];  Service: Cardiovascular;  Laterality: N/A;    MEDICATIONS AT HOME: Prior to Admission medications   Medication Sig Start Date End Date Taking? Authorizing Provider  carvedilol (COREG) 12.5 MG tablet Take 1 tablet (12.5 mg total) by mouth 2 (two) times daily with  a meal. 01/07/16  Yes Jolaine Artist, MD  furosemide (LASIX) 20 MG tablet Take 1 tablet (20 mg total) by mouth daily. 02/04/16  Yes Larey Dresser, MD  levothyroxine (SYNTHROID, LEVOTHROID) 50 MCG tablet Take 1 tablet (50 mcg total) by mouth daily before breakfast. 02/04/16  Yes Larey Dresser, MD  lisinopril (PRINIVIL,ZESTRIL) 20 MG tablet Take 1 tablet (20 mg total) by mouth daily. Reported on 12/23/2015 01/07/16  Yes Shaune Pascal Bensimhon, MD  pantoprazole (PROTONIX) 40 MG tablet Take 40 mg by mouth daily.   Yes Historical Provider, MD    FAMILY HISTORY: Family History  Problem Relation Age of Onset  . Heart attack Mother 39  . Diabetes Sister      SOCIAL HISTORY:  reports that she has never smoked. She has never used smokeless tobacco. She reports that she does not drink alcohol or use drugs.  PHYSICAL EXAM: Blood pressure (!) 95/35, pulse 94, temperature 98.5 F (36.9 C), temperature source Oral, resp. rate (!) 27, weight 84.4 kg (186 lb), SpO2 94 %.  General appearance :Awake, alert, not in any distress. Speech Clear. Not toxic Looking HEENT: Atraumatic and Normocephalic, pupils equally reactive to light and accomodation Neck: supple, +JVD. No cervical lymphadenopathy.  Chest:Good air entry bilaterally,Few bibasilar rales  CVS: S1 S2 regular, no murmurs.  Abdomen: Bowel sounds present, Non tender and not distended with no gaurding, rigidity or rebound. Extremities: B/L Lower Ext shows no edema, both legs are warm to touch Neurology:  Non focal Psychiatric: Normal judgment and insight. Alert and oriented x 3. Normal mood. Skin:No Rash Wounds:N/A  LABS ON ADMISSION:  I have personally reviewed following labs and imaging studies  CBC:  Recent Labs Lab 06/03/16 0614 06/03/16 0728  WBC 110.5* 124.8*  HGB 9.4* 9.5*  HCT 29.1* 29.4*  MCV 85.1 85.2  PLT SPECIMEN CLOTTED 99991111    Basic Metabolic Panel:  Recent Labs Lab 06/03/16 0614  NA 139  K 4.7  CL 109  CO2 23    GLUCOSE 117*  BUN 18  CREATININE 0.80  CALCIUM 8.3*    GFR: Estimated Creatinine Clearance: 84.5 mL/min (by C-G formula based on SCr of 0.8 mg/dL).  Liver Function Tests: No results for input(s): AST, ALT, ALKPHOS, BILITOT, PROT, ALBUMIN in the last 168 hours. No results for input(s): LIPASE, AMYLASE in the last 168 hours. No results for input(s): AMMONIA in the last 168 hours.  Coagulation Profile: No results for input(s): INR, PROTIME in the last 168 hours.  Cardiac Enzymes:  Recent Labs Lab 06/03/16 0707  TROPONINI 0.04*    BNP (last 3 results) No results for input(s): PROBNP in the last 8760 hours.  HbA1C: No results for input(s): HGBA1C in the last 72 hours.  CBG: No results for input(s): GLUCAP in the last 168 hours.  Lipid Profile: No results for input(s): CHOL, HDL, LDLCALC, TRIG, CHOLHDL, LDLDIRECT in the last 72 hours.  Thyroid Function Tests: No results for input(s): TSH, T4TOTAL, FREET4, T3FREE, THYROIDAB in the last 72 hours.  Anemia Panel: No results for  input(s): VITAMINB12, FOLATE, FERRITIN, TIBC, IRON, RETICCTPCT in the last 72 hours.  Urine analysis:    Component Value Date/Time   COLORURINE YELLOW 11/27/2015 1640   APPEARANCEUR CLEAR 11/27/2015 1640   LABSPEC 1.014 11/27/2015 1640   PHURINE 5.0 11/27/2015 1640   GLUCOSEU NEGATIVE 11/27/2015 1640   HGBUR LARGE (A) 11/27/2015 1640   BILIRUBINUR NEGATIVE 11/27/2015 1640   KETONESUR NEGATIVE 11/27/2015 1640   PROTEINUR 100 (A) 11/27/2015 1640   UROBILINOGEN 0.2 05/25/2013 1047   NITRITE NEGATIVE 11/27/2015 1640   LEUKOCYTESUR NEGATIVE 11/27/2015 1640    Sepsis Labs: Lactic Acid, Venous    Component Value Date/Time   LATICACIDVEN 0.52 11/27/2015 1823     Microbiology: No results found for this or any previous visit (from the past 240 hour(s)).    RADIOLOGIC STUDIES ON ADMISSION: Dg Chest 2 View  Result Date: 06/03/2016 CLINICAL DATA:  54 year old female with chest pain. EXAM:  CHEST  2 VIEW COMPARISON:  Chest radiograph dated 12/08/2015 FINDINGS: Two views of the chest demonstrates diffuse bilateral interstitial prominence and Kerley B-lines compatible with interstitial edema. Pneumonia is not excluded. Clinical correlation is recommended. There is no focal consolidation, pleural effusion, or pneumothorax. Top-normal cardiac size. No acute osseous pathology. IMPRESSION: Mild interstitial edema, new from prior study. Pneumonia is not excluded. Clinical correlation is recommended. Electronically Signed   By: Anner Crete M.D.   On: 06/03/2016 06:30    I have personally reviewed images of chest xray   EKG:  Personally reviewed. Appears to be sinus rhythm with PVCs  ASSESSMENT AND PLAN: Present on Admission: . Acute systolic CHF: Does acknowledge some exertional dyspnea-chest x-ray consistent with pulmonary edema-but no excessive volume on exam-does have a mildly elevated JVD-start intravenous Lasix, continue Coreg and lisinopril. Obtain updated echocardiogram. Suspect if continues to do well, she should be able to be discharged home on 8/6.  Marland Kitchen Atypical chest pain: Appears mostly epigastric to me by history. Very consistent with gastroesophageal reflux disease (burning in nature). She has a history of nonischemic cardiomyopathy-cardiac catheterization in 2012 was negative for any significant disease. We'll place on PPI, and as needed GI cocktail. Cardiac enzymes will be cycled. Will check a lipase level and Follow closely.   Marland Kitchen Hypothyroidism: Continue with levothyroxine  . Essential hypertension: Continue with Coreg and lisinopril  . CLL (chronic lymphocytic leukemia): Has significant leukocytosis-this M.D. spoke with patient's primary oncologist-Dr Shadad-over the phone-probably counts have been this high before-and when patient is acutely ill not unexpected. We'll follow CBC.  Marland Kitchen Anemia: Suspect related to CLL. Follow hemoglobin.  . Frequent PVCs: Will give one dose  of magnesium,'s continue to supplement potassium in the setting of intravenous Lasix-monitor in telemetry-continue beta blocker-check magnesium levels and potassium levels tomorrow morning.  Further plan will depend as patient's clinical course evolves and further radiologic and laboratory data become available. Patient will be monitored closely.  Above noted plan was discussed with patient/friend face to face at bedside, they were in agreement.   CONSULTS: None  DVT Prophylaxis: Prophylactic Lovenox   Code Status: Full Code  Disposition Plan:  Discharge back home in 1-2 days, depending on clinical course  Admission status: Observation going to tele  Total time spent  45 minutes.Greater than 50% of this time was spent in counseling, explanation of diagnosis, planning of further management, and coordination of care.  [facility] Pager [phone]  If 7PM-7AM, please contact night-coverage [email or web address] Password [facility] 06/03/2016, 10:47 AM

## 2016-06-03 NOTE — ED Triage Notes (Addendum)
Patient states has had mid upper abdominal pain x1 week. Patient states that pain woke her up this morning.  Patient denies N/V/D.  Patient states also  having intermittent chest pain.  Patient rates pain 10/10

## 2016-06-03 NOTE — ED Provider Notes (Signed)
[facility] DEPT Provider Note   CSN: [id number] Arrival date & time: 06/03/16  [id number]  First Provider Contact:  None       History   Chief Complaint Chief Complaint  Patient presents with  . Chest Pain  . Abdominal Pain    HPI Katherine Terrell is a 55 y.o. female.   Chest Pain   Associated symptoms include abdominal pain, nausea and weakness. Pertinent negatives include no vomiting.  Abdominal Pain   Associated symptoms include nausea. Pertinent negatives include vomiting.   Patient presents with concern of epigastric pain, dyspnea. Symptoms began 4 days ago. Since onset symptoms of been persistent, with associated nausea, subjective fever. No medication changes, nor clear alleviating or exacerbating factors. No confusion, disorientation, vomiting, diarrhea.  Patient continues to take all medication as directed, including Lasix. She acknowledges a history of leukemia, congestive heart failure. No medication taken for leukemia currently, as she is monitoring stage.  Past Medical History:  Diagnosis Date  . Cardiomyopathy    echo: (11/11) EF 25-30% with diffuse hypokinesis, mild left atrial enlargement. 11/11 HIV and ANA negative. TSH normal. LHC (1/12): EF 50-55% no angiographic CAD. Echo (2/12): EF 55-60% moderate LV hypertrophy, grade I diastolic dysfunction, mild MR, normal RV  . Chronic lymphocytic leukemia ([place])    followed by Dr. Ralene Ok  . Hyperlipidemia   . Hypertension   . Leukemia ([place])   . Multinodular goiter    with hypothyroidism    Patient Active Problem List   Diagnosis Date Noted  . Hypoxia   . Acute systolic CHF (congestive heart failure) ([place]) 11/30/2015  . Sepsis ([place] [place]) 11/28/2015  . CAP (community acquired pneumonia) 11/27/2015  . Ventricular tachycardia, non-sustained ([place]) 11/27/2015  . Hyperlipidemia 11/27/2015  . Essential hypertension 11/27/2015  . Influenza B 11/27/2015  . Dehydration 11/27/2015  . Chronic diastolic heart failure  ([place]) 11/27/2015  . CLL (chronic lymphocytic leukemia) ([place]) 12/04/2011  . Hypothyroidism 12/23/2010  . CHRONIC SYSTOLIC HEART FAILURE 123456    Past Surgical History:  Procedure Laterality Date  . CARDIAC CATHETERIZATION  2012   @ [place]  . CARDIAC CATHETERIZATION N/A 12/13/2015   Procedure: Left Heart Cath and Coronary Angiography;  Surgeon: Larey Dresser, MD;  Location: [facility];  Service: Cardiovascular;  Laterality: N/A;    OB History    Gravida Para Term Preterm AB Living   3 3 3     3    SAB TAB Ectopic Multiple Live Births                   Home Medications    Prior to Admission medications   Medication Sig Start Date End Date Taking? Authorizing Provider  carvedilol (COREG) 12.5 MG tablet Take 1 tablet (12.5 mg total) by mouth 2 (two) times daily with a meal. 01/07/16  Yes Jolaine Artist, MD  furosemide (LASIX) 20 MG tablet Take 1 tablet (20 mg total) by mouth daily. 02/04/16  Yes Larey Dresser, MD  levothyroxine (SYNTHROID, LEVOTHROID) 50 MCG tablet Take 1 tablet (50 mcg total) by mouth daily before breakfast. 02/04/16  Yes Larey Dresser, MD  lisinopril (PRINIVIL,ZESTRIL) 20 MG tablet Take 1 tablet (20 mg total) by mouth daily. Reported on 12/23/2015 01/07/16  Yes Shaune Pascal Bensimhon, MD  pantoprazole (PROTONIX) 40 MG tablet Take 40 mg by mouth daily.   Yes Historical Provider, MD    Family History Family History  Problem Relation Age of Onset  . Heart attack Mother 47  .  Diabetes Sister     Social History Social History  Substance Use Topics  . Smoking status: Never Smoker  . Smokeless tobacco: Never Used  . Alcohol use No     Allergies   Tramadol   Review of Systems Review of Systems  Constitutional:       Per HPI, otherwise negative  HENT:       Per HPI, otherwise negative  Respiratory:       Per HPI, otherwise negative  Cardiovascular: Positive for chest pain.  Gastrointestinal: Positive for abdominal pain and nausea.  Negative for vomiting.  Endocrine:       Negative aside from HPI  Genitourinary:       Neg aside from HPI   Musculoskeletal:       Per HPI, otherwise negative  Skin: Negative.   Allergic/Immunologic: Positive for immunocompromised state.  Neurological: Positive for weakness. Negative for syncope.     Physical Exam Updated Vital Signs BP 122/79   Pulse 91   Temp 98.5 F (36.9 C) (Oral)   Resp (!) 29   Wt 186 lb (84.4 kg)   SpO2 95%   BMI 31.93 kg/m   Physical Exam  Constitutional: She is oriented to person, place, and time. She appears well-developed and well-nourished. No distress.  HENT:  Head: Normocephalic and atraumatic.  Eyes: Conjunctivae and EOM are normal.  Cardiovascular: Normal rate.   Tachycardia  Pulmonary/Chest: Breath sounds normal. No stridor. Tachypnea noted.  Abdominal: She exhibits no distension. There is tenderness.  ttp about the epigastrum  Musculoskeletal: She exhibits edema.  Neurological: She is alert and oriented to person, place, and time. No cranial nerve deficit.  Skin: Skin is warm and dry.  Psychiatric: She has a normal mood and affect.  Nursing note and vitals reviewed.    ED Treatments / Results  Labs (all labs ordered are listed, but only abnormal results are displayed) Labs Reviewed  BASIC METABOLIC PANEL - Abnormal; Notable for the following:       Result Value   Glucose, Bld 117 (*)    Calcium 8.3 (*)    All other components within normal limits  CBC - Abnormal; Notable for the following:    WBC 110.5 (*)    RBC 3.42 (*)    Hemoglobin 9.4 (*)    HCT 29.1 (*)    All other components within normal limits  TROPONIN I - Abnormal; Notable for the following:    Troponin I 0.04 (*)    All other components within normal limits  CBC - Abnormal; Notable for the following:    WBC 124.8 (*)    RBC 3.45 (*)    Hemoglobin 9.5 (*)    HCT 29.4 (*)    All other components within normal limits  BRAIN NATRIURETIC PEPTIDE - Abnormal;  Notable for the following:    B Natriuretic Peptide 1,788.2 (*)    All other components within normal limits  I-STAT TROPOININ, ED    EKG  EKG Interpretation  Date/Time:  Saturday June 03 2016 06:20:46 EDT Ventricular Rate:  108 PR Interval:    QRS Duration: 116 QT Interval:  355 QTC Calculation: 427 R Axis:   67 Text Interpretation:  Sinus rhythm Premature atrial complexes Premature ventricular complexes ST-t wave abnormality Artifact Abnormal ekg Confirmed by Carmin Muskrat  MD [phone]) on 06/03/2016 8:06:47 AM       Radiology Dg Chest 2 View  Result Date: 06/03/2016 CLINICAL DATA:  55 year old female with chest pain. EXAM:  CHEST  2 VIEW COMPARISON:  Chest radiograph dated 12/08/2015 FINDINGS: Two views of the chest demonstrates diffuse bilateral interstitial prominence and Kerley B-lines compatible with interstitial edema. Pneumonia is not excluded. Clinical correlation is recommended. There is no focal consolidation, pleural effusion, or pneumothorax. Top-normal cardiac size. No acute osseous pathology. IMPRESSION: Mild interstitial edema, new from prior study. Pneumonia is not excluded. Clinical correlation is recommended. Electronically Signed   By: Anner Crete M.D.   On: 06/03/2016 06:30    Procedures Procedures (including critical care time)  Medications Ordered in ED Medications  gi cocktail (Maalox,Lidocaine,Donnatal) (30 mLs Oral Given 06/03/16 0802)  furosemide (LASIX) injection 40 mg (40 mg Intravenous Given 06/03/16 0759)  morphine 2 MG/ML injection 2 mg (2 mg Intravenous Given 06/03/16 0757)  sodium chloride 0.9 % bolus 1,000 mL (1,000 mLs Intravenous New Bag/Given 06/03/16 0849)     Initial Impression / Assessment and Plan / ED Course  I have reviewed the triage vital signs and the nursing notes.  Pertinent labs & imaging results that were available during my care of the patient were reviewed by me and considered in my medical decision making (see chart for  details).  Clinical Course  Comment By Time  Pain diminished, persistent tachypnea, tachycardia, and with elevated BNP, patient will be admitted. Carmin Muskrat, MD 08/05 [phone]     Chart review notable for history of leukemia, congestive heart failure, prior pneumonia. Patient is not currently taking therapy for leukemia, after diagnosis in 2011, frequent monitoring.  FROM EMR ECHO 1/17, Cath 2/17  LV EF: 30% -   35%   ------------------------------------------------------------------- Indications:      Cardiomyopathy - diastolic 123456.   ------------------------------------------------------------------- History:   Risk factors:  Hypertension.   ------------------------------------------------------------------- Study Conclusions   - Left ventricle: The cavity size was normal. Systolic function was   moderately to severely reduced. The estimated ejection fraction   was in the range of 30% to 35%. Diffuse hypokinesis. The study   was not technically sufficient to allow evaluation of LV   diastolic dysfunction due to atrial fibrillation. - Aortic valve: There was very mild stenosis. There was trivial   regurgitation. Peak gradient (S): 14 mm Hg. - Mitral valve: Structurally normal valve. There was moderate   regurgitation. - Left atrium: The atrium was severely dilated. - Right ventricle: Systolic function was normal. - Right atrium: The atrium was moderately dilated. - Tricuspid valve: There was moderate regurgitation. - Pulmonary arteries: Systolic pressure was moderately to severely   increased. PA peak pressure: 56 mm Hg (S). - Inferior vena cava: The vessel was dilated. The respirophasic   diameter changes were blunted (< 50%), consistent with elevated   central venous pressure. - Pericardium, extracardiac: There was no pericardial effusion. Coronary Findings  Dominance: Right  Left Main  No angiographic coronary disease.  Left Anterior Descending  No angiographic  coronary disease.  Left Circumflex  No angiographic coronary disease.  Right Coronary Artery  No angiographic coronary disease.    10:20 AM Remaining labs notable for elevated BNP. Given the patient's persistent dyspnea, tachycardia, tachypnea, multiple comorbidities, I discussed the case with our cardiology colleagues, subsequently with the hospitalist team for admission.   Final Clinical Impressions(s) / ED Diagnoses   Final diagnoses:  Acute on chronic congestive heart failure, unspecified congestive heart failure type ([place])  Atypical chest pain      Carmin Muskrat, MD 06/03/16 1020

## 2016-06-04 DIAGNOSIS — I5021 Acute systolic (congestive) heart failure: Secondary | ICD-10-CM

## 2016-06-04 DIAGNOSIS — I10 Essential (primary) hypertension: Secondary | ICD-10-CM

## 2016-06-04 DIAGNOSIS — E039 Hypothyroidism, unspecified: Secondary | ICD-10-CM

## 2016-06-04 LAB — MAGNESIUM: Magnesium: 2.2 mg/dL (ref 1.7–2.4)

## 2016-06-04 LAB — BASIC METABOLIC PANEL
ANION GAP: 8 (ref 5–15)
BUN: 21 mg/dL — ABNORMAL HIGH (ref 6–20)
CHLORIDE: 105 mmol/L (ref 101–111)
CO2: 29 mmol/L (ref 22–32)
CREATININE: 1.07 mg/dL — AB (ref 0.44–1.00)
Calcium: 8.6 mg/dL — ABNORMAL LOW (ref 8.9–10.3)
GFR calc non Af Amer: 58 mL/min — ABNORMAL LOW (ref >60)
Glucose, Bld: 111 mg/dL — ABNORMAL HIGH (ref 65–99)
Potassium: 4.4 mmol/L (ref 3.5–5.1)
Sodium: 142 mmol/L (ref 135–145)

## 2016-06-04 MED ORDER — CARVEDILOL 6.25 MG PO TABS: 6.2500 mg | ORAL_TABLET | Freq: Two times a day (BID) | ORAL | 1 refills | Status: DC

## 2016-06-04 MED ORDER — ASPIRIN EC 81 MG PO TBEC
81.0000 mg | DELAYED_RELEASE_TABLET | Freq: Every day | ORAL | 0 refills | Status: AC
Start: 2016-06-04 — End: ?

## 2016-06-04 MED ORDER — FUROSEMIDE 40 MG PO TABS: 40.0000 mg | ORAL_TABLET | Freq: Every day | ORAL | 1 refills | Status: DC

## 2016-06-04 MED ORDER — LISINOPRIL 10 MG PO TABS: 10.0000 mg | ORAL_TABLET | Freq: Every day | ORAL | 0 refills | Status: DC

## 2016-06-04 NOTE — H&P (Signed)
Physician Discharge Summary  Katherine Terrell [id number] DOB: 10-06-1961 DOA: 06/03/2016  PCP: No PCP Per Patient  Admit date: 06/03/2016 Discharge date: 06/04/2016  Time spent: 35 minutes  Recommendations for Outpatient Follow-up:  1. Please follow-up on blood pressures, she was hypotensive during this hospitalization for which Coreg was decreased to 6.25 mg by mouth twice a day and lisinopril decreased to 10 mg by mouth daily 2. Please follow-up on volume status, she had evidence of volume overload. She was discharged on increased dose of Lasix at 40 mg by mouth daily. 3. BMP on hospital follow-up visit as her Lasix dose has been increased   Discharge Diagnoses:  Principal Problem:   Acute systolic CHF (congestive heart failure) ([place]) Active Problems:   Hypothyroidism   CLL (chronic lymphocytic leukemia) ([place])   Hyperlipidemia   Essential hypertension   Discharge Condition: Stable  Diet recommendation: Heart healthy  Filed Weights   06/03/16 0604 06/03/16 1133  Weight: 84.4 kg (186 lb) 84.4 kg (186 lb 1.6 oz)    History of present illness:  Katherine Terrell is a 55 y.o. female with medical history significant of chronic lymphocyte leukemia, chronic systolic heart failure (EF 30-35% by TTE on 11/28/15) brought to the ED today for evaluation of epigastric pain and shortness of breath of 3 days' duration. Please note patient speaks very little English, there is a friend at bedside who is translating. Apparently for the past 3 days, patient has had epigastric discomfort, is that she describes as a burning sensation starting in the epigastric area going up to her lower chest. Pain was about 8/10 at its worst, currently after receiving GI cocktail and morphine, pain is very minimal. Pain has been associated with some episodes of nausea and retching but no obvious vomiting. Along with this pain, patient has had some amount of exertional dyspnea for the past few days, she does acknowledge  two-pillow orthopnea. It is no PND. She claims that she was not able to come to the ED earlier because she had no one to drive her to the emergency room.  Hospital Course:  Katherine Terrell is a 55 year old female with a history of chronic lymphocytic leukemia, nonischemic cardiomyopathy having an EF of 30-35% based off on transthoracic echocardiogram on 11/28/2015, admitted to the medicine service on 06/03/2016 when she presented with complaints of shortness of breath and atypical chest pain. Initial workup included chest x-ray that revealed presence of interstitial edema. Suspected acute on chronic systolic congestive heart failure as she was treated with IV Lasix. With regard to chest pain, EKG did not reveal acute ischemic changes and suspected chest pain could have been related to gastroesophageal reflux disease. She was monitored overnight with continuous cardiac monitoring and cycling of troponins. Troponins remain stable. The following day she reported significant improvement to her breathing. Her Lasix was increased from 20-40 mg by mouth daily on discharge. Due to soft blood pressures her Coreg dose was decreased to 6.25 mg by mouth daily and lisinopril dose brought down to 10 mg by mouth daily. Please follow-up on blood pressures on hospital follow-up visit   Discharge Exam: Vitals:   06/04/16 0536 06/04/16 0951  BP: 107/67 108/62  Pulse: 68 78  Resp: 18 18  Temp: 97.6 F (36.4 C) 98 F (36.7 C)    General: Nontoxic-appearing, she is awake and alert oriented Cardiovascular: Regular rate and rhythm normal S1-S2 Respiratory: Normal respiratory effort, lungs are clear to auscultation bilaterally Abdomen: Soft nontender nondistended Extremities: No edema  Discharge  Instructions   Discharge Instructions    Amb Referral to HF Clinic    Complete by:  As directed   Call MD for:    Complete by:  As directed   Call MD for:  difficulty breathing, headache or visual disturbances    Complete  by:  As directed   Call MD for:  extreme fatigue    Complete by:  As directed   Call MD for:  hives    Complete by:  As directed   Call MD for:  persistant dizziness or light-headedness    Complete by:  As directed   Call MD for:  persistant nausea and vomiting    Complete by:  As directed   Call MD for:  redness, tenderness, or signs of infection (pain, swelling, redness, odor or green/yellow discharge around incision site)    Complete by:  As directed   Call MD for:  severe uncontrolled pain    Complete by:  As directed   Call MD for:  temperature >100.4    Complete by:  As directed   Diet - low sodium heart healthy    Complete by:  As directed   Increase activity slowly    Complete by:  As directed     Current Discharge Medication List    START taking these medications   Details  aspirin EC 81 MG tablet Take 1 tablet (81 mg total) by mouth daily. Qty: 30 tablet, Refills: 0      CONTINUE these medications which have CHANGED   Details  carvedilol (COREG) 6.25 MG tablet Take 1 tablet (6.25 mg total) by mouth 2 (two) times daily with a meal. Qty: 60 tablet, Refills: 1    furosemide (LASIX) 40 MG tablet Take 1 tablet (40 mg total) by mouth daily. Qty: 30 tablet, Refills: 1    lisinopril (ZESTRIL) 10 MG tablet Take 1 tablet (10 mg total) by mouth daily. Qty: 30 tablet, Refills: 0      CONTINUE these medications which have NOT CHANGED   Details  levothyroxine (SYNTHROID, LEVOTHROID) 50 MCG tablet Take 1 tablet (50 mcg total) by mouth daily before breakfast. Qty: 30 tablet, Refills: 0    pantoprazole (PROTONIX) 40 MG tablet Take 40 mg by mouth daily.       Allergies  Allergen Reactions  . Tramadol Nausea And Vomiting   Follow-up Information    Elyse Jarvis, MD Follow up in 1 week(s).   Specialty:  Family Medicine Contact information: [address] [place] [address] [phone]        Ida Rogue, MD Follow up in 1 week(s).   Specialty:   Cardiology Contact information: [place] [address] [phone]            The results of significant diagnostics from this hospitalization (including imaging, microbiology, ancillary and laboratory) are listed below for reference.    Significant Diagnostic Studies: Dg Chest 2 View  Result Date: 06/03/2016 CLINICAL DATA:  55 year old female with chest pain. EXAM: CHEST  2 VIEW COMPARISON:  Chest radiograph dated 12/08/2015 FINDINGS: Two views of the chest demonstrates diffuse bilateral interstitial prominence and Kerley B-lines compatible with interstitial edema. Pneumonia is not excluded. Clinical correlation is recommended. There is no focal consolidation, pleural effusion, or pneumothorax. Top-normal cardiac size. No acute osseous pathology. IMPRESSION: Mild interstitial edema, new from prior study. Pneumonia is not excluded. Clinical correlation is recommended. Electronically Signed   By: Laren Everts.D.  On: 06/03/2016 06:30    Microbiology: No results found for this or any previous visit (from the past 240 hour(s)).   Labs: Basic Metabolic Panel:  Recent Labs Lab 06/03/16 0614 06/03/16 1255 06/03/16 1603 06/03/16 2144 06/04/16 0521  NA 139  --   --  138 142  K 4.7  --  3.6 4.4 4.4  CL 109  --   --  104 105  CO2 23  --   --  29 29  GLUCOSE 117*  --   --  135* 111*  BUN 18  --   --  20 21*  CREATININE 0.80 0.92  --  1.18* 1.07*  CALCIUM 8.3*  --   --  8.3* 8.6*  MG  --   --  2.4  --  2.2   Liver Function Tests: No results for input(s): AST, ALT, ALKPHOS, BILITOT, PROT, ALBUMIN in the last 168 hours.  Recent Labs Lab 06/03/16 0707  LIPASE 14   No results for input(s): AMMONIA in the last 168 hours. CBC:  Recent Labs Lab 06/03/16 0614 06/03/16 0728 06/03/16 1255  WBC 110.5* 124.8* 125.4*  HGB 9.4* 9.5* 9.2*  HCT 29.1* 29.4* 28.6*  MCV 85.1 85.2 84.9  PLT SPECIMEN CLOTTED 155 144*   Cardiac Enzymes:  Recent  Labs Lab 06/03/16 0707 06/03/16 1255 06/03/16 1603 06/03/16 1901  TROPONINI 0.04* 0.04* 0.04* 0.04*   BNP: BNP (last 3 results)  Recent Labs  11/27/15 2216 06/03/16 0728  BNP 1,803.4* 1,788.2*    ProBNP (last 3 results) No results for input(s): PROBNP in the last 8760 hours.  CBG: No results for input(s): GLUCAP in the last 168 hours.     Signed:  Kelvin Cellar MD.  [facility] 06/04/2016, 12:20 PM

## 2016-06-04 NOTE — Progress Notes (Signed)
[employer] video interpreting used with patient. Interpreter Adriana [phone] at 1042.

## 2016-06-04 NOTE — Progress Notes (Signed)
[employer] video interpreting used with patient. Interpreter Cathlean Cower [phone]. Education given on Discharge instructions, follow up appointments, medications and diet. Questions answered.

## 2016-06-06 NOTE — Discharge Summary (Signed)
Katherine Terrell [id number] DOB: 11-20-1960 DOA: 06/03/2016  PCP: No PCP Per Patient  Admit date: 06/03/2016 Discharge date: 06/04/2016  Time spent: 35 minutes  Recommendations for Outpatient Follow-up:  1. Please follow-up on blood pressures, she was hypotensive during this hospitalization for which Coreg was decreased to 6.25 mg by mouth twice a day and lisinopril decreased to 10 mg by mouth daily 2. Please follow-up on volume status, she had evidence of volume overload. She was discharged on increased dose of Lasix at 40 mg by mouth daily. 3. BMP on hospital follow-up visit as her Lasix dose has been increased   Discharge Diagnoses:  Principal Problem:   Acute systolic CHF (congestive heart failure) (HCC) Active Problems:   Hypothyroidism   CLL (chronic lymphocytic leukemia) ([place])   Hyperlipidemia   Essential hypertension   Discharge Condition: Stable  Diet recommendation: Heart healthy      Filed Weights   06/03/16 0604 06/03/16 1133  Weight: 84.4 kg (186 lb) 84.4 kg (186 lb 1.6 oz)    History of present illness:  Katherine Terrell a 55 y.o.femalewith medical history significant of chronic lymphocyte leukemia, chronic systolic heart failure (EF 30-35% by TTE on 11/28/15) brought to the ED today for evaluation of epigastric pain and shortness of breath of 3 days' duration. Please note patient speaks very little English, there is a friend at bedside who is translating. Apparently for the past 3 days, patient has had epigastric discomfort, is that she describes as a burning sensation starting in the epigastric area going up to her lower chest. Pain was about 8/10 at its worst, currently after receiving GI cocktail and morphine, pain is very minimal. Pain has been associated with some episodes of nausea and retching but no obvious vomiting. Along with this pain, patient has had some amount of exertional dyspnea for the past few days, she does acknowledge two-pillow  orthopnea. It is no PND. She claims that she was not able to come to the ED earlier because she had no one to drive her to the emergency room.  Hospital Course:  Katherine Terrell is a 55 year old female with a history of chronic lymphocytic leukemia, nonischemic cardiomyopathy having an EF of 30-35% based off on transthoracic echocardiogram on 11/28/2015, admitted to the medicine service on 06/03/2016 when she presented with complaints of shortness of breath and atypical chest pain. Initial workup included chest x-ray that revealed presence of interstitial edema. Suspected acute on chronic systolic congestive heart failure as she was treated with IV Lasix. With regard to chest pain, EKG did not reveal acute ischemic changes and suspected chest pain could have been related to gastroesophageal reflux disease. She was monitored overnight with continuous cardiac monitoring and cycling of troponins. Troponins remain stable. The following day she reported significant improvement to her breathing. Her Lasix was increased from 20-40 mg by mouth daily on discharge. Due to soft blood pressures her Coreg dose was decreased to 6.25 mg by mouth daily and lisinopril dose brought down to 10 mg by mouth daily. Please follow-up on blood pressures on hospital follow-up visit   Discharge Exam:     Vitals:   06/04/16 0536 06/04/16 0951  BP: 107/67 108/62  Pulse: 68 78  Resp: 18 18  Temp: 97.6 F (36.4 C) 98 F (36.7 C)    General: Nontoxic-appearing, she is awake and alert oriented Cardiovascular: Regular rate and rhythm normal S1-S2 Respiratory: Normal respiratory effort, lungs are clear to auscultation bilaterally Abdomen: Soft nontender nondistended Extremities: No edema  Discharge Instructions       Discharge Instructions    Amb Referral to HF Clinic    Complete by:  As directed   Call MD for:    Complete by:  As directed   Call MD for:  difficulty breathing, headache or visual disturbances     Complete by:  As directed   Call MD for:  extreme fatigue    Complete by:  As directed   Call MD for:  hives    Complete by:  As directed   Call MD for:  persistant dizziness or light-headedness    Complete by:  As directed   Call MD for:  persistant nausea and vomiting    Complete by:  As directed   Call MD for:  redness, tenderness, or signs of infection (pain, swelling, redness, odor or green/yellow discharge around incision site)    Complete by:  As directed   Call MD for:  severe uncontrolled pain    Complete by:  As directed   Call MD for:  temperature >100.4    Complete by:  As directed   Diet - low sodium heart healthy    Complete by:  As directed   Increase activity slowly    Complete by:  As directed        Current Discharge Medication List       START taking these medications   Details  aspirin EC 81 MG tablet Take 1 tablet (81 mg total) by mouth daily. Qty: 30 tablet, Refills: 0         CONTINUE these medications which have CHANGED   Details  carvedilol (COREG) 6.25 MG tablet Take 1 tablet (6.25 mg total) by mouth 2 (two) times daily with a meal. Qty: 60 tablet, Refills: 1    furosemide (LASIX) 40 MG tablet Take 1 tablet (40 mg total) by mouth daily. Qty: 30 tablet, Refills: 1    lisinopril (ZESTRIL) 10 MG tablet Take 1 tablet (10 mg total) by mouth daily. Qty: 30 tablet, Refills: 0         CONTINUE these medications which have NOT CHANGED   Details  levothyroxine (SYNTHROID, LEVOTHROID) 50 MCG tablet Take 1 tablet (50 mcg total) by mouth daily before breakfast. Qty: 30 tablet, Refills: 0    pantoprazole (PROTONIX) 40 MG tablet Take 40 mg by mouth daily.           Allergies  Allergen Reactions  . Tramadol Nausea And Vomiting      Follow-up Information    Elyse Jarvis, MD Follow up in 1 week(s).   Specialty:  Family Medicine Contact information: [address] [place] [address] [phone]         Ida Rogue, MD Follow up in 1 week(s).   Specialty:  Cardiology Contact information: [place] [address] [phone]             The results of significant diagnostics from this hospitalization (including imaging, microbiology, ancillary and laboratory) are listed below for reference.    Significant Diagnostic Studies:  Imaging Results  Dg Chest 2 View  Result Date: 06/03/2016 CLINICAL DATA:  55 year old female with chest pain. EXAM: CHEST  2 VIEW COMPARISON:  Chest radiograph dated 12/08/2015 FINDINGS: Two views of the chest demonstrates diffuse bilateral interstitial prominence and Kerley B-lines compatible with interstitial edema. Pneumonia is not excluded. Clinical correlation is recommended. There is no focal consolidation, pleural effusion, or pneumothorax. Top-normal cardiac size. No  acute osseous pathology. IMPRESSION: Mild interstitial edema, new from prior study. Pneumonia is not excluded. Clinical correlation is recommended. Electronically Signed   By: Anner Crete M.D.   On: 06/03/2016 06:30     Microbiology: No results found for this or any previous visit (from the past 240 hour(s)).   Labs: Basic Metabolic Panel:  Last Labs    Recent Labs Lab 06/03/16 0614 06/03/16 1255 06/03/16 1603 06/03/16 2144 06/04/16 0521  NA 139  --   --  138 142  K 4.7  --  3.6 4.4 4.4  CL 109  --   --  104 105  CO2 23  --   --  29 29  GLUCOSE 117*  --   --  135* 111*  BUN 18  --   --  20 21*  CREATININE 0.80 0.92  --  1.18* 1.07*  CALCIUM 8.3*  --   --  8.3* 8.6*  MG  --   --  2.4  --  2.2     Liver Function Tests: Last Labs   No results for input(s): AST, ALT, ALKPHOS, BILITOT, PROT, ALBUMIN in the last 168 hours.    Last Labs    Recent Labs Lab 06/03/16 0707  LIPASE 14     Last Labs   No results for input(s): AMMONIA in the last 168 hours.   CBC:  Last Labs    Recent Labs Lab 06/03/16 0614  06/03/16 0728 06/03/16 1255  WBC 110.5* 124.8* 125.4*  HGB 9.4* 9.5* 9.2*  HCT 29.1* 29.4* 28.6*  MCV 85.1 85.2 84.9  PLT SPECIMEN CLOTTED 155 144*     Cardiac Enzymes:  Last Labs    Recent Labs Lab 06/03/16 0707 06/03/16 1255 06/03/16 1603 06/03/16 1901  TROPONINI 0.04* 0.04* 0.04* 0.04*     BNP: BNP (last 3 results)  Recent Labs (within last 365 days)   Recent Labs  11/27/15 2216 06/03/16 0728  BNP 1,803.4* 1,788.2*      ProBNP (last 3 results) Recent Labs (within last 365 days)  No results for input(s): PROBNP in the last 8760 hours.    CBG: Last Labs   No results for input(s): GLUCAP in the last 168 hours.       Signed:  Kelvin Cellar MD.  [facility] 06/04/2016, 12:20 PM

## 2016-06-07 ENCOUNTER — Ambulatory Visit (INDEPENDENT_AMBULATORY_CARE_PROVIDER_SITE_OTHER): Payer: BLUE CROSS/BLUE SHIELD | Admitting: Physician Assistant

## 2016-06-07 ENCOUNTER — Encounter: Payer: Self-pay | Admitting: Physician Assistant

## 2016-06-07 VITALS — BP 118/80 | HR 84 | Ht 62.0 in | Wt 181.8 lb

## 2016-06-07 DIAGNOSIS — I5042 Chronic combined systolic (congestive) and diastolic (congestive) heart failure: Secondary | ICD-10-CM

## 2016-06-07 DIAGNOSIS — R0602 Shortness of breath: Secondary | ICD-10-CM

## 2016-06-07 DIAGNOSIS — I10 Essential (primary) hypertension: Secondary | ICD-10-CM

## 2016-06-07 DIAGNOSIS — R079 Chest pain, unspecified: Secondary | ICD-10-CM

## 2016-06-07 DIAGNOSIS — Z789 Other specified health status: Secondary | ICD-10-CM

## 2016-06-07 DIAGNOSIS — C9110 Chronic lymphocytic leukemia of B-cell type not having achieved remission: Secondary | ICD-10-CM

## 2016-06-07 DIAGNOSIS — I429 Cardiomyopathy, unspecified: Secondary | ICD-10-CM

## 2016-06-07 DIAGNOSIS — I428 Other cardiomyopathies: Secondary | ICD-10-CM

## 2016-06-07 MED ORDER — LISINOPRIL 5 MG PO TABS: 5.0000 mg | ORAL_TABLET | Freq: Every day | ORAL | 3 refills | Status: DC

## 2016-06-07 NOTE — Patient Instructions (Addendum)
Medication Instructions:  Your physician has recommended you make the following change in your medication:  DECREASE lisinopril to 5mg  once daily   Labwork: BMET  Testing/Procedures: none  Follow-Up: Your physician recommends that you schedule a follow-up appointment in: one month.   Any Other Special Instructions Will Be Listed Below (If Applicable). No more than 2L of fluid per day. Follow a low sodium diet. Monitor blood pressure twice daily and weigh yourself every morning.      If you need a refill on your cardiac medications before your next appointment, please call your pharmacy.   Plan de alimentacin con bajo contenido de sodio (Low-Sodium Eating Plan) El sodio aumenta la presin arterial y hace que el cuerpo retenga lquidos. El consumo de alimentos con menos sodio ayuda a Armed forces technical officer presin arterial, a Forensic psychologist y a Tour manager, el hgado y los riones. Agregar sal (cloruro de sodio) a los alimentos aumenta el aporte de [place]. La mayor parte del sodio proviene de los alimentos enlatados, envasados y congelados. La pizza, la comida rpida y la comida de los restaurantes tambin contienen mucho sodio. Aunque usted tome medicamentos para bajar la presin arterial o reducir el lquido del cuerpo, es importante que disminuya el aporte de sodio de los alimentos. EN QU CONSISTE EL PLAN? La [employer] de las personas deberan limitar la ingesta de sodio a 2300mg  por Training and development officer. El mdico le recomienda que limite su consumo de sodio a __________ [employer].  QU DEBO SABER ACERCA DE ESTE PLAN DE [place]? Para el plan de alimentacin con bajo contenido de sodio, debe seguir estas pautas generales:  Elija alimentos con un valor porcentual diario de sodio de menos del 5% (segn se indica en la etiqueta).  Use hierbas o aderezos sin sal, en lugar de sal de mesa o sal marina.  Consulte al mdico o farmacutico antes de usar sustitutos de la sal.  Coma alimentos  frescos.  Coma ms frutas y verduras.  Limite las verduras enlatadas. Si las consume, enjuguelas bien para disminuir el sodio.  Limite el consumo de queso a 1onza (28g) por Training and development officer.  Coma productos con bajo contenido de sodio, cuya etiqueta suele decir "bajo contenido de sodio" o "sin agregado de sal".  Evite alimentos que contengan glutamato monosdico (MSG), que a veces se agrega a la comida [place] y a algunos alimentos enlatados.  Consulte las etiquetas de los alimentos (etiquetas de informacin nutricional) para saber cunto sodio contiene una porcin.  Consuma ms comida casera y menos de restaurante, de buf y comida rpida.  Cuando coma en un restaurante, pida que preparen su comida con menos sal o, en lo posible, sin nada de sal. CMO LEO LA INFORMACIN SOBRE EL SODIO EN LAS ETIQUETAS DE LOS ALIMENTOS? La etiqueta de informacin nutricional indica la cantidad de sodio en una porcin de alimento. Si come ms de una porcin, debe multiplicar la cantidad indicada de sodio por la cantidad de porciones. Las etiquetas de los alimentos tambin pueden indicar lo siguiente:  Sin sodio: menos de 5mg  por porcin.  Cantidad muy baja de sodio: 35mg  o menos por porcin.  Cantidad baja de sodio: 140mg  o menos por porcin.  Menor cantidad de sodio: 50% menos de sodio en una porcin. Por ejemplo, si un alimento generalmente contiene 300 mg de sodio se modifica para ser [employer], tendr 150 mg de sodio.  Sodio reducido: 25% menos de Agricultural consultant. Por ejemplo, si un alimento que por lo  general contiene 400mg  de sodio se modifica para convertirse en un alimento de sodio reducido, tendr 300mg  de sodio. QU ALIMENTOS PUEDO COMER? Cereales Cereales con bajo contenido de sodio, como [place], arroz y trigo [place], y trigo triturado. Galletas con bajo contenido de [place]. Arroz y pastas sin sal. Pan con bajo contenido de [place].  Verduras Verduras frescas o congeladas.  Verduras enlatadas con bajo contenido de sodio o reducido de sodio. Pasta y salsa de tomate con contenido bajo o [place]. Jugos de tomate y verduras con contenido bajo o reducido de sodio.  Lambert Mody Frutas frescas, congeladas y IT sales professional. Jugo de frutas.  Carnes y otros productos con protenas Atn y salmn enlatado con bajo contenido de [place]. Carne de vaca o ave, pescado y frutos de mar frescos o congelados. Cordero. Frutos secos sin sal. Lentejas, frijoles y guisantes secos, sin sal agregada. Frijoles enlatados sin sal. Sopas caseras sin sal. Huevos.  Lcteos Leche. Leche de soja. Queso ricota. Quesos con contenido bajo o reducido de sodio. Yogur.  Condimentos Hierbas y especias frescas y secas. Aderezos sin sal. Cebolla y ajo en polvo. Variedades de [place] y ketchup con bajo contenido de sodio. Rbano picante fresco o refrigerado. Jugo de limn.  Grasas y aceites Aderezos para ensalada con contenido reducido de [place]. Mantequilla sin sal.  Otros Palomitas de maz y pretzels sin sal.  Los artculos mencionados arriba pueden no ser [employer] de las bebidas o los alimentos recomendados. Comunquese con el nutricionista para conocer ms opciones. QU ALIMENTOS NO SE RECOMIENDAN? Cereales  Cereales instantneos para comer caliente. Mezclas para bizcochos, panqueques y rellenos de pan. Crutones. Mezclas para pastas o arroz con condimento. Envases comerciales de sopa de fideos. Macarrones con queso envasados o congelados. Harina leudante. Galletas saladas comunes. Verduras Verduras enlatadas comunes. Pasta y salsa de tomate en lata comunes. Jugos comunes de tomate y de verduras. Verduras Surveyor, minerals. Papas fritas saladas. Aceitunas. Pepinillos. Salsas. Chucrut. Salsa. Carnes y otros productos con protenas Carne de vaca, pescado o frutos de mar que est salada, [place], [place], condimentada con especias o con pickles. Panceta, jamn, salchichas, perros calientes, carne  curada, carne picada (carne envasada de buey) y embutidos. Cerdo salado. Cecina o charqui. Arenque en escabeche. Anchoas, atn enlatado comn y sardinas. Frutos secos con sal. Ines Bloomer para untar y quesos procesados. Requesn. Queso azul y cottage. Suero de [place].  Condimentos Sal de cebolla y ajo, sal condimentada, sal de mesa y sal marina. Salsas en lata y envasadas. Salsa [place]. Salsa trtara. Salsa barbacoa. Salsa teriyaki. Salsa de soja, incluso la que tiene contenido reducido de [place]. Salsa de carne. Salsa de pescado. Salsa de [place]. Salsa rosada. Rbano picante envasado. Ketchup y mostaza comunes. Saborizantes y tiernizantes para carne. Caldo en cubitos. Salsa picante. Salsa tabasco. Adobos. Aderezos para tacos. Salsas. Grasas y aceites Aderezos comunes para ensalada. [place] con sal. Margarina. Mantequilla clarificada. Grasa de panceta.  Otros Nachos y papas fritas envasadas. Maz inflado y frituras de maz. Palomitas de maz y pretzels con sal. Sopas enlatadas o en polvo. Pizza. Pasteles y entradas congeladas.  Los artculos mencionados arriba pueden no ser [employer] de las bebidas y los alimentos que se Higher education careers adviser. Comunquese con el nutricionista para obtener ms informacin.   Esta informacin no tiene Marine scientist el consejo del mdico. Asegrese de hacerle al mdico cualquier pregunta que tenga.   Document Released: 10/16/2005 Document Revised: 11/06/2014 Elsevier Interactive Patient Education [employer].

## 2016-06-07 NOTE — Progress Notes (Signed)
Cardiology Office Note Date:  06/07/2016  Patient ID:  Naijah, Swayzer 06/04/1961, MRN [id number] PCP:  [facility]  Cardiologist:  Dr. Aundra Dubin, MD    Chief Complaint: Hospital follow up  History of Present Illness: Eriella Nuckolls is a 55 y.o. female with history of HTN, CLL, NICM/chronic combined CHF with EF that initially improved to 50-55% in 2012 with med adjustment currently with EF of 30-35% by echo 10/2015, and hypothyroidism. Previously followed by Dr. Aundra Dubin but was lost to follow up from 2012 to 10/2015 (hospital admission) who presents for hospital follow up for acute on chronic systolic CHF.   She has had several hospital admissions this year. Admitted 11/27/15 with influenza B and had been placed on Tamiflu by PCP but had not yet started.CXR with LLL infiltrate and sp02 89-91%. EKG with ST and PVCs and couplets.In the ER her BP dropped from [id number] systolic to the [id number] fluids given.Pt admitted.Echo showed EF 30-35% diffuse hypokinesis. Mild AS, trivial regurg. LA severely dilated. Mod MR, RA was moderately dilated and moderate TR. PA pk pressure 56 mm HG- mod pul HTN. EF is down from 50-55% in 2012. Of note, she has hx of cardiomyopathy 08/2010 during hospitalization for "aypical PNA" with EF 25-30% unknown etiology. Cardiac cath performed by Dr. Aundra Dubin 10/2010 with normal coronary arteries. Repeat echo after cath in 2/12 with EF 50-55%.LHC in 2/17 with no CAD, EF seemed a bit better at 45% on LV-gram. Admitted again in early August 2017 for acute on chronic combined CHF. She was diuresed. She became hypotensive and her Coreg was decreased, Lasix dose was increased to 40 mg daily.   She feels much better since her discharge from [facility] over the past weekend. SOB is much improved, none at this time. She is back to sleeping at her baseline 2 pillows. No LE edema, early satiety, chest pain, presyncope, or syncope. She is tolerating all medications as directed. Some  dizziness with positional changes. She has not missed any medication doses. She continues to eat salt and make home made foods with salt added. She is not drinking more than 2 L of fluids daily. Weight 181 this morning, 180 at time of discharge on 8/6.    Past Medical History:  Diagnosis Date  . Cardiomyopathy    echo: (11/11) EF 25-30% with diffuse hypokinesis, mild left atrial enlargement. 11/11 HIV and ANA negative. TSH normal. LHC (1/12): EF 50-55% no angiographic CAD. Echo (2/12): EF 55-60% moderate LV hypertrophy, grade I diastolic dysfunction, mild MR, normal RV  . Chronic lymphocytic leukemia ([place])    followed by Dr. Ralene Ok  . Hyperlipidemia   . Hypertension   . Leukemia ([place])   . Multinodular goiter    with hypothyroidism    Past Surgical History:  Procedure Laterality Date  . CARDIAC CATHETERIZATION  2012   @ [facility]  . CARDIAC CATHETERIZATION N/A 12/13/2015   Procedure: Left Heart Cath and Coronary Angiography;  Surgeon: Larey Dresser, MD;  Location: [facility];  Service: Cardiovascular;  Laterality: N/A;    Current Outpatient Prescriptions  Medication Sig Dispense Refill  . aspirin EC 81 MG tablet Take 1 tablet (81 mg total) by mouth daily. 30 tablet 0  . carvedilol (COREG) 6.25 MG tablet Take 1 tablet (6.25 mg total) by mouth 2 (two) times daily with a meal. 60 tablet 1  . furosemide (LASIX) 40 MG tablet Take 1 tablet (40 mg total) by mouth daily. 30 tablet 1  .  levothyroxine (SYNTHROID, LEVOTHROID) 50 MCG tablet Take 1 tablet (50 mcg total) by mouth daily before breakfast. 30 tablet 0  . lisinopril (ZESTRIL) 10 MG tablet Take 1 tablet (10 mg total) by mouth daily. 30 tablet 0   No current facility-administered medications for this visit.     Allergies:   Tramadol   Social History:  The patient  reports that she has never smoked. She has never used smokeless tobacco. She reports that she does not drink alcohol or use drugs.   Family History:  The  patient's family history includes Diabetes in her sister; Heart attack (age of onset: 107) in her mother.  ROS:   Review of Systems  Constitutional: Negative for chills, diaphoresis, fever, malaise/fatigue and weight loss.  HENT: Negative for congestion.   Eyes: Negative for discharge and redness.  Respiratory: Negative for cough, sputum production, shortness of breath and wheezing.   Cardiovascular: Negative for chest pain, palpitations, orthopnea, claudication, leg swelling and PND.  Gastrointestinal: Negative for abdominal pain, heartburn, nausea and vomiting.  Musculoskeletal: Negative for falls and myalgias.  Skin: Negative for rash.  Neurological: Positive for headaches. Negative for dizziness, tingling, tremors, sensory change, speech change, focal weakness, loss of consciousness and weakness.  Endo/Heme/Allergies: Does not bruise/bleed easily.  Psychiatric/Behavioral: Negative for substance abuse. The patient is not nervous/anxious.   All other systems reviewed and are negative.    PHYSICAL EXAM:  VS:  BP 118/80 (BP Location: Left Arm, Patient Position: Sitting, Cuff Size: Normal)   Pulse 84   Ht 5\' 2"  (1.575 m)   Wt 181 lb 12 oz (82.4 kg)   BMI 33.24 kg/m  BMI: Body mass index is 33.24 kg/m.  Physical Exam  Constitutional: She is oriented to person, place, and time. She appears well-developed and well-nourished.  HENT:  Head: Normocephalic and atraumatic.  Eyes: Right eye exhibits no discharge. Left eye exhibits no discharge.  Neck: Normal range of motion. No JVD present.  Cardiovascular: Normal rate, regular rhythm, S1 normal, S2 normal and normal heart sounds.  Exam reveals no distant heart sounds, no friction rub, no midsystolic click and no opening snap.   No murmur heard. Pulmonary/Chest: Effort normal and breath sounds normal. No respiratory distress. She has no decreased breath sounds. She has no wheezes. She has no rales. She exhibits no tenderness.  Abdominal:  Soft. She exhibits no distension. There is no tenderness.  Musculoskeletal: She exhibits no edema.  Neurological: She is alert and oriented to person, place, and time.  Skin: Skin is warm and dry. No cyanosis. Nails show no clubbing.  Psychiatric: She has a normal mood and affect. Her speech is normal and behavior is normal. Judgment and thought content normal.     EKG:  Was ordered and interpreted by me today. Shows NSR with frequent PVCs, 80 bpm, TWI leads II, III, aVF, V3-V6  Recent Labs: 11/27/2015: TSH 2.087 02/09/2016: ALT 11 06/03/2016: B Natriuretic Peptide 1,788.2; Hemoglobin 9.2; Platelets 144 06/04/2016: BUN 21; Creatinine, Ser 1.07; Magnesium 2.2; Potassium 4.4; Sodium 142  11/29/2015: Cholesterol 157; HDL 30; LDL Cholesterol 104; Total CHOL/HDL Ratio 5.2; Triglycerides 116; VLDL 23   Estimated Creatinine Clearance: 59.8 mL/min (by C-G formula based on SCr of 1.07 mg/dL).   Wt Readings from Last 3 Encounters:  06/07/16 181 lb 12 oz (82.4 kg)  06/04/16 180 lb 1.9 oz (81.7 kg)  02/09/16 183 lb 6.4 oz (83.2 kg)     Other studies reviewed: Additional studies/records reviewed today include: summarized above  ASSESSMENT AND PLAN:  1. Chronic combined CHF/NICM: She does not appear to be volume overloaded at this time. Continue Lasix 40 mg daily, Coreg 6.25 mg bid. Decrease lisinopril 5 mg given soft BP. EF 45% by LV gram 12/2015, not a candidate for Entresto at this time. Suspect she would have difficulty affording this. Limit PO fluid consumption to less than 2 L daily. Limit salt. No fast foods. Call with weight gain of > 3 pounds overnight or 5 pounds in 1 week. Weight currently stable. Buy a scale.   2. HTN: Well controlled. She reports soft BP at home. Decrease lisinopril as above to 5 mg daily. Continue Coreg and Lasix at current dose.   3. CLL: Per hematology.   4. Language barrier: Medical interpreter used.   Disposition: F/u with Dr. Rockey Situ in 1 month.  Current medicines  are reviewed at length with the patient today.  The patient did not have any concerns regarding medicines.  Melvern Banker PA-C 06/07/2016 11:32 AM     [facility] [address] [place] [address] [place], [place] [address] [phone]

## 2016-06-08 ENCOUNTER — Other Ambulatory Visit: Payer: Self-pay

## 2016-06-08 DIAGNOSIS — E875 Hyperkalemia: Secondary | ICD-10-CM

## 2016-06-08 LAB — BASIC METABOLIC PANEL
BUN/Creatinine Ratio: 23 (ref 9–23)
BUN: 31 mg/dL — AB (ref 6–24)
CALCIUM: 9.5 mg/dL (ref 8.7–10.2)
CO2: 26 mmol/L (ref 18–29)
CREATININE: 1.34 mg/dL — AB (ref 0.57–1.00)
Chloride: 98 mmol/L (ref 96–106)
GFR calc Af Amer: 52 mL/min/{1.73_m2} — ABNORMAL LOW (ref >59)
GFR, EST NON AFRICAN AMERICAN: 45 mL/min/{1.73_m2} — AB (ref >59)
GLUCOSE: 95 mg/dL (ref 65–99)
Potassium: 5.7 mmol/L — ABNORMAL HIGH (ref 3.5–5.2)
SODIUM: 137 mmol/L (ref 134–144)

## 2016-06-08 MED ORDER — FUROSEMIDE 20 MG PO TABS: 20.0000 mg | ORAL_TABLET | Freq: Every day | ORAL | 3 refills | Status: DC

## 2016-06-09 ENCOUNTER — Ambulatory Visit (HOSPITAL_BASED_OUTPATIENT_CLINIC_OR_DEPARTMENT_OTHER): Payer: BLUE CROSS/BLUE SHIELD | Admitting: Oncology

## 2016-06-09 ENCOUNTER — Encounter: Payer: Self-pay | Admitting: Physician Assistant

## 2016-06-09 ENCOUNTER — Other Ambulatory Visit
Admission: RE | Admit: 2016-06-09 | Discharge: 2016-06-09 | Disposition: A | Discharge status: Home / Self Care | Payer: BLUE CROSS/BLUE SHIELD | Source: Ambulatory Visit | Attending: Cardiovascular Disease | Admitting: Cardiovascular Disease

## 2016-06-09 ENCOUNTER — Other Ambulatory Visit: Payer: Self-pay

## 2016-06-09 ENCOUNTER — Telehealth: Payer: Self-pay | Admitting: Oncology

## 2016-06-09 ENCOUNTER — Telehealth: Payer: Self-pay | Admitting: Physician Assistant

## 2016-06-09 ENCOUNTER — Other Ambulatory Visit: Payer: No Typology Code available for payment source

## 2016-06-09 VITALS — BP 132/66 | HR 70 | Temp 97.8°F | Resp 18 | Ht 62.0 in | Wt 184.9 lb

## 2016-06-09 DIAGNOSIS — C9110 Chronic lymphocytic leukemia of B-cell type not having achieved remission: Secondary | ICD-10-CM | POA: Diagnosis not present

## 2016-06-09 DIAGNOSIS — E875 Hyperkalemia: Secondary | ICD-10-CM | POA: Diagnosis not present

## 2016-06-09 DIAGNOSIS — I509 Heart failure, unspecified: Secondary | ICD-10-CM | POA: Diagnosis not present

## 2016-06-09 LAB — COMPREHENSIVE METABOLIC PANEL
ALBUMIN: 4 g/dL (ref 3.5–5.0)
ALT: 17 U/L (ref 14–54)
AST: 24 U/L (ref 15–41)
Alkaline Phosphatase: 105 U/L (ref 38–126)
Anion gap: 3 — ABNORMAL LOW (ref 5–15)
BILIRUBIN TOTAL: 0.4 mg/dL (ref 0.3–1.2)
BUN: 20 mg/dL (ref 6–20)
CO2: 28 mmol/L (ref 22–32)
CREATININE: 0.98 mg/dL (ref 0.44–1.00)
Calcium: 8.9 mg/dL (ref 8.9–10.3)
Chloride: 105 mmol/L (ref 101–111)
GFR calc Af Amer: >60 mL/min (ref >60)
GLUCOSE: 106 mg/dL — AB (ref 65–99)
POTASSIUM: 4.5 mmol/L (ref 3.5–5.1)
Sodium: 136 mmol/L (ref 135–145)
TOTAL PROTEIN: 7.1 g/dL (ref 6.5–8.1)

## 2016-06-09 MED ORDER — POTASSIUM CHLORIDE ER 10 MEQ PO TBCR: 10.0000 meq | EXTENDED_RELEASE_TABLET | Freq: Every day | ORAL | 3 refills | Status: DC

## 2016-06-09 NOTE — Telephone Encounter (Signed)
Gave pt cal & avs °

## 2016-06-09 NOTE — Progress Notes (Signed)
Please call the patient. She can resume Lasix and KCl, though resume Lasix at 20 mg daily rather than 40 mg daily. KCl at 10 meq daily.

## 2016-06-09 NOTE — Telephone Encounter (Signed)
S/w interpreter Monia Pouch, [phone], on [employer] to review lab results and recommendations w/pt. S/w pt and daughter, Katherine Terrell, who both verbalized understanding for pt to take lasix 20mg  qd and add K+ 1mEq qd. She requests K+ prescription be submitted to [facility] as [facility] is closed today. Reviewed medications pt should take including decreased lisinopril dosage to 5mg  daily. Both pt and pt daughter, Katherine Terrell, verbalized understanding and are agreeable w/plan. They had no further questions at this time.

## 2016-06-09 NOTE — Progress Notes (Signed)
Hematology and Oncology Follow Up Visit  Katherine Terrell [id number] [birth year] 55 y.o. 06/09/2016 3:50 PM [employer]*   Principle Diagnosis: 55 year old woman with stage 0 CLL diagnosed in 2011. Presented with lymphocytosis and mild splenomegaly.  Current therapy: Observation and surveillance.  Interim History: Katherine Terrell presents today for a followup visit accompanied by her family have provided interpretation. Since her last visit, she was hospitalized last week for hypertension and congestive heart failure. During her hospitalization, it was noted that her white cell count was slightly elevated more than baseline. Her white cell count was 125,000. She was discharged in good condition and at this time feels reasonably well. She denied any shortness of breath or dyspnea on exertion at this time. She denied any lower extremity edema.  She is not reporting any constitutional symptoms at this time. She denied any fevers, chills or weight loss. She denied any bulky adenopathy.   She does not report any headaches, blurry vision, syncope or seizures. She does not report any chest pain, palpitation orthopnea. She does not report any cough or hemoptysis. He does not report any nausea, vomiting oral satiety. She does not report any constipation or diarrhea. She does not report any lymphadenopathy or petechiae. Rest of her review of systems unremarkable.  Medications: I have reviewed the patient's current medications.  Current Outpatient Prescriptions  Medication Sig Dispense Refill  . aspirin EC 81 MG tablet Take 1 tablet (81 mg total) by mouth daily. 30 tablet 0  . carvedilol (COREG) 6.25 MG tablet Take 1 tablet (6.25 mg total) by mouth 2 (two) times daily with a meal. 60 tablet 1  . furosemide (LASIX) 20 MG tablet Take 1 tablet (20 mg total) by mouth daily. 30 tablet 3  . levothyroxine (SYNTHROID, LEVOTHROID) 50 MCG tablet Take 1 tablet (50 mcg total) by  mouth daily before breakfast. 30 tablet 0  . lisinopril (PRINIVIL,ZESTRIL) 5 MG tablet Take 1 tablet (5 mg total) by mouth daily. 30 tablet 3  . potassium chloride (K-DUR) 10 MEQ tablet Take 1 tablet (10 mEq total) by mouth daily. 30 tablet 3   No current facility-administered medications for this visit.      Allergies:  Allergies  Allergen Reactions  . Tramadol Nausea And Vomiting    Past Medical History, Surgical history, Social history, and Family History were reviewed and updated.   Physical Exam: Blood pressure 132/66, pulse 70, temperature 97.8 F (36.6 C), temperature source Oral, resp. rate 18, height 5\' 2"  (1.575 m), weight 184 lb 14.4 oz (83.9 kg), SpO2 99 %. ECOG: 0 General appearance: A well-appearing woman appeared without distress. Head: Normocephalic, without obvious abnormality no oral ulcers or lesions. Neck: no adenopathy Lymph nodes: Cervical, supraclavicular, and axillary nodes normal. Heart:regular rate and rhythm, S1, S2 normal, no murmur, click, rub or gallop Lung:chest clear, no wheezing, rales, normal symmetric air entry Abdomin: soft, non-tender, without masses or organomegaly no shifting dullness or ascites. Extremities: No edema noted.    Lab Results: Lab Results  Component Value Date   WBC 125.4 (HH) 06/03/2016   HGB 9.2 (L) 06/03/2016   HCT 28.6 (L) 06/03/2016   MCV 84.9 06/03/2016   PLT 144 (L) 06/03/2016     Chemistry      Component Value Date/Time   NA 136 06/09/2016 0913   NA 137 06/07/2016 1213   NA 141 02/09/2016 1506   K 4.5 06/09/2016 0913   K 3.8 02/09/2016 1506   CL 105 06/09/2016 0913  CL 107 12/24/2012 1332   CO2 28 06/09/2016 0913   CO2 27 02/09/2016 1506   BUN 20 06/09/2016 0913   BUN 31 (H) 06/07/2016 1213   BUN 17.7 02/09/2016 1506   CREATININE 0.98 06/09/2016 0913   CREATININE 1.3 (H) 02/09/2016 1506      Component Value Date/Time   CALCIUM 8.9 06/09/2016 0913   CALCIUM 9.0 02/09/2016 1506   ALKPHOS 105  06/09/2016 0913   ALKPHOS 112 02/09/2016 1506   AST 24 06/09/2016 0913   AST 23 02/09/2016 1506   ALT 17 06/09/2016 0913   ALT 11 02/09/2016 1506   BILITOT 0.4 06/09/2016 0913   BILITOT 0.32 02/09/2016 1506        Impression and Plan:  55 year old woman with the following issues:  1. Stage 0 CLL presented with a lymphocytosis. She has been diagnosed and followed since 2011 and have not developed any symptomatology that requires intervention.  Her last CT scan obtained in August 2016 showed very little enlargement of her lymph nodes.  She is clinically asymptomatic at this time I do not see any indication for treatment. Indication for treatment were reviewed today including constitutional symptoms of fevers or chills or progressive weight loss. Palpable or painful adenopathy would be also reasonable to treat which she certainly does not have. Rapid increase in her white cell count would be also an indication to treat that her lymphocyte doubling time have been very slowly over the years.  I recommended continuing observation and surveillance and a follow-up visit in 4 months.  2. Congestive heart failure: Currently on Lasix and appears to be euvolemic.  3. Followup: Will be in 4 months.  Zola Button, MD 8/11/20173:50 PM

## 2016-07-10 ENCOUNTER — Telehealth (HOSPITAL_COMMUNITY): Payer: Self-pay | Admitting: *Deleted

## 2016-07-10 NOTE — Telephone Encounter (Signed)
Telephoned patient at home number no answer. Used interpreter Lavon Paganini

## 2016-07-14 ENCOUNTER — Encounter: Payer: Self-pay | Admitting: Cardiovascular Disease

## 2016-07-14 ENCOUNTER — Ambulatory Visit (INDEPENDENT_AMBULATORY_CARE_PROVIDER_SITE_OTHER): Payer: BLUE CROSS/BLUE SHIELD | Admitting: Cardiovascular Disease

## 2016-07-14 VITALS — BP 110/70 | HR 75 | Ht 62.0 in | Wt 180.5 lb

## 2016-07-14 DIAGNOSIS — I5022 Chronic systolic (congestive) heart failure: Secondary | ICD-10-CM

## 2016-07-14 MED ORDER — POTASSIUM CHLORIDE ER 10 MEQ PO TBCR: 10.0000 meq | EXTENDED_RELEASE_TABLET | Freq: Every day | ORAL | 11 refills | Status: DC

## 2016-07-14 MED ORDER — FUROSEMIDE 20 MG PO TABS: 20.0000 mg | ORAL_TABLET | Freq: Every day | ORAL | 11 refills | Status: DC

## 2016-07-14 MED ORDER — CARVEDILOL 6.25 MG PO TABS: 6.2500 mg | ORAL_TABLET | Freq: Two times a day (BID) | ORAL | 11 refills | Status: DC

## 2016-07-14 MED ORDER — LISINOPRIL 5 MG PO TABS: 5.0000 mg | ORAL_TABLET | Freq: Every day | ORAL | 11 refills | Status: DC

## 2016-07-14 NOTE — Progress Notes (Signed)
Cardiology Office Note   Date:  07/14/2016   ID:  Katherine Terrell, DOB [birth year], MRN [id number]  PCP:  [facility]  Cardiologist:   Kathlyn Sacramento, MD   Chief Complaint  Patient presents with  . otehr    1 month f/u c/o coughing. Pt needs Potassium Rx.  Meds reviewed verbally with pt.      History of Present Illness: Katherine Terrell is a 55 y.o. female who presents for A follow-up visit regarding chronic systolic heart failure due to nonischemic cardiomyopathy. Cardiac catheterization in February 2017 showed no significant coronary artery disease with ejection fraction of 45%. She has known history of CLL, hypertension and hyperlipidemia. She has been doing well and reports improved dyspnea with orthopnea, PND or leg edema. She does complain of dry cough and rare episodes of sharp chest pain. She has been taking her medications regularly.    Past Medical History:  Diagnosis Date  . Cardiomyopathy    echo: (11/11) EF 25-30% with diffuse hypokinesis, mild left atrial enlargement. 11/11 HIV and ANA negative. TSH normal. LHC (1/12): EF 50-55% no angiographic CAD. Echo (2/12): EF 55-60% moderate LV hypertrophy, grade I diastolic dysfunction, mild MR, normal RV  . Chronic lymphocytic leukemia ([place])    followed by Dr. Ralene Ok  . Hyperlipidemia   . Hypertension   . Leukemia ([place])   . Multinodular goiter    with hypothyroidism    Past Surgical History:  Procedure Laterality Date  . CARDIAC CATHETERIZATION  2012   @ [place]  . CARDIAC CATHETERIZATION N/A 12/13/2015   Procedure: Left Heart Cath and Coronary Angiography;  Surgeon: Larey Dresser, MD;  Location: [facility];  Service: Cardiovascular;  Laterality: N/A;     Current Outpatient Prescriptions  Medication Sig Dispense Refill  . aspirin EC 81 MG tablet Take 1 tablet (81 mg total) by mouth daily. 30 tablet 0  . carvedilol (COREG) 6.25 MG tablet Take 1 tablet (6.25 mg total) by mouth 2  (two) times daily with a meal. 60 tablet 11  . furosemide (LASIX) 20 MG tablet Take 1 tablet (20 mg total) by mouth daily. 30 tablet 11  . levothyroxine (SYNTHROID, LEVOTHROID) 50 MCG tablet Take 1 tablet (50 mcg total) by mouth daily before breakfast. 30 tablet 0  . lisinopril (PRINIVIL,ZESTRIL) 5 MG tablet Take 1 tablet (5 mg total) by mouth daily. 30 tablet 11  . potassium chloride (K-DUR) 10 MEQ tablet Take 1 tablet (10 mEq total) by mouth daily. 30 tablet 11   No current facility-administered medications for this visit.     Allergies:   Tramadol    Social History:  The patient  reports that she has never smoked. She has never used smokeless tobacco. She reports that she does not drink alcohol or use drugs.   Family History:  The patient's family history includes Diabetes in her sister; Heart attack (age of onset: 5) in her mother.    ROS:  Please see the history of present illness.   Otherwise, review of systems are positive for none.   All other systems are reviewed and negative.    PHYSICAL EXAM: VS:  BP 110/70 (BP Location: Left Arm, Patient Position: Sitting, Cuff Size: Normal)   Pulse 75   Ht 5\' 2"  (1.575 m)   Wt 180 lb 8 oz (81.9 kg)   BMI 33.01 kg/m  , BMI Body mass index is 33.01 kg/m. GEN: Well nourished, well developed, in no acute distress  HEENT: normal  Neck: no JVD, carotid bruits, or masses Cardiac: RRR; no  rubs, or gallops,no edema . One out of 6 systolic ejection murmur in the aortic area Respiratory:  clear to auscultation bilaterally, normal work of breathing GI: soft, nontender, nondistended, + BS MS: no deformity or atrophy  Skin: warm and dry, no rash Neuro:  Strength and sensation are intact Psych: euthymic mood, full affect   EKG:  EKG is not ordered today.    Recent Labs: 11/27/2015: TSH 2.087 06/03/2016: B Natriuretic Peptide 1,788.2; Hemoglobin 9.2; Platelets 144 06/04/2016: Magnesium 2.2 06/09/2016: ALT 17; BUN 20; Creatinine, Ser 0.98;  Potassium 4.5; Sodium 136    Lipid Panel    Component Value Date/Time   CHOL 157 11/29/2015 0858   TRIG 116 11/29/2015 0858   HDL 30 (L) 11/29/2015 0858   CHOLHDL 5.2 11/29/2015 0858   VLDL 23 11/29/2015 0858   LDLCALC 104 (H) 11/29/2015 0858      Wt Readings from Last 3 Encounters:  07/14/16 180 lb 8 oz (81.9 kg)  06/09/16 184 lb 14.4 oz (83.9 kg)  06/07/16 181 lb 12 oz (82.4 kg)        No flowsheet data found.    ASSESSMENT AND PLAN:  1.  Chronic systolic heart failure: Due to nonischemic cardiomyopathy with most recent ejection fraction of 45%. Continue treatment with carvedilol and lisinopril. Not able to up titrate due to relatively low blood pressure. She appears to be euvolemic on small dose furosemide 20 mg once daily. We provided her with instructions about low sodium diet.  2. Essential hypertension: Blood pressure is controlled.  Disposition:   FU in 3 months  Signed,  Kathlyn Sacramento, MD  07/14/2016 3:47 PM    [place]

## 2016-07-14 NOTE — Patient Instructions (Addendum)
Medication Instructions:  Your physician recommends that you continue on your current medications as directed. Please refer to the Current Medication list given to you today.   Labwork: none  Testing/Procedures: none  Follow-Up: Your physician recommends that you schedule a follow-up appointment in: 3 months with Katherine Faith, PA-C    Any Other Special Instructions Will Be Listed Below (If Applicable).     If you need a refill on your cardiac medications before your next appointment, please call your pharmacy.   Plan de alimentacin con bajo contenido de sodio (Low-Sodium Eating Plan) El sodio aumenta la presin arterial y hace que el cuerpo retenga lquidos. El consumo de alimentos con menos sodio ayuda a Armed forces technical officer presin arterial, a Forensic psychologist y a Tour manager, el hgado y los riones. Agregar sal (cloruro de sodio) a los alimentos aumenta el aporte de [place]. La mayor parte del sodio proviene de los alimentos enlatados, envasados y congelados. La pizza, la comida rpida y la comida de los restaurantes tambin contienen mucho sodio. Aunque usted tome medicamentos para bajar la presin arterial o reducir el lquido del cuerpo, es importante que disminuya el aporte de sodio de los alimentos. EN QU CONSISTE EL PLAN? La [employer] de las personas deberan limitar la ingesta de sodio a 2300mg  por Training and development officer. El mdico le recomienda que limite su consumo de sodio a __________ [employer].  QU DEBO SABER ACERCA DE ESTE PLAN DE [place]? Para el plan de alimentacin con bajo contenido de sodio, debe seguir estas pautas generales:  Elija alimentos con un valor porcentual diario de sodio de menos del 5% (segn se indica en la etiqueta).  Use hierbas o aderezos sin sal, en lugar de sal de mesa o sal marina.  Consulte al mdico o farmacutico antes de usar sustitutos de la sal.  Coma alimentos frescos.  Coma ms frutas y verduras.  Limite las verduras enlatadas. Si las  consume, enjuguelas bien para disminuir el sodio.  Limite el consumo de queso a 1onza (28g) por Training and development officer.  Coma productos con bajo contenido de sodio, cuya etiqueta suele decir "bajo contenido de sodio" o "sin agregado de sal".  Evite alimentos que contengan glutamato monosdico (MSG), que a veces se agrega a la comida [place] y a algunos alimentos enlatados.  Consulte las etiquetas de los alimentos (etiquetas de informacin nutricional) para saber cunto sodio contiene una porcin.  Consuma ms comida casera y menos de restaurante, de buf y comida rpida.  Cuando coma en un restaurante, pida que preparen su comida con menos sal o, en lo posible, sin nada de sal. CMO LEO LA INFORMACIN SOBRE EL SODIO EN LAS ETIQUETAS DE LOS ALIMENTOS? La etiqueta de informacin nutricional indica la cantidad de sodio en una porcin de alimento. Si come ms de una porcin, debe multiplicar la cantidad indicada de sodio por la cantidad de porciones. Las etiquetas de los alimentos tambin pueden indicar lo siguiente:  Sin sodio: menos de 5mg  por porcin.  Cantidad muy baja de sodio: 35mg  o menos por porcin.  Cantidad baja de sodio: 140mg  o menos por porcin.  Menor cantidad de sodio: 50% menos de sodio en una porcin. Por ejemplo, si un alimento generalmente contiene 300 mg de sodio se modifica para ser [employer], tendr 150 mg de sodio.  Sodio reducido: 25% menos de Agricultural consultant. Por ejemplo, si un alimento que por lo general contiene 400mg  de sodio se modifica para convertirse en un alimento de sodio reducido,  tendr 300mg  de sodio. QU ALIMENTOS PUEDO COMER? Cereales Cereales con bajo contenido de sodio, como [place], arroz y trigo [place], y trigo triturado. Galletas con bajo contenido de [place]. Arroz y pastas sin sal. Pan con bajo contenido de [place].  Verduras Verduras frescas o congeladas. Verduras enlatadas con bajo contenido de sodio o reducido de sodio. Pasta y salsa  de tomate con contenido bajo o [place]. Jugos de tomate y verduras con contenido bajo o reducido de sodio.  Lambert Mody Frutas frescas, congeladas y IT sales professional. Jugo de frutas.  Carnes y otros productos con protenas Atn y salmn enlatado con bajo contenido de [place]. Carne de vaca o ave, pescado y frutos de mar frescos o congelados. Cordero. Frutos secos sin sal. Lentejas, frijoles y guisantes secos, sin sal agregada. Frijoles enlatados sin sal. Sopas caseras sin sal. Huevos.  Lcteos Leche. Leche de soja. Queso ricota. Quesos con contenido bajo o reducido de sodio. Yogur.  Condimentos Hierbas y especias frescas y secas. Aderezos sin sal. Cebolla y ajo en polvo. Variedades de [place] y ketchup con bajo contenido de sodio. Rbano picante fresco o refrigerado. Jugo de limn.  Grasas y aceites Aderezos para ensalada con contenido reducido de [place]. Mantequilla sin sal.  Otros Palomitas de maz y pretzels sin sal.  Los artculos mencionados arriba pueden no ser [employer] de las bebidas o los alimentos recomendados. Comunquese con el nutricionista para conocer ms opciones. QU ALIMENTOS NO SE RECOMIENDAN? Cereales  Cereales instantneos para comer caliente. Mezclas para bizcochos, panqueques y rellenos de pan. Crutones. Mezclas para pastas o arroz con condimento. Envases comerciales de sopa de fideos. Macarrones con queso envasados o congelados. Harina leudante. Galletas saladas comunes. Verduras Verduras enlatadas comunes. Pasta y salsa de tomate en lata comunes. Jugos comunes de tomate y de verduras. Verduras Surveyor, minerals. Papas fritas saladas. Aceitunas. Pepinillos. Salsas. Chucrut. Salsa. Carnes y otros productos con protenas Carne de vaca, pescado o frutos de mar que est salada, [place], [place], condimentada con especias o con pickles. Panceta, jamn, salchichas, perros calientes, carne curada, carne picada (carne envasada de buey) y embutidos. Cerdo salado. Cecina o  charqui. Arenque en escabeche. Anchoas, atn enlatado comn y sardinas. Frutos secos con sal. Ines Bloomer para untar y quesos procesados. Requesn. Queso azul y cottage. Suero de [place].  Condimentos Sal de cebolla y ajo, sal condimentada, sal de mesa y sal marina. Salsas en lata y envasadas. Salsa Worcestershire. Salsa trtara. Salsa barbacoa. Salsa teriyaki. Salsa de soja, incluso la que tiene contenido reducido de [place]. Salsa de carne. Salsa de pescado. Salsa de [place]. Salsa rosada. Rbano picante envasado. Ketchup y mostaza comunes. Saborizantes y tiernizantes para carne. Caldo en cubitos. Salsa picante. Salsa tabasco. Adobos. Aderezos para tacos. Salsas. Grasas y aceites Aderezos comunes para ensalada. [place] con sal. Margarina. Mantequilla clarificada. Grasa de panceta.  Otros Nachos y papas fritas envasadas. Maz inflado y frituras de maz. Palomitas de maz y pretzels con sal. Sopas enlatadas o en polvo. Pizza. Pasteles y entradas congeladas.  Los artculos mencionados arriba pueden no ser [employer] de las bebidas y los alimentos que se Higher education careers adviser. Comunquese con el nutricionista para obtener ms informacin.   Esta informacin no tiene Marine scientist el consejo del mdico. Asegrese de hacerle al mdico cualquier pregunta que tenga.   Document Released: 10/16/2005 Document Revised: 11/06/2014 Elsevier Interactive Patient Education [employer].

## 2016-07-17 ENCOUNTER — Telehealth: Payer: Self-pay | Admitting: Cardiovascular Disease

## 2016-07-17 NOTE — Telephone Encounter (Signed)
Pt was given written prescriptions to take to [facility] at 9/15 OV.  Attempted to contact pt. No answer and phone is not set up to accept VM messages. Attempted to contact pt daughter, Levada Dy, (on [place]). No answer and phone is not set up to accept VM messages.

## 2016-07-17 NOTE — Telephone Encounter (Signed)
° °*  STAT* If patient is at the pharmacy, call can be transferred to refill team.   1. Which medications need to be refilled? (please list name of each medication and dose if known)  Lisinopril  Carvedilol  Furosamide  Potassium   2. Which pharmacy/location (including street and city if local pharmacy) is medication to be sent to? [facility]   3. Do they need a 30 day or 90 day supply?  90 day

## 2016-07-20 NOTE — Telephone Encounter (Signed)
Attempted to contact pt. No answer, no VM on pt phone

## 2016-10-04 ENCOUNTER — Emergency Department (HOSPITAL_COMMUNITY): Payer: Self-pay

## 2016-10-04 ENCOUNTER — Encounter (HOSPITAL_COMMUNITY): Payer: Self-pay | Admitting: Emergency Medicine

## 2016-10-04 ENCOUNTER — Emergency Department (HOSPITAL_COMMUNITY)
Admission: EM | Admit: 2016-10-04 | Discharge: 2016-10-04 | Disposition: A | Discharge status: Home / Self Care | Payer: Self-pay | Attending: Emergency Medicine | Admitting: Emergency Medicine

## 2016-10-04 DIAGNOSIS — I5021 Acute systolic (congestive) heart failure: Secondary | ICD-10-CM | POA: Insufficient documentation

## 2016-10-04 DIAGNOSIS — R197 Diarrhea, unspecified: Secondary | ICD-10-CM | POA: Insufficient documentation

## 2016-10-04 DIAGNOSIS — R112 Nausea with vomiting, unspecified: Secondary | ICD-10-CM | POA: Insufficient documentation

## 2016-10-04 DIAGNOSIS — Z79899 Other long term (current) drug therapy: Secondary | ICD-10-CM | POA: Insufficient documentation

## 2016-10-04 DIAGNOSIS — E039 Hypothyroidism, unspecified: Secondary | ICD-10-CM | POA: Insufficient documentation

## 2016-10-04 DIAGNOSIS — B349 Viral infection, unspecified: Secondary | ICD-10-CM | POA: Insufficient documentation

## 2016-10-04 DIAGNOSIS — R69 Illness, unspecified: Secondary | ICD-10-CM

## 2016-10-04 DIAGNOSIS — I110 Hypertensive heart disease with heart failure: Secondary | ICD-10-CM | POA: Insufficient documentation

## 2016-10-04 DIAGNOSIS — Z7982 Long term (current) use of aspirin: Secondary | ICD-10-CM | POA: Insufficient documentation

## 2016-10-04 DIAGNOSIS — J111 Influenza due to unidentified influenza virus with other respiratory manifestations: Secondary | ICD-10-CM

## 2016-10-04 DIAGNOSIS — R05 Cough: Secondary | ICD-10-CM

## 2016-10-04 DIAGNOSIS — R059 Cough, unspecified: Secondary | ICD-10-CM

## 2016-10-04 LAB — URINALYSIS, ROUTINE W REFLEX MICROSCOPIC
Bacteria, UA: NONE SEEN
Bilirubin Urine: NEGATIVE
Glucose, UA: NEGATIVE mg/dL
KETONES UR: NEGATIVE mg/dL
Leukocytes, UA: NEGATIVE
Nitrite: NEGATIVE
PH: 5 (ref 5.0–8.0)
PROTEIN: NEGATIVE mg/dL
Specific Gravity, Urine: 1.015 (ref 1.005–1.030)

## 2016-10-04 LAB — POC URINE PREG, ED: Preg Test, Ur: NEGATIVE

## 2016-10-04 LAB — COMPREHENSIVE METABOLIC PANEL
ALT: 17 U/L (ref 14–54)
AST: 21 U/L (ref 15–41)
Albumin: 4 g/dL (ref 3.5–5.0)
Alkaline Phosphatase: 100 U/L (ref 38–126)
Anion gap: 7 (ref 5–15)
BUN: 18 mg/dL (ref 6–20)
CHLORIDE: 107 mmol/L (ref 101–111)
CO2: 26 mmol/L (ref 22–32)
Calcium: 8.7 mg/dL — ABNORMAL LOW (ref 8.9–10.3)
Creatinine, Ser: 1.08 mg/dL — ABNORMAL HIGH (ref 0.44–1.00)
GFR, EST NON AFRICAN AMERICAN: 57 mL/min — AB (ref >60)
Glucose, Bld: 114 mg/dL — ABNORMAL HIGH (ref 65–99)
POTASSIUM: 4.5 mmol/L (ref 3.5–5.1)
SODIUM: 140 mmol/L (ref 135–145)
Total Bilirubin: 0.8 mg/dL (ref 0.3–1.2)
Total Protein: 6.7 g/dL (ref 6.5–8.1)

## 2016-10-04 LAB — CBC WITH DIFFERENTIAL/PLATELET
BLASTS: 0 %
Band Neutrophils: 0 %
Basophils Absolute: 0 10*3/uL (ref 0.0–0.1)
Basophils Relative: 0 %
EOS PCT: 1 %
Eosinophils Absolute: 1.2 10*3/uL — ABNORMAL HIGH (ref 0.0–0.7)
HCT: 29.5 % — ABNORMAL LOW (ref 36.0–46.0)
Hemoglobin: 8.9 g/dL — ABNORMAL LOW (ref 12.0–15.0)
LYMPHS ABS: 114.2 10*3/uL — AB (ref 0.7–4.0)
LYMPHS PCT: 96 %
MCH: 27.3 pg (ref 26.0–34.0)
MCHC: 30.2 g/dL (ref 30.0–36.0)
MCV: 90.5 fL (ref 78.0–100.0)
MONO ABS: 0 10*3/uL — AB (ref 0.1–1.0)
MONOS PCT: 0 %
Metamyelocytes Relative: 0 %
Myelocytes: 0 %
NEUTROS ABS: 3.6 10*3/uL (ref 1.7–7.7)
NEUTROS PCT: 3 %
NRBC: 0 /100{WBCs}
PLATELETS: 177 10*3/uL (ref 150–400)
Promyelocytes Absolute: 0 %
RBC: 3.26 MIL/uL — AB (ref 3.87–5.11)
RDW: 16.6 % — ABNORMAL HIGH (ref 11.5–15.5)
WBC: 119 10*3/uL — AB (ref 4.0–10.5)

## 2016-10-04 LAB — I-STAT TROPONIN, ED: TROPONIN I, POC: 0.03 ng/mL (ref 0.00–0.08)

## 2016-10-04 LAB — PATHOLOGIST SMEAR REVIEW

## 2016-10-04 LAB — LIPASE, BLOOD: LIPASE: 21 U/L (ref 11–51)

## 2016-10-04 MED ORDER — SODIUM CHLORIDE 0.9 % IV BOLUS (SEPSIS): 1000.0000 mL | Freq: Once | INTRAVENOUS | Status: DC

## 2016-10-04 MED ORDER — PROMETHAZINE-DM 6.25-15 MG/5ML PO SYRP: 5.0000 mL | ORAL_SOLUTION | Freq: Four times a day (QID) | ORAL | 0 refills | Status: DC | PRN

## 2016-10-04 MED ORDER — SODIUM CHLORIDE 0.9 % IV BOLUS (SEPSIS)
500.0000 mL | Freq: Once | INTRAVENOUS | Status: AC
Start: 2016-10-04 — End: 2016-10-04
  Administered 2016-10-04: 500 mL via INTRAVENOUS

## 2016-10-04 MED ORDER — GI COCKTAIL ~~LOC~~
30.0000 mL | Freq: Once | ORAL | Status: AC
  Administered 2016-10-04: 30 mL via ORAL
  Filled 2016-10-04: qty 30

## 2016-10-04 MED ORDER — IBUPROFEN 600 MG PO TABS: 600.0000 mg | ORAL_TABLET | Freq: Four times a day (QID) | ORAL | 0 refills | Status: DC | PRN

## 2016-10-04 MED ORDER — ONDANSETRON HCL 4 MG/2ML IJ SOLN
4.0000 mg | Freq: Once | INTRAMUSCULAR | Status: AC
  Administered 2016-10-04: 4 mg via INTRAVENOUS
  Filled 2016-10-04: qty 2

## 2016-10-04 MED ORDER — KETOROLAC TROMETHAMINE 15 MG/ML IJ SOLN
15.0000 mg | Freq: Once | INTRAMUSCULAR | Status: AC
  Administered 2016-10-04: 15 mg via INTRAVENOUS
  Filled 2016-10-04: qty 1

## 2016-10-04 NOTE — ED Triage Notes (Addendum)
Per pt/family-states cough for 2 weeks-productive cough-states nasal and chest congestion and body aches for a few days

## 2016-10-04 NOTE — ED Provider Notes (Signed)
[facility] DEPT Provider Note   CSN: [id number] Arrival date & time: 10/04/16  [id number]  By signing my name below, I, Katherine Terrell, attest that this documentation has been prepared under the direction and in the presence of [name], [place]. Electronically Signed: Sonum Terrell, Education administrator. 10/04/16. 10:40 AM.  History   Chief Complaint Chief Complaint  Patient presents with  . URI     The history is provided by the patient and a relative. The history is limited by a language barrier. A language interpreter was used.     HPI Comments: Katherine Terrell is a 55 y.o. female with past medical history of HLD, HTN, CHF, chronic lymphocytic leukemia who presents to the Emergency Department complaining of persistent cough productive of white sputum for the past 2 weeks. She reports having a fever 1 week ago. She has associated SOB, CP, intermittent nausea, vomiting (2 episodes last night), and epigastric pain. The chest pain is a dull ache. Denies chest pain currently. States she feels short of breath after a coughing fit but right now it feels like she cannot breathe quite at baseline. She currently complains of epigastric pain and nausea. Endorses 2 episodes of  NBNB emesis yesterday, none so far today. She states her stools have been loose and watery. Denies BRBPR or black tarry stools. States she feels tired and weak. She states her appetite is poor and she has had poor PO intake. She states she sometimes has abdominal pain/pressure when urinating. She denies dysuria or urinary frequency/urgency. Denies leg pain or swelling. Denies night sweats. Denies unintentional weight loss.  Past Medical History:  Diagnosis Date  . Cardiomyopathy    echo: (11/11) EF 25-30% with diffuse hypokinesis, mild left atrial enlargement. 11/11 HIV and ANA negative. TSH normal. LHC (1/12): EF 50-55% no angiographic CAD. Echo (2/12): EF 55-60% moderate LV hypertrophy, grade I diastolic dysfunction, mild MR, normal RV  . Chronic  lymphocytic leukemia ([place])    followed by Dr. Ralene Ok  . Hyperlipidemia   . Hypertension   . Leukemia ([place])   . Multinodular goiter    with hypothyroidism    Patient Active Problem List   Diagnosis Date Noted  . Hypoxia   . Acute systolic CHF (congestive heart failure) ([place]) 11/30/2015  . Sepsis ([place]) 11/28/2015  . CAP (community acquired pneumonia) 11/27/2015  . Ventricular tachycardia, non-sustained ([place]) 11/27/2015  . Hyperlipidemia 11/27/2015  . Essential hypertension 11/27/2015  . Influenza B 11/27/2015  . Dehydration 11/27/2015  . Chronic diastolic heart failure ([place]) 11/27/2015  . CLL (chronic lymphocytic leukemia) ([place]) 12/04/2011  . Hypothyroidism 12/23/2010  . CHRONIC SYSTOLIC HEART FAILURE [id number]    Past Surgical History:  Procedure Laterality Date  . CARDIAC CATHETERIZATION  2012   @ [facility]  . CARDIAC CATHETERIZATION N/A 12/13/2015   Procedure: Left Heart Cath and Coronary Angiography;  Surgeon: Larey Dresser, MD;  Location: [facility];  Service: Cardiovascular;  Laterality: N/A;    OB History    Gravida Para Term Preterm AB Living   3 3 3     3    SAB TAB Ectopic Multiple Live Births                   Home Medications    Prior to Admission medications   Medication Sig Start Date End Date Taking? Authorizing Provider  aspirin EC 81 MG tablet Take 1 tablet (81 mg total) by mouth daily. 06/04/16   Kelvin Cellar, MD  carvedilol (COREG) 6.25  MG tablet Take 1 tablet (6.25 mg total) by mouth 2 (two) times daily with a meal. 07/14/16   Wellington Hampshire, MD  furosemide (LASIX) 20 MG tablet Take 1 tablet (20 mg total) by mouth daily. 07/14/16   Wellington Hampshire, MD  levothyroxine (SYNTHROID, LEVOTHROID) 50 MCG tablet Take 1 tablet (50 mcg total) by mouth daily before breakfast. 02/04/16   Larey Dresser, MD  lisinopril (PRINIVIL,ZESTRIL) 5 MG tablet Take 1 tablet (5 mg total) by mouth daily. 07/14/16   Wellington Hampshire, MD  potassium chloride  (K-DUR) 10 MEQ tablet Take 1 tablet (10 mEq total) by mouth daily. 07/14/16 10/12/16  Wellington Hampshire, MD    Family History Family History  Problem Relation Age of Onset  . Heart attack Mother 69  . Diabetes Sister     Social History Social History  Substance Use Topics  . Smoking status: Never Smoker  . Smokeless tobacco: Never Used  . Alcohol use No     Allergies   Tramadol   Review of Systems Review of Systems  Constitutional: Positive for fatigue and fever.  Respiratory: Positive for cough and shortness of breath.   Cardiovascular: Positive for chest pain. Negative for leg swelling.  Gastrointestinal: Positive for nausea and vomiting.  Genitourinary: Negative for dysuria.  Musculoskeletal: Positive for arthralgias.  All other systems reviewed and are negative.    Physical Exam Updated Vital Signs BP 130/75 (BP Location: Right Arm)   Pulse 80   Temp 97.8 F (36.6 C) (Oral)   Resp 14   Wt 181 lb 9 oz (82.4 kg)   SpO2 98%   BMI 33.21 kg/m   Physical Exam  Constitutional: She is oriented to person, place, and time.  Appears uncomfortable, slightly pale, NAD  HENT:  Right Ear: External ear normal.  Left Ear: External ear normal.  Nose: Nose normal.  Mouth/Throat: No oropharyngeal exudate.  MM dry  Eyes: Conjunctivae and EOM are normal. Pupils are equal, round, and reactive to light.  Neck: Normal range of motion. Neck supple.  No meningismus  Cardiovascular: Normal rate, regular rhythm, normal heart sounds and intact distal pulses.   Pulmonary/Chest: Effort normal and breath sounds normal. No respiratory distress. She has no wheezes. She has no rales.  Abdominal: Soft. Bowel sounds are normal. She exhibits no distension. There is tenderness. There is no rebound.  Epigastric tenderness without rebound or guarding No CVA tenderness  Musculoskeletal: She exhibits no edema.  No LE edema  Lymphadenopathy:    She has no cervical adenopathy.    She has no  axillary adenopathy.       Right: No inguinal and no supraclavicular adenopathy present.       Left: No inguinal and no supraclavicular adenopathy present.  Neurological: She is alert and oriented to person, place, and time. No cranial nerve deficit.  Skin: Skin is warm and dry.  Psychiatric: She has a normal mood and affect.  Nursing note and vitals reviewed.    ED Treatments / Results  DIAGNOSTIC STUDIES: Oxygen Saturation is 98% on RA, normal by my interpretation.    COORDINATION OF CARE: 10:38 AM Discussed treatment plan with pt at bedside and pt agreed to plan.    Labs (all labs ordered are listed, but only abnormal results are displayed) Labs Reviewed  COMPREHENSIVE METABOLIC PANEL - Abnormal; Notable for the following:       Result Value   Glucose, Bld 114 (*)    Creatinine, Ser 1.08 (*)  Calcium 8.7 (*)    GFR calc non Af Amer 57 (*)    All other components within normal limits  CBC WITH DIFFERENTIAL/PLATELET - Abnormal; Notable for the following:    WBC 119.0 (*)    RBC 3.26 (*)    Hemoglobin 8.9 (*)    HCT 29.5 (*)    RDW 16.6 (*)    Lymphs Abs 114.2 (*)    Monocytes Absolute 0.0 (*)    Eosinophils Absolute 1.2 (*)    All other components within normal limits  URINALYSIS, ROUTINE W REFLEX MICROSCOPIC - Abnormal; Notable for the following:    Hgb urine dipstick MODERATE (*)    Squamous Epithelial / LPF 0-5 (*)    All other components within normal limits  LIPASE, BLOOD  PATHOLOGIST SMEAR REVIEW  POC URINE PREG, ED  I-STAT TROPOININ, ED    EKG  EKG Interpretation None       Radiology Dg Chest 2 View  Result Date: 10/04/2016 CLINICAL DATA:  55 year old female with cough for 2 weeks. Increased congestion and shortness of breath recently. Initial encounter. EXAM: CHEST  2 VIEW COMPARISON:  06/08/16 and earlier. FINDINGS: Chronic cardiomegaly is stable. Other mediastinal contours are within normal limits. Visualized tracheal air column is within  normal limits. No pneumothorax or pleural effusion. Chronic increased interstitial markings, but less pronounced than in 07-05-23. Still, the interstitial opacity appears to be slightly above baseline. No consolidation or confluent pulmonary opacity. No acute osseous abnormality identified. IMPRESSION: 1. Chronic cardiomegaly. Mild pulmonary interstitial edema suspected today, but not as pronounced on 06/08/2016. No pleural effusion. 2. Acute viral/ atypical respiratory infection is difficult to exclude. No focal pneumonia. Electronically Signed   By: Genevie Ann M.D.   On: 10/04/2016 10:24    Procedures Procedures (including critical care time)  Medications Ordered in ED Medications  ondansetron (ZOFRAN) injection 4 mg (4 mg Intravenous Given 10/04/16 1105)  gi cocktail (Maalox,Lidocaine,Donnatal) (30 mLs Oral Given 10/04/16 1105)  sodium chloride 0.9 % bolus 500 mL (0 mLs Intravenous Stopped 10/04/16 1227)  ketorolac (TORADOL) 15 MG/ML injection 15 mg (15 mg Intravenous Given 10/04/16 1226)     Initial Impression / Assessment and Plan / ED Course  I have reviewed the triage vital signs and the nursing notes.  Pertinent labs & imaging results that were available during my care of the patient were reviewed by me and considered in my medical decision making (see chart for details).  Clinical Course    Pt is an 55 y.o. female with multiple significant medical co-morbidities including CHF, h/o CLL followed by oncology with no current intervention, HTN, HLD presenting with complaints of URI symptoms over the past two weeks. Fever last week at home, unknown Tmax, afebrile today. She is also complaining of chest pain, SOB likely secondary to her cough. However, though she states her chest is pain free currently she does still endorse some subjective difficulty breathing. On exam she is moving air well with no increased WOB or tachypnea, lungs CTAB, no hypoxia. She does complain of epigastric pain currently and  is quite tender. Nauseated now but no emesis today. Likely viral etiology. With her poor PO intake and complaints of abdominal pain with n/v/d we will check a CBC, CMP, and lipase. Given the epigastrica pain with chest complaints and her history I will check an EKG and Troponin though suspicion for ACS is low. Doubt PE. Doubt dissection. She does not appear fluid overloaded. CXR was ordered in triage and reveals  some chronic findings and mild interstitial edema but no acute pleural effusion. No focal pneumonia visualized. She does appear dry on exam and endorses poor PO intake at home with high output. Will hydrate cautiously given h/o CHF.  12:50 PM CBC is at baseline. CMP lipase negative. UA with moderate hemoglobin no infection. EKG similar to prior. Troponin negative. Pt feels much improved after meds and fluids. She is tolerating PO. Repeat abdominal exam benign. Likely viral etiology. She has oncology follow up this month. I have discussed return precautions. Will give supportive meds for home. Instructed close PCP follow up. Strict ER return precautions given.  Final Clinical Impressions(s) / ED Diagnoses   Final diagnoses:  Influenza-like illness  Viral syndrome  Nausea, vomiting and diarrhea  Cough    New Prescriptions New Prescriptions   IBUPROFEN (ADVIL,MOTRIN) 600 MG TABLET    Take 1 tablet (600 mg total) by mouth every 6 (six) hours as needed.   PROMETHAZINE-DEXTROMETHORPHAN (PROMETHAZINE-DM) 6.25-15 MG/5ML SYRUP    Take 5 mLs by mouth 4 (four) times daily as needed for cough.   I personally performed the services described in this documentation, which was scribed in my presence. The recorded information has been reviewed and is accurate.    Anne Ng, PA-C 10/04/16 1253    Milton Ferguson, MD 10/04/16 [phone]

## 2016-10-04 NOTE — ED Notes (Signed)
Bed: WA21 Expected date:  Expected time:  Means of arrival:  Comments: Hold for triage 7 

## 2016-10-04 NOTE — Discharge Instructions (Signed)
Your labs were reassuring. You likely have a virus. Take medication as prescribed. Follow up with your primary care provider and oncologist. Return to the ER for new or worsening symptoms.

## 2016-10-04 NOTE — ED Notes (Signed)
Bed: WTR7 Expected date:  Expected time:  Means of arrival:  Comments: 

## 2016-10-05 ENCOUNTER — Encounter (HOSPITAL_COMMUNITY): Payer: Self-pay | Admitting: *Deleted

## 2016-10-05 ENCOUNTER — Inpatient Hospital Stay (HOSPITAL_COMMUNITY)
Admission: EM | Admit: 2016-10-05 | Discharge: 2016-10-11 | DRG: 292 | Disposition: A | Discharge status: Home / Self Care | Payer: Self-pay | Attending: Family Medicine | Admitting: Family Medicine

## 2016-10-05 ENCOUNTER — Emergency Department (HOSPITAL_COMMUNITY): Payer: Self-pay

## 2016-10-05 DIAGNOSIS — C9110 Chronic lymphocytic leukemia of B-cell type not having achieved remission: Secondary | ICD-10-CM | POA: Diagnosis present

## 2016-10-05 DIAGNOSIS — Z8249 Family history of ischemic heart disease and other diseases of the circulatory system: Secondary | ICD-10-CM

## 2016-10-05 DIAGNOSIS — Z833 Family history of diabetes mellitus: Secondary | ICD-10-CM

## 2016-10-05 DIAGNOSIS — E875 Hyperkalemia: Secondary | ICD-10-CM | POA: Diagnosis present

## 2016-10-05 DIAGNOSIS — I110 Hypertensive heart disease with heart failure: Principal | ICD-10-CM | POA: Diagnosis present

## 2016-10-05 DIAGNOSIS — I420 Dilated cardiomyopathy: Secondary | ICD-10-CM | POA: Diagnosis present

## 2016-10-05 DIAGNOSIS — I440 Atrioventricular block, first degree: Secondary | ICD-10-CM | POA: Diagnosis present

## 2016-10-05 DIAGNOSIS — R109 Unspecified abdominal pain: Secondary | ICD-10-CM

## 2016-10-05 DIAGNOSIS — Z79899 Other long term (current) drug therapy: Secondary | ICD-10-CM

## 2016-10-05 DIAGNOSIS — I4891 Unspecified atrial fibrillation: Secondary | ICD-10-CM | POA: Diagnosis present

## 2016-10-05 DIAGNOSIS — Z888 Allergy status to other drugs, medicaments and biological substances status: Secondary | ICD-10-CM

## 2016-10-05 DIAGNOSIS — I5043 Acute on chronic combined systolic (congestive) and diastolic (congestive) heart failure: Secondary | ICD-10-CM | POA: Diagnosis present

## 2016-10-05 DIAGNOSIS — Z9889 Other specified postprocedural states: Secondary | ICD-10-CM

## 2016-10-05 DIAGNOSIS — R0602 Shortness of breath: Secondary | ICD-10-CM

## 2016-10-05 DIAGNOSIS — R1013 Epigastric pain: Secondary | ICD-10-CM

## 2016-10-05 DIAGNOSIS — Z7982 Long term (current) use of aspirin: Secondary | ICD-10-CM

## 2016-10-05 DIAGNOSIS — E785 Hyperlipidemia, unspecified: Secondary | ICD-10-CM | POA: Diagnosis present

## 2016-10-05 DIAGNOSIS — E039 Hypothyroidism, unspecified: Secondary | ICD-10-CM | POA: Diagnosis present

## 2016-10-05 DIAGNOSIS — R0989 Other specified symptoms and signs involving the circulatory and respiratory systems: Secondary | ICD-10-CM | POA: Diagnosis present

## 2016-10-05 DIAGNOSIS — I272 Pulmonary hypertension, unspecified: Secondary | ICD-10-CM | POA: Diagnosis present

## 2016-10-05 DIAGNOSIS — K8020 Calculus of gallbladder without cholecystitis without obstruction: Secondary | ICD-10-CM | POA: Diagnosis present

## 2016-10-05 DIAGNOSIS — N179 Acute kidney failure, unspecified: Secondary | ICD-10-CM | POA: Diagnosis present

## 2016-10-05 LAB — BRAIN NATRIURETIC PEPTIDE: B NATRIURETIC PEPTIDE 5: 1012.7 pg/mL — AB (ref 0.0–100.0)

## 2016-10-05 LAB — DIFFERENTIAL
BASOS PCT: 0 %
Band Neutrophils: 0 %
Basophils Absolute: 0 10*3/uL (ref 0.0–0.1)
Blasts: 0 %
EOS ABS: 0 10*3/uL (ref 0.0–0.7)
Eosinophils Relative: 0 %
LYMPHS PCT: 98 %
Lymphs Abs: 125.3 10*3/uL — ABNORMAL HIGH (ref 0.7–4.0)
MYELOCYTES: 0 %
Metamyelocytes Relative: 0 %
Monocytes Absolute: 1.3 10*3/uL — ABNORMAL HIGH (ref 0.1–1.0)
Monocytes Relative: 1 %
NEUTROS PCT: 1 %
NRBC: 0 /100{WBCs}
Neutro Abs: 1.3 10*3/uL — ABNORMAL LOW (ref 1.7–7.7)
OTHER: 0 %
PROMYELOCYTES ABS: 0 %

## 2016-10-05 LAB — CBC
HEMATOCRIT: 29.8 % — AB (ref 36.0–46.0)
HEMOGLOBIN: 8.8 g/dL — AB (ref 12.0–15.0)
MCH: 27 pg (ref 26.0–34.0)
MCHC: 29.5 g/dL — AB (ref 30.0–36.0)
MCV: 91.4 fL (ref 78.0–100.0)
Platelets: 170 10*3/uL (ref 150–400)
RBC: 3.26 MIL/uL — AB (ref 3.87–5.11)
RDW: 17.5 % — ABNORMAL HIGH (ref 11.5–15.5)
WBC: 127.9 10*3/uL (ref 4.0–10.5)

## 2016-10-05 LAB — I-STAT TROPONIN, ED
TROPONIN I, POC: 0.04 ng/mL (ref 0.00–0.08)
TROPONIN I, POC: 0.08 ng/mL (ref 0.00–0.08)

## 2016-10-05 LAB — COMPREHENSIVE METABOLIC PANEL
ALBUMIN: 3.7 g/dL (ref 3.5–5.0)
ALT: 17 U/L (ref 14–54)
AST: 27 U/L (ref 15–41)
Alkaline Phosphatase: 102 U/L (ref 38–126)
Anion gap: 4 — ABNORMAL LOW (ref 5–15)
BILIRUBIN TOTAL: 0.7 mg/dL (ref 0.3–1.2)
BUN: 16 mg/dL (ref 6–20)
CHLORIDE: 108 mmol/L (ref 101–111)
CO2: 27 mmol/L (ref 22–32)
CREATININE: 1.31 mg/dL — AB (ref 0.44–1.00)
Calcium: 8.2 mg/dL — ABNORMAL LOW (ref 8.9–10.3)
GFR calc Af Amer: 52 mL/min — ABNORMAL LOW (ref >60)
GFR, EST NON AFRICAN AMERICAN: 45 mL/min — AB (ref >60)
GLUCOSE: 103 mg/dL — AB (ref 65–99)
POTASSIUM: 6.1 mmol/L — AB (ref 3.5–5.1)
Sodium: 139 mmol/L (ref 135–145)
Total Protein: 6.2 g/dL — ABNORMAL LOW (ref 6.5–8.1)

## 2016-10-05 LAB — POTASSIUM: POTASSIUM: 4.9 mmol/L (ref 3.5–5.1)

## 2016-10-05 LAB — I-STAT CG4 LACTIC ACID, ED: LACTIC ACID, VENOUS: 0.73 mmol/L (ref 0.5–1.9)

## 2016-10-05 LAB — URINALYSIS, ROUTINE W REFLEX MICROSCOPIC
BILIRUBIN URINE: NEGATIVE
Glucose, UA: NEGATIVE mg/dL
Ketones, ur: NEGATIVE mg/dL
Leukocytes, UA: NEGATIVE
Nitrite: NEGATIVE
PROTEIN: NEGATIVE mg/dL
SPECIFIC GRAVITY, URINE: 1.011 (ref 1.005–1.030)
pH: 5 (ref 5.0–8.0)

## 2016-10-05 LAB — INFLUENZA PANEL BY PCR (TYPE A & B)
INFLAPCR: NEGATIVE
INFLBPCR: NEGATIVE

## 2016-10-05 LAB — TSH: TSH: 3.882 u[IU]/mL (ref 0.350–4.500)

## 2016-10-05 MED ORDER — ACETAMINOPHEN 325 MG PO TABS
650.0000 mg | ORAL_TABLET | Freq: Once | ORAL | Status: AC
  Administered 2016-10-05: 650 mg via ORAL
  Filled 2016-10-05: qty 2

## 2016-10-05 MED ORDER — HEPARIN SODIUM (PORCINE) 5000 UNIT/ML IJ SOLN
5000.0000 [IU] | Freq: Three times a day (TID) | INTRAMUSCULAR | Status: DC
  Administered 2016-10-06 – 2016-10-11 (×16): 5000 [IU] via SUBCUTANEOUS
  Filled 2016-10-05 (×11): qty 1

## 2016-10-05 MED ORDER — CARVEDILOL 6.25 MG PO TABS
6.2500 mg | ORAL_TABLET | Freq: Two times a day (BID) | ORAL | Status: DC
  Administered 2016-10-06 – 2016-10-11 (×12): 6.25 mg via ORAL
  Filled 2016-10-05 (×13): qty 1

## 2016-10-05 MED ORDER — ASPIRIN 81 MG PO CHEW
324.0000 mg | CHEWABLE_TABLET | Freq: Once | ORAL | Status: AC
  Administered 2016-10-05: 324 mg via ORAL
  Filled 2016-10-05: qty 4

## 2016-10-05 MED ORDER — ACETAMINOPHEN 650 MG RE SUPP: 650.0000 mg | Freq: Four times a day (QID) | RECTAL | Status: DC | PRN

## 2016-10-05 MED ORDER — FUROSEMIDE 10 MG/ML IJ SOLN
20.0000 mg | Freq: Every day | INTRAMUSCULAR | Status: DC
  Administered 2016-10-06: 20 mg via INTRAVENOUS
  Filled 2016-10-05: qty 2

## 2016-10-05 MED ORDER — ACETAMINOPHEN 325 MG PO TABS
650.0000 mg | ORAL_TABLET | Freq: Four times a day (QID) | ORAL | Status: DC | PRN
  Administered 2016-10-08 – 2016-10-11 (×3): 650 mg via ORAL
  Filled 2016-10-05 (×3): qty 2

## 2016-10-05 MED ORDER — FUROSEMIDE 10 MG/ML IJ SOLN
20.0000 mg | Freq: Once | INTRAMUSCULAR | Status: AC
  Administered 2016-10-05: 20 mg via INTRAVENOUS
  Filled 2016-10-05: qty 2

## 2016-10-05 MED ORDER — SODIUM CHLORIDE 0.9 % IV BOLUS (SEPSIS)
500.0000 mL | Freq: Once | INTRAVENOUS | Status: AC
  Administered 2016-10-05: 500 mL via INTRAVENOUS

## 2016-10-05 MED ORDER — PANTOPRAZOLE SODIUM 20 MG PO TBEC
20.0000 mg | DELAYED_RELEASE_TABLET | Freq: Every day | ORAL | Status: DC
  Administered 2016-10-06 – 2016-10-11 (×7): 20 mg via ORAL
  Filled 2016-10-05 (×8): qty 1

## 2016-10-05 MED ORDER — IPRATROPIUM-ALBUTEROL 0.5-2.5 (3) MG/3ML IN SOLN
3.0000 mL | Freq: Once | RESPIRATORY_TRACT | Status: AC
  Administered 2016-10-05: 3 mL via RESPIRATORY_TRACT
  Filled 2016-10-05: qty 3

## 2016-10-05 MED ORDER — ASPIRIN EC 81 MG PO TBEC: 81.0000 mg | DELAYED_RELEASE_TABLET | Freq: Every day | ORAL | Status: DC

## 2016-10-05 MED ORDER — LISINOPRIL 20 MG PO TABS
20.0000 mg | ORAL_TABLET | Freq: Every day | ORAL | Status: DC
Start: 2016-10-06 — End: 2016-10-05

## 2016-10-05 MED ORDER — GI COCKTAIL ~~LOC~~
30.0000 mL | Freq: Once | ORAL | Status: AC
  Administered 2016-10-06: 30 mL via ORAL
  Filled 2016-10-05: qty 30

## 2016-10-05 MED ORDER — LEVOTHYROXINE SODIUM 50 MCG PO TABS
50.0000 ug | ORAL_TABLET | Freq: Every day | ORAL | Status: DC
  Administered 2016-10-06 – 2016-10-11 (×6): 50 ug via ORAL
  Filled 2016-10-05 (×6): qty 1

## 2016-10-05 NOTE — ED Notes (Signed)
Pt transported to XR.  

## 2016-10-05 NOTE — ED Notes (Signed)
Pt ambulatory to restroom without difficulty. Pt baseline pulse ox 99-100%, pulse ox between 95-97% while ambulating.

## 2016-10-05 NOTE — ED Triage Notes (Signed)
Pt was seen at [facility] on 12/6 for sob.  She was dx with virus.  Pt states unable to sleep d/t sob, epigastric pain.  Today she began vomiting.

## 2016-10-05 NOTE — ED Provider Notes (Signed)
[facility] DEPT Provider Note   CSN: [id number] Arrival date & time: 10/05/16  1558     History   Chief Complaint Chief Complaint  Patient presents with  . Shortness of Breath  . Abdominal Pain  . Emesis    HPI Katherine Terrell is a 55 y.o. female.  HPI  Patient is a 55 year old female with history of cardiac myopathy, EF 30% with history of CLL, hyperlipidemia, hypertension, hypothyroidism. She is presenting today with myalgias, fatigue, feelings of fever, shortness of breath, chest pain. Patient was seen yesterday with the same symptoms. Patient given ketorolac and fluids and sent home with a diagnosis of viral syndrome. Patient's back again today because she is continuing to feel bad. Patient says his shortness of breath and chest pain overnight. The chest pain she states has been going on since her cough started 15 days ago. Patient's had nonproductive cough.  The chest pain started las night and has been intermittent.   Patient not has not had any surgeries recently, no long car trips or plane trips.    Past Medical History:  Diagnosis Date  . Cardiomyopathy    echo: (11/11) EF 25-30% with diffuse hypokinesis, mild left atrial enlargement. 11/11 HIV and ANA negative. TSH normal. LHC (1/12): EF 50-55% no angiographic CAD. Echo (2/12): EF 55-60% moderate LV hypertrophy, grade I diastolic dysfunction, mild MR, normal RV  . Chronic lymphocytic leukemia ([place])    followed by Dr. Ralene Ok  . Hyperlipidemia   . Hypertension   . Leukemia ([place])   . Multinodular goiter    with hypothyroidism    Patient Active Problem List   Diagnosis Date Noted  . Hyperkalemia 10/05/2016  . Hypoxia   . Acute systolic CHF (congestive heart failure) ([place]) 11/30/2015  . Sepsis ([place]) 11/28/2015  . CAP (community acquired pneumonia) 11/27/2015  . Ventricular tachycardia, non-sustained ([place]) 11/27/2015  . Hyperlipidemia 11/27/2015  . Essential hypertension 11/27/2015  . Influenza B  11/27/2015  . Dehydration 11/27/2015  . Chronic diastolic heart failure ([place]) 11/27/2015  . CLL (chronic lymphocytic leukemia) ([place]) 12/04/2011  . Hypothyroidism 12/23/2010  . CHRONIC SYSTOLIC HEART FAILURE 123456    Past Surgical History:  Procedure Laterality Date  . CARDIAC CATHETERIZATION  2012   @ [place]  . CARDIAC CATHETERIZATION N/A 12/13/2015   Procedure: Left Heart Cath and Coronary Angiography;  Surgeon: Larey Dresser, MD;  Location: [facility];  Service: Cardiovascular;  Laterality: N/A;    OB History    Gravida Para Term Preterm AB Living   3 3 3     3    SAB TAB Ectopic Multiple Live Births                   Home Medications    Prior to Admission medications   Medication Sig Start Date End Date Taking? Authorizing Provider  acetaminophen (TYLENOL) 500 MG tablet Take 500 mg by mouth every 6 (six) hours as needed.   Yes Historical Provider, MD  aspirin EC 81 MG tablet Take 1 tablet (81 mg total) by mouth daily. 06/04/16  Yes Kelvin Cellar, MD  carvedilol (COREG) 6.25 MG tablet Take 1 tablet (6.25 mg total) by mouth 2 (two) times daily with a meal. 07/14/16  Yes Wellington Hampshire, MD  furosemide (LASIX) 20 MG tablet Take 1 tablet (20 mg total) by mouth daily. 07/14/16  Yes Wellington Hampshire, MD  ibuprofen (ADVIL,MOTRIN) 600 MG tablet Take 1 tablet (600 mg total) by mouth every 6 (  six) hours as needed. 10/04/16  Yes Olivia Canter Sam, PA-C  levothyroxine (SYNTHROID, LEVOTHROID) 50 MCG tablet Take 1 tablet (50 mcg total) by mouth daily before breakfast. 02/04/16  Yes Larey Dresser, MD  lisinopril (PRINIVIL,ZESTRIL) 20 MG tablet Take 20 mg by mouth daily.   Yes Historical Provider, MD  potassium chloride (K-DUR) 10 MEQ tablet Take 1 tablet (10 mEq total) by mouth daily. 07/14/16 10/12/16 Yes Wellington Hampshire, MD  lisinopril (PRINIVIL,ZESTRIL) 5 MG tablet Take 1 tablet (5 mg total) by mouth daily. Patient not taking: Reported on 10/05/2016 07/14/16   Wellington Hampshire,  MD  promethazine-dextromethorphan (PROMETHAZINE-DM) 6.25-15 MG/5ML syrup Take 5 mLs by mouth 4 (four) times daily as needed for cough. Patient not taking: Reported on 10/05/2016 10/04/16   Anne Ng, PA-C    Family History Family History  Problem Relation Age of Onset  . Heart attack Mother 14  . Diabetes Sister     Social History Social History  Substance Use Topics  . Smoking status: Never Smoker  . Smokeless tobacco: Never Used  . Alcohol use No     Allergies   Tramadol   Review of Systems Review of Systems  Constitutional: Positive for fatigue and fever.  Respiratory: Positive for shortness of breath.   Cardiovascular: Positive for chest pain. Negative for palpitations and leg swelling.  Gastrointestinal: Negative for abdominal pain.  Musculoskeletal: Negative for back pain.  Neurological: Positive for weakness.  Psychiatric/Behavioral: Negative for agitation.  All other systems reviewed and are negative.    Physical Exam Updated Vital Signs BP 125/67   Pulse 76   Temp 98.9 F (37.2 C)   Resp (!) 21   Ht 5\' 3"  (1.6 m)   Wt 181 lb 1.6 oz (82.1 kg)   SpO2 94%   BMI 32.08 kg/m   Physical Exam  Constitutional: She is oriented to person, place, and time. She appears well-developed and well-nourished.  HENT:  Head: Normocephalic and atraumatic.  Eyes: Right eye exhibits no discharge. Left eye exhibits no discharge.  Cardiovascular: Normal rate, regular rhythm and normal heart sounds.   No murmur heard. Pulmonary/Chest: Effort normal and breath sounds normal. She has no wheezes. She has no rales.  Abdominal: Soft. She exhibits no distension. There is no tenderness.  Musculoskeletal: She exhibits no edema.  Neurological: She is oriented to person, place, and time.  Skin: Skin is warm and dry. She is not diaphoretic.  Psychiatric: She has a normal mood and affect.  Nursing note and vitals reviewed.    ED Treatments / Results  Labs (all labs ordered  are listed, but only abnormal results are displayed) Labs Reviewed  CBC - Abnormal; Notable for the following:       Result Value   WBC 127.9 (*)    RBC 3.26 (*)    Hemoglobin 8.8 (*)    HCT 29.8 (*)    MCHC 29.5 (*)    RDW 17.5 (*)    All other components within normal limits  COMPREHENSIVE METABOLIC PANEL - Abnormal; Notable for the following:    Potassium 6.1 (*)    Glucose, Bld 103 (*)    Creatinine, Ser 1.31 (*)    Calcium 8.2 (*)    Total Protein 6.2 (*)    GFR calc non Af Amer 45 (*)    GFR calc Af Amer 52 (*)    Anion gap 4 (*)    All other components within normal limits  URINALYSIS, ROUTINE W  REFLEX MICROSCOPIC - Abnormal; Notable for the following:    Color, Urine STRAW (*)    Hgb urine dipstick MODERATE (*)    Bacteria, UA RARE (*)    Squamous Epithelial / LPF 0-5 (*)    All other components within normal limits  BRAIN NATRIURETIC PEPTIDE - Abnormal; Notable for the following:    B Natriuretic Peptide 1,012.7 (*)    All other components within normal limits  DIFFERENTIAL - Abnormal; Notable for the following:    Neutro Abs 1.3 (*)    Lymphs Abs 125.3 (*)    Monocytes Absolute 1.3 (*)    All other components within normal limits  TROPONIN I - Abnormal; Notable for the following:    Troponin I 0.04 (*)    All other components within normal limits  URINE CULTURE  INFLUENZA PANEL BY PCR (TYPE A & B, H1N1)  POTASSIUM  TSH  BASIC METABOLIC PANEL  CBC  TROPONIN I  TROPONIN I  LIPASE, BLOOD  I-STAT TROPOININ, ED  I-STAT CG4 LACTIC ACID, ED  I-STAT TROPOININ, ED    EKG  EKG Interpretation  Date/Time:  Thursday October 05 2016 16:13:14 EST Ventricular Rate:  72 PR Interval:  238 QRS Duration: 98 QT Interval:  404 QTC Calculation: 442 R Axis:   51 Text Interpretation:  Sinus rhythm with 1st degree A-V block with occasional Premature ventricular complexes Possible Left atrial enlargement Nonspecific T wave abnormality Abnormal ECG No significant change  since last tracing Confirmed by Gerald Leitz ([id number]) on 10/05/2016 5:16:55 PM       Radiology Dg Chest 2 View  Result Date: 10/05/2016 CLINICAL DATA:  Productive cough for 2 weeks EXAM: CHEST  2 VIEW COMPARISON:  10/04/2016 FINDINGS: There is bilateral mild interstitial thickening. There is no focal parenchymal opacity. There is no pleural effusion or pneumothorax. There is stable cardiomegaly. The osseous structures are unremarkable. IMPRESSION: Cardiomegaly with mild pulmonary vascular congestion. Electronically Signed   By: Kathreen Devoid   On: 10/05/2016 17:45   Dg Chest 2 View  Result Date: 10/04/2016 CLINICAL DATA:  55 year old female with cough for 2 weeks. Increased congestion and shortness of breath recently. Initial encounter. EXAM: CHEST  2 VIEW COMPARISON:  06-27-16 and earlier. FINDINGS: Chronic cardiomegaly is stable. Other mediastinal contours are within normal limits. Visualized tracheal air column is within normal limits. No pneumothorax or pleural effusion. Chronic increased interstitial markings, but less pronounced than in 2023/07/24. Still, the interstitial opacity appears to be slightly above baseline. No consolidation or confluent pulmonary opacity. No acute osseous abnormality identified. IMPRESSION: 1. Chronic cardiomegaly. Mild pulmonary interstitial edema suspected today, but not as pronounced on 2016/06/27. No pleural effusion. 2. Acute viral/ atypical respiratory infection is difficult to exclude. No focal pneumonia. Electronically Signed   By: Genevie Ann M.D.   On: 10/04/2016 10:24    Procedures Procedures (including critical care time)  Medications Ordered in ED Medications  levothyroxine (SYNTHROID, LEVOTHROID) tablet 50 mcg (not administered)  aspirin EC tablet 81 mg (not administered)  carvedilol (COREG) tablet 6.25 mg (not administered)  heparin injection 5,000 Units (not administered)  acetaminophen (TYLENOL) tablet 650 mg (not administered)    Or    acetaminophen (TYLENOL) suppository 650 mg (not administered)  gi cocktail (Maalox,Lidocaine,Donnatal) (not administered)  pantoprazole (PROTONIX) EC tablet 20 mg (not administered)  furosemide (LASIX) injection 20 mg (not administered)  acetaminophen (TYLENOL) tablet 650 mg (650 mg Oral Given 10/05/16 1903)  sodium chloride 0.9 % bolus 500 mL (0 mLs Intravenous  Stopped 10/05/16 1946)  aspirin chewable tablet 324 mg (324 mg Oral Given 10/05/16 1937)  furosemide (LASIX) injection 20 mg (20 mg Intravenous Given 10/05/16 1938)  ipratropium-albuterol (DUONEB) 0.5-2.5 (3) MG/3ML nebulizer solution 3 mL (3 mLs Nebulization Given 10/05/16 2258)     Initial Impression / Assessment and Plan / ED Course  I have reviewed the triage vital signs and the nursing notes.  Pertinent labs & imaging results that were available during my care of the patient were reviewed by me and considered in my medical decision making (see chart for details).  Clinical Course     Patient is a 55 year old female with EF of 20%, hypothyroidism, CHF, CLL presenting today with worsening charts of breath. Patient was seen yesterday for similar complaints found to have a normal chest x-ray and normal labs. Sent with home with a viral syndrome. Patient returned today with increasing shortness of breath when she lies down at home. Patient denies a change of medication. Patient has no lower leg leg extremity swelling. Patient reports orthopnea with difficulty breathing when she lays back. She reports that she's had chest pain as well. Does not radiate. Patient feels too weak to walk around.  I suspect that this is likely flu versus CHF exacerbation. Patient does not have any crackles on exam in the 100% on room air. We repeat lasb pm the patient to further evaluate. We will repeat a chest x-ray and labs today since patient feels that she's getting much worse and has developed chest pain.  Patient has elevated K, on K at home because of  lasix. No peaked Ts, would like to give fluid to decrase K but she is fluid overloaded on CXR and has EF of 25% so we have to be judcicial. For these reasons we need to IV lasix, watch K, hope SOB continues to resolve.   Flu pending.   Discussed with admitting resident.   Final Clinical Impressions(s) / ED Diagnoses   Final diagnoses:  Shortness of breath    New Prescriptions Current Discharge Medication List       Odarius Dines Julio Alm, MD 10/05/16 2259

## 2016-10-05 NOTE — ED Notes (Signed)
Family at bedside. 

## 2016-10-05 NOTE — ED Notes (Signed)
Placed patient into a gown and on the monitor provider is in the there

## 2016-10-05 NOTE — H&P (Signed)
[facility] Admission History and Physical Service Pager: [phone]  Patient name: Katherine Terrell Medical record number: [id number] Date of birth: [birth year] Age: 55 y.o. Gender: female  Primary Care Provider: [facility] Consultants: None Code Status: Full  Chief Complaint: CHF Exacerbation, Hyperkalemia  Assessment and Plan: [name] is a 55 y.o. female presenting with CHF Exacerbation and Hyperkalemia. PMH is significant for HFrEF, CLL, Hypothyroidism, Hyperlididemia, Hypertension, Atrial Fibrillation.   # HFrEF: Acute Exacerbation. BNP 1012.7 (similar to prior). Troponin 0.04>0.08. CXR with mild vascular congestion. EKG with first degree AV block, PVC, normal sinus rhythm. Vitals stable. Echocardiogram in 10/2015 with EF 30-35%, moderately to severely reduced systolic function, diffuse hypokinesis, moderate MR, severely dilated LA, moderately dilated RA, moderate TR; diastolic dysfunction not sufficiently evaluated due to atrial fibrillation. Catheterization in 12/2015 showed no significant CAD with EF 45%. Currently prescribed Aspirin 81mg , Coreg 6.25mg  twice daily, lasix 20mg  daily, lisinopril 20mg  daily. Follows with Dr. Fletcher Anon.  - Place in observation, Dr. Ardelia Mems attending - Vitals per floor protocol - Daily weights. Strict Intake and output. - Trend troponin - Consider repeating Echocardiogram if no improvement noted  - Lasix 20mg  IV daily. Transition to oral lasix when able. - Continue home Aspirin 81mg  - Holding home Lisinopril  # Hyperkalemia: Potassium 6.1 at admission. EKG with PVC, first degree AV block, and normal sinus rhythm. Currently on potassium supplementation with Kdur 66mEq daily due to lasix use.  - Repeat Potassium level tonight.  - AM BMP - Holding home Lisinopril and KDUR  # Viral Illness: Reports cough, congestion, nausea/vomiting. Lactic Acid 0.73. No signs of infiltrate on CXR. Flu negative.  - Tylenol  as needed - Holding Lisinopril. Doubt this is cause of cough.  - Will try a single Duoneb. If helpful, consider adding as needed.  - Consider PFTs if no improvement  # Epigastric Pain: Pain in epigastric area with occasional radiation up and nausea/vomiting. Tender to palpation.  - Check Lipase - GI Cocktail - Protonix  # Acute Kidney Injury: Creatinine 1.31 at admission, up from baseline of 0.8-0.9. Suspect secondary to fluid overload. Anticipate worsening with lasix.  - AM BMP  # Hypothyroidism: TSH in 10/2015 normal at 2.087. Currently prescribed Synthroid 81mcg daily.  - TSH - Continue home Synthroid  # CLL: Diagnosed in 2011. Asymptomatic. WBC 127.9 at admission. Hematuria noted, consistent with prior UA. Followed by Dr. Ralene Ok. - Monitor on CBC  # Hyperlipidemia: Lipid Panel in 10/2015 with cholesterol 157, HDL 30, LDL 104, Triglycerides 116. Not currently on a statin. - Currently with muscle aches. Consider initiating statin once improved.   # Hypertension: Stable. Currently prescribed Coreg 6.25mg  twice daily, Lisinopril 20mg  daily. - Holding home Lisinopril - Continue home Coreg - Monitor BP  FEN/GI: Heart Healthy Prophylaxis: Heparin SQ  Disposition: Home  History of Present Illness:  Katherine Terrell is a 55 y.o. female presenting with HF exacerbation and hyperkalemia.   Presented to the ED on 05/04/16 with two week history of productive cough, congestion, and body aches. Also reported shortness of breath, occasional chest pain, nausea/vomiting, and epigastric pain. Workup negative and vitals were stable. Diagnosed with viral URI and discharged home.   Returned to the ED today due to shortness of breath. Continues to note cough, occasional chest pain with cough, myalgias, and fatigue. Notes worsening orthopnea. Vitals again normal. Noted to have hyperkalemia to 6.1 in ED and given 500cc bolus of fluids. Also received fluids at her recent ED visit  on 7/6. ED physician  concerned about fluid overload status in addition to fluids given for hyperkalemia and recommends placement in observation overnight.   Review Of Systems: Per HPI   ROS  Patient Active Problem List   Diagnosis Date Noted  . Hypoxia   . Acute systolic CHF (congestive heart failure) ([place]) 11/30/2015  . Sepsis ([place]) 11/28/2015  . CAP (community acquired pneumonia) 11/27/2015  . Ventricular tachycardia, non-sustained ([place]) 11/27/2015  . Hyperlipidemia 11/27/2015  . Essential hypertension 11/27/2015  . Influenza B 11/27/2015  . Dehydration 11/27/2015  . Chronic diastolic heart failure ([place]) 11/27/2015  . CLL (chronic lymphocytic leukemia) ([place]) 12/04/2011  . Hypothyroidism 12/23/2010  . CHRONIC SYSTOLIC HEART FAILURE 123456    Past Medical History: Past Medical History:  Diagnosis Date  . Cardiomyopathy    echo: (11/11) EF 25-30% with diffuse hypokinesis, mild left atrial enlargement. 11/11 HIV and ANA negative. TSH normal. LHC (1/12): EF 50-55% no angiographic CAD. Echo (2/12): EF 55-60% moderate LV hypertrophy, grade I diastolic dysfunction, mild MR, normal RV  . Chronic lymphocytic leukemia ([place])    followed by Dr. Ralene Ok  . Hyperlipidemia   . Hypertension   . Leukemia ([place])   . Multinodular goiter    with hypothyroidism    Past Surgical History: Past Surgical History:  Procedure Laterality Date  . CARDIAC CATHETERIZATION  2012   @ [place]  . CARDIAC CATHETERIZATION N/A 12/13/2015   Procedure: Left Heart Cath and Coronary Angiography;  Surgeon: Larey Dresser, MD;  Location: [facility];  Service: Cardiovascular;  Laterality: N/A;    Social History: Social History  Substance Use Topics  . Smoking status: Never Smoker  . Smokeless tobacco: Never Used  . Alcohol use No   Please also refer to relevant sections of EMR.  Family History: Family History  Problem Relation Age of Onset  . Heart attack Mother 85  . Diabetes Sister    Allergies and  Medications: Allergies  Allergen Reactions  . Tramadol Nausea And Vomiting   No current facility-administered medications on file prior to encounter.    Current Outpatient Prescriptions on File Prior to Encounter  Medication Sig Dispense Refill  . acetaminophen (TYLENOL) 500 MG tablet Take 500 mg by mouth every 6 (six) hours as needed.    Marland Kitchen aspirin EC 81 MG tablet Take 1 tablet (81 mg total) by mouth daily. 30 tablet 0  . carvedilol (COREG) 6.25 MG tablet Take 1 tablet (6.25 mg total) by mouth 2 (two) times daily with a meal. 60 tablet 11  . furosemide (LASIX) 20 MG tablet Take 1 tablet (20 mg total) by mouth daily. 30 tablet 11  . ibuprofen (ADVIL,MOTRIN) 600 MG tablet Take 1 tablet (600 mg total) by mouth every 6 (six) hours as needed. 30 tablet 0  . levothyroxine (SYNTHROID, LEVOTHROID) 50 MCG tablet Take 1 tablet (50 mcg total) by mouth daily before breakfast. 30 tablet 0  . potassium chloride (K-DUR) 10 MEQ tablet Take 1 tablet (10 mEq total) by mouth daily. 30 tablet 11  . lisinopril (PRINIVIL,ZESTRIL) 5 MG tablet Take 1 tablet (5 mg total) by mouth daily. (Patient not taking: Reported on 10/05/2016) 30 tablet 11  . promethazine-dextromethorphan (PROMETHAZINE-DM) 6.25-15 MG/5ML syrup Take 5 mLs by mouth 4 (four) times daily as needed for cough. (Patient not taking: Reported on 10/05/2016) 118 mL 0    Objective: BP 132/68   Pulse 75   Temp 98 F (36.7 C) (Oral)   Resp 23  Ht 5\' 3"  (1.6 m)   Wt 181 lb (82.1 kg)   SpO2 100%   BMI 32.06 kg/m  Exam: General: 55yo female resting comfortably in no apparent distress Eyes: PERRLA, white sclera, EOM intact ENTM: Oropharynx clear, Tympanic membranes normal bilaterally Neck: Supple, no lymphadenopathy Cardiovascular: S1 and S2 noted, no murmurs, regular rate and rhythm Respiratory: Bilateral crackles noted. No increased work of breathing Gastrointestinal: Bowel sounds normal. Nondistended. Epigastric tenderness. Negative Murphys.  Negative Rebound MSK: Mild 1+ edema noted bilaterally, muscle strength 5/5 in upper and lower extremities Derm: No rash noted, warm Neuro: No focal deficits, sensation intact Psych: AAO x3, normal affect  Labs and Imaging: CBC BMET   Recent Labs Lab 10/05/16 1614  WBC 127.9*  HGB 8.8*  HCT 29.8*  PLT 170    Recent Labs Lab 10/05/16 1614  NA 139  K 6.1*  CL 108  CO2 27  BUN 16  CREATININE 1.31*  GLUCOSE 103*  CALCIUM 8.2*    - BNP 1012.7 - Troponin 0.04> 0.08 - Lactic Acid 0.73 - Flu negative  Dg Chest 2 View  Result Date: 10/05/2016 CLINICAL DATA:  Productive cough for 2 weeks EXAM: CHEST  2 VIEW COMPARISON:  10/04/2016 FINDINGS: There is bilateral mild interstitial thickening. There is no focal parenchymal opacity. There is no pleural effusion or pneumothorax. There is stable cardiomegaly. The osseous structures are unremarkable. IMPRESSION: Cardiomegaly with mild pulmonary vascular congestion. Electronically Signed   By: Kathreen Devoid   On: 10/05/2016 17:45     Lorna Few, DO 10/05/2016, 7:11 PM PGY-3, [place] [facility] pager: [phone], text pages welcome

## 2016-10-05 NOTE — ED Notes (Signed)
Attempted report x 1. Secretary given number for floor RN to return call.

## 2016-10-06 DIAGNOSIS — I5043 Acute on chronic combined systolic (congestive) and diastolic (congestive) heart failure: Secondary | ICD-10-CM

## 2016-10-06 DIAGNOSIS — I272 Pulmonary hypertension, unspecified: Secondary | ICD-10-CM

## 2016-10-06 DIAGNOSIS — I10 Essential (primary) hypertension: Secondary | ICD-10-CM

## 2016-10-06 DIAGNOSIS — E875 Hyperkalemia: Secondary | ICD-10-CM

## 2016-10-06 DIAGNOSIS — I420 Dilated cardiomyopathy: Secondary | ICD-10-CM

## 2016-10-06 LAB — BASIC METABOLIC PANEL
ANION GAP: 9 (ref 5–15)
Anion gap: 9 (ref 5–15)
BUN: 20 mg/dL (ref 6–20)
BUN: 18 mg/dL (ref 6–20)
CALCIUM: 8.4 mg/dL — AB (ref 8.9–10.3)
CHLORIDE: 104 mmol/L (ref 101–111)
CO2: 26 mmol/L (ref 22–32)
CO2: 27 mmol/L (ref 22–32)
CREATININE: 1.03 mg/dL — AB (ref 0.44–1.00)
CREATININE: 1.1 mg/dL — AB (ref 0.44–1.00)
Calcium: 8.4 mg/dL — ABNORMAL LOW (ref 8.9–10.3)
Chloride: 106 mmol/L (ref 101–111)
GFR calc Af Amer: >60 mL/min (ref >60)
GFR calc non Af Amer: 56 mL/min — ABNORMAL LOW (ref >60)
GLUCOSE: 112 mg/dL — AB (ref 65–99)
GLUCOSE: 114 mg/dL — AB (ref 65–99)
POTASSIUM: 3.9 mmol/L (ref 3.5–5.1)
Potassium: 5.7 mmol/L — ABNORMAL HIGH (ref 3.5–5.1)
Sodium: 141 mmol/L (ref 135–145)
Sodium: 140 mmol/L (ref 135–145)

## 2016-10-06 LAB — URINE CULTURE

## 2016-10-06 LAB — TROPONIN I
TROPONIN I: 0.26 ng/mL — AB (ref <0.03)
TROPONIN I: 0.14 ng/mL — AB (ref <0.03)
Troponin I: 0.04 ng/mL (ref <0.03)

## 2016-10-06 LAB — CBC
HCT: 29.1 % — ABNORMAL LOW (ref 36.0–46.0)
HEMOGLOBIN: 8.6 g/dL — AB (ref 12.0–15.0)
MCH: 27 pg (ref 26.0–34.0)
MCHC: 29.6 g/dL — AB (ref 30.0–36.0)
MCV: 91.5 fL (ref 78.0–100.0)
PLATELETS: 169 10*3/uL (ref 150–400)
RBC: 3.18 MIL/uL — AB (ref 3.87–5.11)
RDW: 17 % — ABNORMAL HIGH (ref 11.5–15.5)
WBC: 128.6 10*3/uL (ref 4.0–10.5)

## 2016-10-06 LAB — POTASSIUM: Potassium: 5.8 mmol/L — ABNORMAL HIGH (ref 3.5–5.1)

## 2016-10-06 LAB — LIPASE, BLOOD: Lipase: 21 U/L (ref 11–51)

## 2016-10-06 MED ORDER — FUROSEMIDE 10 MG/ML IJ SOLN
40.0000 mg | Freq: Two times a day (BID) | INTRAMUSCULAR | Status: DC
  Administered 2016-10-06 – 2016-10-09 (×6): 40 mg via INTRAVENOUS
  Filled 2016-10-06 (×6): qty 4

## 2016-10-06 MED ORDER — SPIRONOLACTONE 25 MG PO TABS
25.0000 mg | ORAL_TABLET | Freq: Every day | ORAL | Status: DC
  Administered 2016-10-06 – 2016-10-10 (×5): 25 mg via ORAL
  Filled 2016-10-06 (×5): qty 1

## 2016-10-06 MED ORDER — ASPIRIN EC 325 MG PO TBEC
325.0000 mg | DELAYED_RELEASE_TABLET | Freq: Every day | ORAL | Status: DC
  Administered 2016-10-06 – 2016-10-11 (×6): 325 mg via ORAL
  Filled 2016-10-06 (×6): qty 1

## 2016-10-06 MED ORDER — ONDANSETRON HCL 4 MG/2ML IJ SOLN
4.0000 mg | Freq: Four times a day (QID) | INTRAMUSCULAR | Status: DC | PRN
  Administered 2016-10-06 – 2016-10-10 (×3): 4 mg via INTRAVENOUS
  Filled 2016-10-06 (×2): qty 2

## 2016-10-06 MED ORDER — ONDANSETRON HCL 4 MG/2ML IJ SOLN
INTRAMUSCULAR | Status: AC
  Filled 2016-10-06: qty 2

## 2016-10-06 MED ORDER — GI COCKTAIL ~~LOC~~: 30.0000 mL | Freq: Three times a day (TID) | ORAL | Status: DC | PRN

## 2016-10-06 MED ORDER — IPRATROPIUM-ALBUTEROL 0.5-2.5 (3) MG/3ML IN SOLN
3.0000 mL | Freq: Once | RESPIRATORY_TRACT | Status: AC
  Administered 2016-10-06: 3 mL via RESPIRATORY_TRACT
  Filled 2016-10-06: qty 3

## 2016-10-06 MED ORDER — LISINOPRIL 5 MG PO TABS
2.5000 mg | ORAL_TABLET | Freq: Every day | ORAL | Status: DC
  Administered 2016-10-07 – 2016-10-08 (×2): 2.5 mg via ORAL
  Filled 2016-10-06 (×3): qty 1

## 2016-10-06 MED ORDER — SODIUM POLYSTYRENE SULFONATE 15 GM/60ML PO SUSP
30.0000 g | Freq: Once | ORAL | Status: AC
  Administered 2016-10-06: 30 g via ORAL
  Filled 2016-10-06: qty 120

## 2016-10-06 NOTE — Progress Notes (Signed)
Discussed increase in Troponin with Cardiology Fellow.  Normal catheterization in 12/2015. No active chest pain--resolved with GI cocktail. Will increase Aspirin to 325mg . Will not initiate Heparin at this time. Will consult Cardiology formally this morning for full evaluation.   Dr. Gerlean Ren 10/06/16, 5:56 AM

## 2016-10-06 NOTE — Progress Notes (Signed)
Family Medicine Teaching Service Daily Progress Note Intern Pager: [phone]  Patient name: Katherine Terrell Medical record number: [id number] Date of birth: 06/23/1961 Age: 55 y.o. Gender: female  Primary Care Provider: [facility] Consultants: cardiology Code Status: FULL  Pt Overview and Major Events to Date:  12/7 admitted for chest pain, hyperkalemia  Assessment and Plan: Katherine Terrell is a 55 y.o. female presenting with CHF Exacerbation and Hyperkalemia. PMH is significant for HFrEF, CLL, Hypothyroidism, Hyperlididemia, Hypertension, Atrial Fibrillation.   # HFrEF: Acute Exacerbation.  - cards consulted, appreciate recs: consider RHC to guide diuresis, continue current lasix plan - Daily weights. Strict Intake and output. - Trend troponin - Consider repeating Echocardiogram if no improvement noted  - Lasix 20mg  IV daily - Continue home Aspirin 81mg  - Holding home Lisinopril due to hyperkalemia  # Hyperkalemia, resolved: Gave Kayexolate, K down to 3.9.  - am BMP  # Viral Illness: Reports cough, congestion, nausea/vomiting. Lactic Acid 0.73. No signs of infiltrate on CXR. Flu negative.  - Tylenol as needed - Holding Lisinopril - duoneb once - zofran PRN for vomiting - Consider PFTs if no improvement  # Epigastric Pain: Pain in epigastric area with occasional radiation up and nausea/vomiting. Tender to palpation.  x 4 days. Lipase normal.  - Check Lipase - GI Cocktail - Protonix - RUQ [place]  - serial abdominal exams - needs outpatient colonoscopy, has never had  # Acute Kidney Injury: Creatinine 1.31 at admission, up from baseline of 0.8-0.9. Suspect secondary to fluid overload. Cr 1.03 today.  - AM BMP  # Hypothyroidism: TSH in 10/2015 normal at 2.087. Currently prescribed Synthroid 56mcg daily.  - TSH - Continue home Synthroid  # CLL: Diagnosed in 2011. Asymptomatic. WBC 127.9 at admission. Hematuria noted, consistent with prior UA. Followed  by Dr. Ralene Ok. - Monitor on CBC  # Hyperlipidemia: Lipid Panel in 10/2015 with cholesterol 157, HDL 30, LDL 104, Triglycerides 116. Not currently on a statin. - Currently with muscle aches. Consider initiating statin once improved.   # Hypertension: Stable. Currently prescribed Coreg 6.25mg  twice daily, Lisinopril 20mg  daily. - Holding home Lisinopril - Continue home Coreg - Monitor BP  FEN/GI: Heart Healthy Prophylaxis: Heparin SQ  Disposition: continued monitoring  Subjective:  Patient feels overall sick due to likely viral illness.   Objective: Temp:  [98 F (36.7 C)-98.9 F (37.2 C)] 98 F (36.7 C) (12/08 1033) Pulse Rate:  [71-83] 78 (12/08 1033) Resp:  [16-23] 18 (12/08 1033) BP: (100-135)/(67-91) 122/68 (12/08 1033) SpO2:  [93 %-100 %] 97 % (12/08 1142) Weight:  [181 lb (82.1 kg)-181 lb 1.6 oz (82.1 kg)] 181 lb 1.6 oz (82.1 kg) (12/07 2031) Physical Exam: General: Female sitting on edge of bed, eating breakfast Cardiovascular: RRR, no murmurs Respiratory: crackles in bilateral bases, no increased WOB Abdomen: soft, TTP in RUQ, + BS Extremities: no edema  Laboratory:  Recent Labs Lab 10/04/16 1053 10/05/16 1614 10/06/16 0306  WBC 119.0* 127.9* 128.6*  HGB 8.9* 8.8* 8.6*  HCT 29.5* 29.8* 29.1*  PLT 177 170 169    Recent Labs Lab 10/04/16 1053 10/05/16 1614  10/06/16 0306 10/06/16 0830 10/06/16 1422  NA 140 139  --  141  --  140  K 4.5 6.1*  < > 5.7* 5.8* 3.9  CL 107 108  --  106  --  104  CO2 26 27  --  26  --  27  BUN 18 16  --  20  --  18  CREATININE 1.08* 1.31*  --  1.03*  --  1.10*  CALCIUM 8.7* 8.2*  --  8.4*  --  8.4*  PROT 6.7 6.2*  --   --   --   --   BILITOT 0.8 0.7  --   --   --   --   ALKPHOS 100 102  --   --   --   --   ALT 17 17  --   --   --   --   AST 21 27  --   --   --   --   GLUCOSE 114* 103*  --  112*  --  114*  < > = values in this interval not displayed.  Lipase WNL.   Imaging/Diagnostic Tests: Dg Chest 2  View  Result Date: 10/05/2016 CLINICAL DATA:  Productive cough for 2 weeks EXAM: CHEST  2 VIEW COMPARISON:  10/04/2016 FINDINGS: There is bilateral mild interstitial thickening. There is no focal parenchymal opacity. There is no pleural effusion or pneumothorax. There is stable cardiomegaly. The osseous structures are unremarkable. IMPRESSION: Cardiomegaly with mild pulmonary vascular congestion. Electronically Signed   By: Kathreen Devoid   On: 10/05/2016 17:45    Sela Hilding, MD 10/06/2016, 3:51 PM PGY-1, [place] Intern pager: [phone], text pages welcome

## 2016-10-06 NOTE — Consult Note (Addendum)
Patient ID: Katherine Terrell MRN: [id number], DOB/AGE: 54-Nov-1962   Admit date: 10/05/2016   Reason for Consult: CHF Requesting MD: Dr. Ardelia Terrell, Internal Medicine    Primary Physician: Katherine Terrell Primary Cardiologist: Katherine Terrell   Pt. Profile:  55 y/o female with h/o chronic systolic HF (EF 99991111) due to nonischemic cardiomyopathy with no significant coronary disease on cath in 12/2015, mild AS, moderate MR, moderate to severe pulmonary hypertension.  CLL, hypothyroidism,  HTN and HLD, who presented 10/05/16 with fatigue, orthopnea, and shortness of breath. Also endorsing epigastric pain and vomiting, found to be in acute on chronic systolic HF.   Problem List  Past Medical History:  Diagnosis Date  . Cardiomyopathy    echo: (11/11) EF 25-30% with diffuse hypokinesis, mild left atrial enlargement. 11/11 HIV and ANA negative. TSH normal. LHC (1/12): EF 50-55% no angiographic CAD. Echo (2/12): EF 55-60% moderate LV hypertrophy, grade I diastolic dysfunction, mild MR, normal RV  . Chronic lymphocytic leukemia (Katherine Terrell)    followed by Dr. Ralene Terrell  . Hyperlipidemia   . Hypertension   . Leukemia ([place])   . Multinodular goiter    with hypothyroidism    Past Surgical History:  Procedure Laterality Date  . CARDIAC CATHETERIZATION  2012   @ Katherine Terrell  . CARDIAC CATHETERIZATION N/A 12/13/2015   Procedure: Left Heart Cath and Coronary Angiography;  Surgeon: Katherine Dresser, MD;  Location: [place] [facility];  Service: Cardiovascular;  Laterality: N/A;     Allergies  Allergies  Allergen Reactions  . Tramadol Nausea And Vomiting    HPI  No interpreter present during initial assessment. Much of history below was obtained by chart review.   The patient is a 55 y/o female, followed by Dr Katherine Terrell, with a h/o chronic systolic HF due to nonischemic cardiomyopathy with no significant coronary disease on cath in 12/2015. Echo 10/2015 showed EF of 30-35%, mild AS and moderate  MR and moderate to severe pulmonary hypertension.  Also with a h/o CLL, hypothyroidism,  HTN and HLD. Her medical management for HF includes Coreg, lisinopril and furosemide + supplemental  K.   She presented to the [facility] ED on 10/05/16 with complaints of fatigue, orthopnea, and shortness of breath. Also endorsing epigastric pain and vomiting. Lipase normal. CXR & BNP consistent with CHF. BNP 1,012.7. CXR shows cardiomegaly with mild pulmonary vascular congestion. EKG shows SR with PVCs. SCr initially 1.3 with K of 5.8>>5.7. Improved after Kayexalate. Repeat BMP shows K of 3.9. SCr 1.10. Troponin abnormal at 0.04>>0.26>>0.14.   She is on IV lasix. She is in NAD. She is on supplemental O2. Appears comfortable at rest and does not appear grossly volume overloaded. Her Terrell weight is 181 lb today. She was 180 lb during last OV with Katherine Terrell 06/2016.     Home Medications  Prior to Admission medications   Medication Sig Start Date End Date Taking? Authorizing Provider  acetaminophen (TYLENOL) 500 MG tablet Take 500 mg by mouth every 6 (six) hours as needed.   Yes Historical Provider, MD  aspirin EC 81 MG tablet Take 1 tablet (81 mg total) by mouth daily. 06/04/16  Yes Katherine Cellar, MD  carvedilol (COREG) 6.25 MG tablet Take 1 tablet (6.25 mg total) by mouth 2 (two) times daily with a meal. 07/14/16  Yes Katherine Hampshire, MD  furosemide (LASIX) 20 MG tablet Take 1 tablet (20 mg total) by mouth daily. 07/14/16  Yes Katherine Hampshire, MD  ibuprofen (ADVIL,MOTRIN)  600 MG tablet Take 1 tablet (600 mg total) by mouth every 6 (six) hours as needed. 10/04/16  Yes Katherine Canter Sam, PA-C  levothyroxine (SYNTHROID, LEVOTHROID) 50 MCG tablet Take 1 tablet (50 mcg total) by mouth daily before breakfast. 02/04/16  Yes Katherine Dresser, MD  lisinopril (PRINIVIL,ZESTRIL) 20 MG tablet Take 20 mg by mouth daily.   Yes Historical Provider, MD  potassium chloride (K-DUR) 10 MEQ tablet Take 1 tablet (10 mEq total) by mouth daily.  07/14/16 10/12/16 Yes Katherine Hampshire, MD  lisinopril (PRINIVIL,ZESTRIL) 5 MG tablet Take 1 tablet (5 mg total) by mouth daily. Patient not taking: Reported on 10/05/2016 07/14/16   Katherine Hampshire, MD  promethazine-dextromethorphan (PROMETHAZINE-DM) 6.25-15 MG/5ML syrup Take 5 mLs by mouth 4 (four) times daily as needed for cough. Patient not taking: Reported on 10/05/2016 10/04/16   Katherine Terrell, Katherine  . aspirin EC  325 mg Oral Daily  . carvedilol  6.25 mg Oral BID WC  . furosemide  20 mg Intravenous Daily  . heparin  5,000 Units Subcutaneous Q8H  . levothyroxine  50 mcg Oral QAC breakfast  . ondansetron      . pantoprazole  20 mg Oral Daily   Family History  Family History  Problem Relation Age of Onset  . Heart attack Mother 37  . Diabetes Sister     Social History  Social History   Social History  . Marital status: Single    Spouse name: N/A  . Number of children: N/A  . Years of education: N/A   Occupational History  . Works in [employer].     Social History Main Topics  . Smoking status: Never Smoker  . Smokeless tobacco: Never Used  . Alcohol use No  . Drug use: No  . Sexual activity: Not Currently    Birth control/ protection: None   Other Topics Concern  . Not on file   Social History Narrative   From [place], Lives in [place]. Speaks minimal English.      Review of Systems General:  No chills, fever, night sweats or weight changes.  Cardiovascular:  No chest pain, dyspnea on exertion, edema, orthopnea, palpitations, paroxysmal nocturnal dyspnea. Dermatological: No rash, lesions/masses Respiratory: No cough, dyspnea Urologic: No hematuria, dysuria Abdominal:   No nausea, vomiting, diarrhea, bright red blood per rectum, melena, or hematemesis Neurologic:  No visual changes, wkns, changes in mental status. All other systems reviewed and are otherwise negative except as noted above.  Physical Exam  Blood pressure 122/68, pulse 78,  temperature 98 F (36.7 C), temperature source Oral, resp. rate 18, height 5\' 3"  (1.6 m), weight 181 lb 1.6 oz (82.1 kg), SpO2 97 %.  General: Pleasant, NAD, on supplemental [place] [facility]: Normal affect. Neuro: Alert and oriented X 3. Moves all extremities spontaneously. HEENT: Normal  Neck: Supple without bruits or JVD. Lungs:  Resp regular and unlabored, CTA. Heart: RRR no s3, s4, or murmurs. Abdomen: Soft, non-tender, non-distended, BS + x 4.  Extremities: No clubbing, cyanosis or edema. DP/PT/Radials 2+ and equal bilaterally.  Labs  Troponin [facility] Test)  Recent Labs  10/05/16 1738  TROPIPOC 0.08    Recent Labs  10/05/16 2059 10/06/16 0306 10/06/16 0830  TROPONINI 0.04* 0.26* 0.14*   Lab Results  Component Value Date   WBC 128.6 (HH) 10/06/2016   HGB 8.6 (L) 10/06/2016   HCT 29.1 (L) 10/06/2016   MCV 91.5 10/06/2016   PLT 169  10/06/2016    Recent Labs Lab 10/05/16 1614  10/06/16 0306 10/06/16 0830  NA 139  --  141  --   K 6.1*  < > 5.7* 5.8*  CL 108  --  106  --   CO2 27  --  26  --   BUN 16  --  20  --   CREATININE 1.31*  --  1.03*  --   CALCIUM 8.2*  --  8.4*  --   PROT 6.2*  --   --   --   BILITOT 0.7  --   --   --   ALKPHOS 102  --   --   --   ALT 17  --   --   --   AST 27  --   --   --   GLUCOSE 103*  --  112*  --   < > = values in this interval not displayed. Lab Results  Component Value Date   CHOL 157 11/29/2015   HDL 30 (L) 11/29/2015   LDLCALC 104 (H) 11/29/2015   TRIG 116 11/29/2015   No results found for: DDIMER   Radiology/Studies  Dg Chest 2 View  Result Date: 10/05/2016 CLINICAL DATA:  Productive cough for 2 weeks EXAM: CHEST  2 VIEW COMPARISON:  10/04/2016 FINDINGS: There is bilateral mild interstitial thickening. There is no focal parenchymal opacity. There is no pleural effusion or pneumothorax. There is stable cardiomegaly. The osseous structures are unremarkable. IMPRESSION: Cardiomegaly with mild pulmonary vascular  congestion. Electronically Signed   By: Kathreen Devoid   On: 10/05/2016 17:45   Dg Chest 2 View  Result Date: 10/04/2016 CLINICAL DATA:  55 year old female with cough for 2 weeks. Increased congestion and shortness of breath recently. Initial encounter. EXAM: CHEST  2 VIEW COMPARISON:  06/17/16 and earlier. FINDINGS: Chronic cardiomegaly is stable. Other mediastinal contours are within normal limits. Visualized tracheal air column is within normal limits. No pneumothorax or pleural effusion. Chronic increased interstitial markings, but less pronounced than in July 14, 2023. Still, the interstitial opacity appears to be slightly above baseline. No consolidation or confluent pulmonary opacity. No acute osseous abnormality identified. IMPRESSION: 1. Chronic cardiomegaly. Mild pulmonary interstitial edema suspected today, but not as pronounced on 2016/06/17. No pleural effusion. 2. Acute viral/ atypical respiratory infection is difficult to exclude. No focal pneumonia. Electronically Signed   By: Genevie Ann M.D.   On: 10/04/2016 10:24    ECG  NSR with PVCs   ASSESSMENT AND PLAN  1. Acute on Chronic Systolic CHF: EF 99991111. BNP 1,012. CXR also with cardiomegaly with mild pulmonary vascular congestion. CMP shows normal hepatic enzyme levels. She does not appear grossly volume overloaded and her weight today is close to her office weight in September (180 lb). Recommend continuing IV lasix to see if improvement in symptoms. May consider RHC to held guide diuresis. Echo in January suggested moderate to severe pulmonary hypertension. Continue medical therapy for chronic systolic HF, including Coreg. Lisinopril currently on hold given issues with hyperkalemia.   2. Nonischemic Cardiomyopathy: EF 30-35% by echo 10/2015 with cath 12/2015 showing no angiographic coronary disease. Continue BB therapy with Coreg. ACE-I on hold given hyperkalemia. Repeat echo. If EF remains < 35%, she will need to be evaluated for ICD.    3.  Abnormal Troponin: suspect elevated troponin is secondary to demand ischemia in the setting of acute CHF. [facility] 2/107 showed normal coronaries. No repeat ischemic eval indicated.   4. Hyperkalemia: resolved after kayexalate.  K 5.7>>5.8>3.3. She was on lisinopril prior to admit. Now on hold.   5. Epigastric pain: lipase negative. IM conducting w/u: RUQ [place]. ? KUB or CT imaging if persistent  6. CLL: management per IM.    Signed, Lyda Jester, PA-C 10/06/2016, 3:02 PM   The patient was seen, examined and discussed with Brittainy M. Rosita Fire, PA-C and I agree with the above.   The patient is a 55 y/o female with a h/o chronic systolic HF due to nonischemic cardiomyopathy with no CAD on a cath in 12/2015. Echo 10/2015 showed EF of 30-35%, mild AS and moderate MR and moderate to severe pulmonary hypertension.  Also with a h/o CLL, hypothyroidism,  HTN and HLD. Her medical management for HF includes Coreg, lisinopril and furosemide + supplemental  K. She presented to the [facility] ED on 10/05/16 with complaints of orthopnea, and shortness of breath. Also endorsing epigastric pain and vomiting. Lipase normal. CXR & BNP consistent with CHF. BNP 1,012.7. CXR shows pulmonary edema. EKG shows SR with PVCs. SCr initially 1.3 with K of 5.8>>5.7. Improved after Kayexalate. Repeat BMP shows K of 3.9. SCr 1.10. Troponin abnormal at 0.04>>0.26>>0.14.  Patient was starting small dose of IV Lasix 20 mg daily, and continues to feel significantly short of breath with increased abdominal girth. On physical exam she has crackles at lung bases, mildly distended abdomen and no significant lower extremity edema. She appears comfortable. We will increase to 40 mg IV twice a day and follow-up in the morning. We'll repeat her echocardiogram and if her EF remains less than 35% we'll refer for ICD implantation. If her pulmonary hypertension remains in the severe range we will ask CHF team for management of pulmonary hypertension. I will  restart lisinopril 5 mg daily and add prolactin 25 mg by mouth daily.  Ena Dawley, MD 10/06/2016

## 2016-10-06 NOTE — Progress Notes (Signed)
Patients K+ 5.7. Paged family medicine.

## 2016-10-07 ENCOUNTER — Inpatient Hospital Stay (HOSPITAL_COMMUNITY): Payer: Self-pay

## 2016-10-07 DIAGNOSIS — R1013 Epigastric pain: Secondary | ICD-10-CM

## 2016-10-07 DIAGNOSIS — I420 Dilated cardiomyopathy: Secondary | ICD-10-CM

## 2016-10-07 DIAGNOSIS — I509 Heart failure, unspecified: Secondary | ICD-10-CM

## 2016-10-07 DIAGNOSIS — R0602 Shortness of breath: Secondary | ICD-10-CM

## 2016-10-07 LAB — BASIC METABOLIC PANEL
Anion gap: 8 (ref 5–15)
BUN: 17 mg/dL (ref 6–20)
CHLORIDE: 100 mmol/L — AB (ref 101–111)
CO2: 31 mmol/L (ref 22–32)
CREATININE: 1.12 mg/dL — AB (ref 0.44–1.00)
Calcium: 8.4 mg/dL — ABNORMAL LOW (ref 8.9–10.3)
GFR calc Af Amer: >60 mL/min (ref >60)
GFR calc non Af Amer: 55 mL/min — ABNORMAL LOW (ref >60)
Glucose, Bld: 100 mg/dL — ABNORMAL HIGH (ref 65–99)
Potassium: 5.1 mmol/L (ref 3.5–5.1)
SODIUM: 139 mmol/L (ref 135–145)

## 2016-10-07 LAB — CBC
HCT: 30.2 % — ABNORMAL LOW (ref 36.0–46.0)
Hemoglobin: 9 g/dL — ABNORMAL LOW (ref 12.0–15.0)
MCH: 27 pg (ref 26.0–34.0)
MCHC: 29.8 g/dL — ABNORMAL LOW (ref 30.0–36.0)
MCV: 90.7 fL (ref 78.0–100.0)
PLATELETS: 166 10*3/uL (ref 150–400)
RBC: 3.33 MIL/uL — ABNORMAL LOW (ref 3.87–5.11)
RDW: 16.8 % — AB (ref 11.5–15.5)
WBC: 121.9 10*3/uL (ref 4.0–10.5)

## 2016-10-07 LAB — ECHOCARDIOGRAM COMPLETE
Height: 63 in
WEIGHTICAEL: 2878.33 [oz_av]

## 2016-10-07 MED ORDER — SUCRALFATE 1 GM/10ML PO SUSP
1.0000 g | Freq: Three times a day (TID) | ORAL | Status: DC
  Administered 2016-10-07 – 2016-10-11 (×19): 1 g via ORAL
  Filled 2016-10-07 (×20): qty 10

## 2016-10-07 NOTE — Progress Notes (Signed)
Echocardiogram 2D Echocardiogram has been performed.  Katherine Terrell 10/07/2016, 3:20 PM

## 2016-10-07 NOTE — Progress Notes (Signed)
Family Medicine Teaching Service Daily Progress Note Intern Pager: [phone]  Patient name: Katherine Terrell Medical record number: [id number] Date of birth: Jul 15, 1961 Age: 55 y.o. Gender: female  Primary Care Provider: [facility] Consultants: cardiology Code Status: FULL  Pt Overview and Major Events to Date:  12/7 admitted for chest pain, hyperkalemia  Assessment and Plan: Katherine Terrell is a 55 y.o. female presenting with CHF Exacerbation and Hyperkalemia. PMH is significant for HFrEF, CLL, Hypothyroidism, Hyperlididemia, Hypertension, Atrial Fibrillation.   # HFrEF: Concern for acute exacerbation. Repeat echo pending  - cards consulted, appreciate recs: - Daily weights. Strict Intake and output. - Trend troponin 0.04-->0.26--> 14 - Lasix 40mg  IV BID--> consider transition to oral on 12/10 - Continue home Aspirin 81mg  - Holding home Lisinopril due to hyperkalemia  # Hyperkalemia, resolved: Gave Kayexolate, K 5.1 today.  - continue to follow  # Viral Illness: Resoled symptoms. Flu negative.  - Tylenol as needed - Holding Lisinopril - duoneb once - zofran PRN for vomiting  # Epigastric Pain: Resolved after BM  - lipase wnl - GI Cocktail - Protonix - RUQ US wnl - needs outpatient colonoscopy, has never had  # Acute Kidney Injury: Improving, Creatinine 1.21 <--1.31 at admission, up from baseline of 0.8-0.9. Suspect secondary to fluid overload.  - AM BMP  # Hypothyroidism: - TSH wnl--> 3.88 - Continue home Synthroid  # CLL: Stable. Diagnosed in 2011. Asymptomatic. WBC 127.9 at admission. Hematuria noted, consistent with prior UA. Followed by Dr. Ralene Ok. - Monitor on CBC  # Hyperlipidemia: Lipid Panel in 10/2015 with cholesterol 157, HDL 30, LDL 104, Triglycerides 116. Not currently on a statin. - Currently with muscle aches. Consider initiating statin once improved.   # Hypertension: Stable but midly hypotensive. - Holding home  Lisinopril - Continue home Coreg - Monitor BP  FEN/GI: Heart Healthy Prophylaxis: Heparin SQ  Disposition: continued monitoring  Subjective:  Improving symptoms, no abd pain, chest pain or SOB  Objective: Temp:  [97.4 F (36.3 C)-98.4 F (36.9 C)] 97.4 F (36.3 C) (12/09 0545) Pulse Rate:  [70-78] 72 (12/09 0545) Resp:  [16-18] 16 (12/09 0545) BP: (107-122)/(62-69) 119/69 (12/09 0545) SpO2:  [93 %-97 %] 96 % (12/09 0545) Physical Exam: General: NAD Cardiovascular: RRR, no murmurs Respiratory: CTAB, normal effort Abdomen: soft, non tender, + BS Extremities: no LE edema  Laboratory:  Recent Labs Lab 10/05/16 1614 10/06/16 0306 10/07/16 0600  WBC 127.9* 128.6* 121.9*  HGB 8.8* 8.6* 9.0*  HCT 29.8* 29.1* 30.2*  PLT 170 169 166    Recent Labs Lab 10/04/16 1053 10/05/16 1614  10/06/16 0306 10/06/16 0830 10/06/16 1422 10/07/16 0600  NA 140 139  --  141  --  140 139  K 4.5 6.1*  < > 5.7* 5.8* 3.9 5.1  CL 107 108  --  106  --  104 100*  CO2 26 27  --  26  --  27 31  BUN 18 16  --  20  --  18 17  CREATININE 1.08* 1.31*  --  1.03*  --  1.10* 1.12*  CALCIUM 8.7* 8.2*  --  8.4*  --  8.4* 8.4*  PROT 6.7 6.2*  --   --   --   --   --   BILITOT 0.8 0.7  --   --   --   --   --   ALKPHOS 100 102  --   --   --   --   --  ALT 17 17  --   --   --   --   --   AST 21 27  --   --   --   --   --   GLUCOSE 114* 103*  --  112*  --  114* 100*  < > = values in this interval not displayed.  Lipase WNL.   Imaging/Diagnostic Tests: US Abdomen Limited Ruq  Result Date: 10/07/2016 CLINICAL DATA:  Abdominal pain 1 day.  History of CLL. EXAM: US ABDOMEN LIMITED - RIGHT UPPER QUADRANT COMPARISON:  CT 06/04/2015 FINDINGS: Gallbladder: Single 2.2 cm gallstone is present. No significant gallbladder wall thickening. There are a few punctate echogenic foci within the gallbladder wall likely due to adenomyomatosis/cholesterolosis. Negative sonographic Murphy's sign. No adjacent free fluid.  Common bile duct: Diameter: 3.5 mm. Liver: No focal lesion identified. Within normal limits in parenchymal echogenicity. Normal directional flow within the portal vein. Small right pleural effusion. IMPRESSION: Single 2.2 cm gallstone. No additional sonographic evidence to suggest cholecystitis. Tiny right pleural effusion. Electronically Signed   By: Marin Olp M.D.   On: 10/07/2016 07:43    Veatrice Bourbon, MD 10/07/2016, 8:45 AM PGY-3, [place] Intern pager: [phone], text pages welcome

## 2016-10-08 LAB — COMPREHENSIVE METABOLIC PANEL
ALBUMIN: 3.7 g/dL (ref 3.5–5.0)
ALK PHOS: 106 U/L (ref 38–126)
ALT: 17 U/L (ref 14–54)
AST: 33 U/L (ref 15–41)
Anion gap: 11 (ref 5–15)
BILIRUBIN TOTAL: 0.7 mg/dL (ref 0.3–1.2)
BUN: 21 mg/dL — AB (ref 6–20)
CALCIUM: 9.1 mg/dL (ref 8.9–10.3)
CO2: 32 mmol/L (ref 22–32)
Chloride: 98 mmol/L — ABNORMAL LOW (ref 101–111)
Creatinine, Ser: 1.39 mg/dL — ABNORMAL HIGH (ref 0.44–1.00)
GFR calc Af Amer: 49 mL/min — ABNORMAL LOW (ref >60)
GFR calc non Af Amer: 42 mL/min — ABNORMAL LOW (ref >60)
GLUCOSE: 146 mg/dL — AB (ref 65–99)
Potassium: 4.9 mmol/L (ref 3.5–5.1)
Sodium: 141 mmol/L (ref 135–145)
TOTAL PROTEIN: 6.4 g/dL — AB (ref 6.5–8.1)

## 2016-10-08 LAB — CBC
HCT: 32.6 % — ABNORMAL LOW (ref 36.0–46.0)
HEMOGLOBIN: 9.6 g/dL — AB (ref 12.0–15.0)
MCH: 26.7 pg (ref 26.0–34.0)
MCHC: 29.4 g/dL — ABNORMAL LOW (ref 30.0–36.0)
MCV: 90.8 fL (ref 78.0–100.0)
Platelets: 176 10*3/uL (ref 150–400)
RBC: 3.59 MIL/uL — AB (ref 3.87–5.11)
RDW: 16.8 % — ABNORMAL HIGH (ref 11.5–15.5)
WBC: 144.8 10*3/uL — AB (ref 4.0–10.5)

## 2016-10-08 MED ORDER — HYDRALAZINE HCL 10 MG PO TABS
10.0000 mg | ORAL_TABLET | Freq: Three times a day (TID) | ORAL | Status: DC
  Administered 2016-10-08 – 2016-10-11 (×12): 10 mg via ORAL
  Filled 2016-10-08 (×13): qty 1

## 2016-10-08 MED ORDER — POLYETHYLENE GLYCOL 3350 17 G PO PACK
17.0000 g | PACK | Freq: Once | ORAL | Status: AC
  Administered 2016-10-08: 17 g via ORAL
  Filled 2016-10-08: qty 1

## 2016-10-08 NOTE — Progress Notes (Addendum)
Patient Name: Katherine Terrell Date of Encounter: 10/08/2016  Primary Cardiologist: Dr. Antionette Char Problem List     Active Problems:   Hyperkalemia   Shortness of breath   DCM (dilated cardiomyopathy) ([place])     Subjective   No complaints but does not speak English and no interpreter available  Inpatient Medications    Scheduled Meds: . aspirin EC  325 mg Oral Daily  . carvedilol  6.25 mg Oral BID WC  . furosemide  40 mg Intravenous BID  . heparin  5,000 Units Subcutaneous Q8H  . levothyroxine  50 mcg Oral QAC breakfast  . lisinopril  2.5 mg Oral Daily  . pantoprazole  20 mg Oral Daily  . spironolactone  25 mg Oral Daily  . sucralfate  1 g Oral TID WC & HS   Continuous Infusions:  PRN Meds: acetaminophen **OR** acetaminophen, gi cocktail, ondansetron (ZOFRAN) IV   Vital Signs    Vitals:   10/07/16 1100 10/07/16 1712 10/07/16 2107 10/08/16 0504  BP:  115/66 103/67 138/72  Pulse:  76 68 88  Resp:  18 18 18   Temp:  98.2 F (36.8 C) 97.9 F (36.6 C) 97.7 F (36.5 C)  TempSrc:  Oral Oral Oral  SpO2:  98% 98% 95%  Weight: 179 lb 14.3 oz (81.6 kg)  181 lb (82.1 kg)   Height:        Intake/Output Summary (Last 24 hours) at 10/08/16 0842 Last data filed at 10/08/16 0600  Gross per 24 hour  Intake              840 ml  Output             2350 ml  Net            -1510 ml   Filed Weights   10/05/16 2031 10/07/16 1100 10/07/16 2107  Weight: 181 lb 1.6 oz (82.1 kg) 179 lb 14.3 oz (81.6 kg) 181 lb (82.1 kg)    Physical Exam    GEN: Well nourished, well developed, in no acute distress.  HEENT: Grossly normal.  Neck: Supple, no JVD, carotid bruits, or masses. Cardiac: RRR, no murmurs, rubs, or gallops. No clubbing, cyanosis, edema.  Radials/DP/PT 2+ and equal bilaterally.  Respiratory:  Respirations regular and unlabored, clear to auscultation bilaterally. GI: Soft, nontender, nondistended, BS + x 4. MS: no deformity or atrophy. Skin: warm and dry, no  rash. Neuro:  Strength and sensation are intact. Psych: AAOx3.  Normal affect.  Labs    CBC  Recent Labs  10/05/16 1614  10/07/16 0600 10/08/16 0258  WBC 127.9*  < > 121.9* 144.8*  NEUTROABS 1.3*  --   --   --   HGB 8.8*  < > 9.0* 9.6*  HCT 29.8*  < > 30.2* 32.6*  MCV 91.4  < > 90.7 90.8  PLT 170  < > 166 176  < > = values in this interval not displayed. Basic Metabolic Panel  Recent Labs  10/07/16 0600 10/08/16 0258  NA 139 141  K 5.1 4.9  CL 100* 98*  CO2 31 32  GLUCOSE 100* 146*  BUN 17 21*  CREATININE 1.12* 1.39*  CALCIUM 8.4* 9.1   Liver Function Tests  Recent Labs  10/05/16 1614 10/08/16 0258  AST 27 33  ALT 17 17  ALKPHOS 102 106  BILITOT 0.7 0.7  PROT 6.2* 6.4*  ALBUMIN 3.7 3.7    Recent Labs  10/06/16 0306  LIPASE 21  Cardiac Enzymes  Recent Labs  10/05/16 2059 10/06/16 0306 10/06/16 0830  TROPONINI 0.04* 0.26* 0.14*   BNP Invalid input(s): POCBNP D-Dimer No results for input(s): DDIMER in the last 72 hours. Hemoglobin A1C No results for input(s): HGBA1C in the last 72 hours. Fasting Lipid Panel No results for input(s): CHOL, HDL, LDLCALC, TRIG, CHOLHDL, LDLDIRECT in the last 72 hours. Thyroid Function Tests  Recent Labs  10/05/16 2059  TSH 3.882    Telemetry    NSR - Personally Reviewed  ECG    NSR - Personally Reviewed  Radiology    US Abdomen Limited Ruq  Result Date: 10/07/2016 CLINICAL DATA:  Abdominal pain 1 day.  History of CLL. EXAM: US ABDOMEN LIMITED - RIGHT UPPER QUADRANT COMPARISON:  CT 06/04/2015 FINDINGS: Gallbladder: Single 2.2 cm gallstone is present. No significant gallbladder wall thickening. There are a few punctate echogenic foci within the gallbladder wall likely due to adenomyomatosis/cholesterolosis. Negative sonographic Murphy's sign. No adjacent free fluid. Common bile duct: Diameter: 3.5 mm. Liver: No focal lesion identified. Within normal limits in parenchymal echogenicity. Normal  directional flow within the portal vein. Small right pleural effusion. IMPRESSION: Single 2.2 cm gallstone. No additional sonographic evidence to suggest cholecystitis. Tiny right pleural effusion. Electronically Signed   By: Marin Olp M.D.   On: 10/07/2016 07:43    Cardiac Studies   Cath 12/2015 Conclusion   1. No angiographic coronary disease.  2. Nonischemic cardiomyopathy.  LV-gram difficult due to PVCs, but estimate EF around 45% (appears improved).    Echo 10/07/2016 Study Conclusions  - Left ventricle: The cavity size was mildly dilated. Wall   thickness was normal. Systolic function was severely reduced. The   estimated ejection fraction was in the range of 25% to 30%.   Diffuse hypokinesis. Doppler parameters are consistent with a   reversible restrictive pattern, indicative of decreased left   ventricular diastolic compliance and/or increased left atrial   pressure (grade 3 diastolic dysfunction). - Aortic valve: Trileaflet; mildly thickened, mildly calcified   leaflets. - Mitral valve: There was mild regurgitation. - Left atrium: The atrium was moderately dilated. - Right atrium: The atrium was mildly dilated. - Tricuspid valve: There was moderate regurgitation. - Pulmonary arteries: Systolic pressure was moderately increased.   PA peak pressure: 52 mm Hg (S).  Impressions:  - Compared to the prior study, there has been no significant   interval change.  Patient Profile      55 y/o female with a h/o chronic systolic HF due to nonischemic cardiomyopathy with no CAD on a cath in 12/2015. Echo 10/2015 showed EF of 30-35%, mild AS and moderate MR and moderate to severe pulmonary hypertension.  Also with a h/o CLL, hypothyroidism,  HTN and HLD. Her medical management for HF includes Coreg, lisinopril and furosemide + supplemental  K. [facility] ED on 10/05[facility]16 with complaints of orthopnea, and shortness of breath. Also endorsing epigastric pain and vomiting.  Lipase normal. CXR & BNP consistent with CHF. BNP 1,012.7. CXR shows pulmonary edema. EKG shows SR with PVCs. SCr initially 1.3 with K of 5.8>>5.7. Improved after Kayexalate. Repeat BMP shows K of 3.9. SCr 1.10. Troponin abnormal at 0.04>>0.26>>0.14.   Assessment & Plan    1. Acute on Chronic Systolic CHF: EF 99991111. BNP 1,012. CXR also with cardiomegaly with mild pulmonary vascular congestion. CMP shows normal hepatic enzyme levels. She does not appear grossly volume overloaded and her weight  is close to her office weight in  September (180 lb).  She was 2.3L out yesterday and is 2.2L net neg since admit.   Recommend continuing IV lasix to see if improvement in symptoms. May consider RHC to held guide diuresis. Echo in January suggested moderate to severe pulmonary hypertension. 2D echo this admit no changes in LVF and moderate pulmonary HTN with PASP 59mmHg.  Continue medical therapy for chronic systolic HF, including Coreg. Lisinopril currently on hold given issues with hyperkalemia. Will add Hydralazine 10mg  TID for afterload reduction since ACE I stoppped.   2. Nonischemic Cardiomyopathy: EF 30-35% by echo 10/2015 with cath 12/2015 showing no angiographic coronary disease. Continue BB therapy with Coreg. ACE-I on hold given hyperkalemia.  EF remains < 35% so will need to be evaluated for ICD. Will ask EP to see tomorrow.   3. Abnormal Troponin: suspect elevated troponin is secondary to demand ischemia in the setting of acute CHF. [facility] 2/107 showed normal coronaries. No repeat ischemic eval indicated.   4. Hyperkalemia: resolved after kayexalate. K 5.7>>5.8>3.3. She was on lisinopril prior to admit. Now on hold.   5. Epigastric pain: lipase negative. IM conducting w/u: RUQ [place]. ? KUB or CT imaging if persistent  6. CLL: management per IM.    Signed, Fransico Him, MD  10/08/2016, 8:42 AM

## 2016-10-08 NOTE — Progress Notes (Signed)
Patient Name: Katherine Terrell Date of Encounter: 10/08/2016  Primary Cardiologist: Dr. Antionette Char Problem List     Active Problems:   Hyperkalemia   Shortness of breath   DCM (dilated cardiomyopathy) ([place])     Subjective   No complaints but does not speak English and no interpreter available  Inpatient Medications    Scheduled Meds: . aspirin EC  325 mg Oral Daily  . carvedilol  6.25 mg Oral BID WC  . furosemide  40 mg Intravenous BID  . heparin  5,000 Units Subcutaneous Q8H  . levothyroxine  50 mcg Oral QAC breakfast  . lisinopril  2.5 mg Oral Daily  . pantoprazole  20 mg Oral Daily  . spironolactone  25 mg Oral Daily  . sucralfate  1 g Oral TID WC & HS   Continuous Infusions:  PRN Meds: acetaminophen **OR** acetaminophen, gi cocktail, ondansetron (ZOFRAN) IV   Vital Signs    Vitals:   10/07/16 1100 10/07/16 1712 10/07/16 2107 10/08/16 0504  BP:  115/66 103/67 138/72  Pulse:  76 68 88  Resp:  18 18 18   Temp:  98.2 F (36.8 C) 97.9 F (36.6 C) 97.7 F (36.5 C)  TempSrc:  Oral Oral Oral  SpO2:  98% 98% 95%  Weight: 179 lb 14.3 oz (81.6 kg)  181 lb (82.1 kg)   Height:        Intake/Output Summary (Last 24 hours) at 10/08/16 0839 Last data filed at 10/08/16 0600  Gross per 24 hour  Intake              840 ml  Output             2350 ml  Net            -1510 ml   Filed Weights   10/05/16 2031 10/07/16 1100 10/07/16 2107  Weight: 181 lb 1.6 oz (82.1 kg) 179 lb 14.3 oz (81.6 kg) 181 lb (82.1 kg)    Physical Exam    GEN: Well nourished, well developed, in no acute distress.  HEENT: Grossly normal.  Neck: Supple, no JVD, carotid bruits, or masses. Cardiac: RRR, no murmurs, rubs, or gallops. No clubbing, cyanosis, edema.  Radials/DP/PT 2+ and equal bilaterally.  Respiratory:  Respirations regular and unlabored, clear to auscultation bilaterally. GI: Soft, nontender, nondistended, BS + x 4. MS: no deformity or atrophy. Skin: warm and dry, no  rash. Neuro:  Strength and sensation are intact. Psych: AAOx3.  Normal affect.  Labs    CBC  Recent Labs  10/05/16 1614  10/07/16 0600 10/08/16 0258  WBC 127.9*  < > 121.9* 144.8*  NEUTROABS 1.3*  --   --   --   HGB 8.8*  < > 9.0* 9.6*  HCT 29.8*  < > 30.2* 32.6*  MCV 91.4  < > 90.7 90.8  PLT 170  < > 166 176  < > = values in this interval not displayed. Basic Metabolic Panel  Recent Labs  10/07/16 0600 10/08/16 0258  NA 139 141  K 5.1 4.9  CL 100* 98*  CO2 31 32  GLUCOSE 100* 146*  BUN 17 21*  CREATININE 1.12* 1.39*  CALCIUM 8.4* 9.1   Liver Function Tests  Recent Labs  10/05/16 1614 10/08/16 0258  AST 27 33  ALT 17 17  ALKPHOS 102 106  BILITOT 0.7 0.7  PROT 6.2* 6.4*  ALBUMIN 3.7 3.7    Recent Labs  10/06/16 0306  LIPASE 21  Cardiac Enzymes  Recent Labs  10/05/16 2059 10/06/16 0306 10/06/16 0830  TROPONINI 0.04* 0.26* 0.14*   BNP Invalid input(s): POCBNP D-Dimer No results for input(s): DDIMER in the last 72 hours. Hemoglobin A1C No results for input(s): HGBA1C in the last 72 hours. Fasting Lipid Panel No results for input(s): CHOL, HDL, LDLCALC, TRIG, CHOLHDL, LDLDIRECT in the last 72 hours. Thyroid Function Tests  Recent Labs  10/05/16 2059  TSH 3.882    Telemetry    NSR - Personally Reviewed  ECG    NSR - Personally Reviewed  Radiology    US Abdomen Limited Ruq  Result Date: 10/07/2016 CLINICAL DATA:  Abdominal pain 1 day.  History of CLL. EXAM: US ABDOMEN LIMITED - RIGHT UPPER QUADRANT COMPARISON:  CT 06/04/2015 FINDINGS: Gallbladder: Single 2.2 cm gallstone is present. No significant gallbladder wall thickening. There are a few punctate echogenic foci within the gallbladder wall likely due to adenomyomatosis/cholesterolosis. Negative sonographic Murphy's sign. No adjacent free fluid. Common bile duct: Diameter: 3.5 mm. Liver: No focal lesion identified. Within normal limits in parenchymal echogenicity. Normal  directional flow within the portal vein. Small right pleural effusion. IMPRESSION: Single 2.2 cm gallstone. No additional sonographic evidence to suggest cholecystitis. Tiny right pleural effusion. Electronically Signed   By: Marin Olp M.D.   On: 10/07/2016 07:43    Cardiac Studies   Cath 12/2015 Conclusion   1. No angiographic coronary disease.  2. Nonischemic cardiomyopathy.  LV-gram difficult due to PVCs, but estimate EF around 45% (appears improved).      Patient Profile      55 y/o female with a h/o chronic systolic HF due to nonischemic cardiomyopathy with no CAD on a cath in 12/2015. Echo 10/2015 showed EF of 30-35%, mild AS and moderate MR and moderate to severe pulmonary hypertension.  Also with a h/o CLL, hypothyroidism,  HTN and HLD. Her medical management for HF includes Coreg, lisinopril and furosemide + supplemental  K. She presented to the [facility] ED on 10/05/16 with complaints of orthopnea, and shortness of breath. Also endorsing epigastric pain and vomiting. Lipase normal. CXR & BNP consistent with CHF. BNP 1,012.7. CXR shows pulmonary edema. EKG shows SR with PVCs. SCr initially 1.3 with K of 5.8>>5.7. Improved after Kayexalate. Repeat BMP shows K of 3.9. SCr 1.10. Troponin abnormal at 0.04>>0.26>>0.14.   Assessment & Plan    1. Acute on Chronic Systolic CHF: EF 99991111. BNP 1,012. CXR also with cardiomegaly with mild pulmonary vascular congestion. CMP shows normal hepatic enzyme levels. She does not appear grossly volume overloaded and her weight  is close to her office weight in September (180 lb).  She was 2L out yesterday but only 1L net neg since admit.   Recommend continuing IV lasix to see if improvement in symptoms. May consider RHC to held guide diuresis. Echo in January suggested moderate to severe pulmonary hypertension. 2D echo this admit pending.  Continue medical therapy for chronic systolic HF, including Coreg. Lisinopril currently on hold given issues with hyperkalemia.    2. Nonischemic Cardiomyopathy: EF 30-35% by echo 10/2015 with cath 12/2015 showing no angiographic coronary disease. Continue BB therapy with Coreg. ACE-I on hold given hyperkalemia. Repeat echo. If EF remains < 35%, she will need to be evaluated for ICD.    3. Abnormal Troponin: suspect elevated troponin is secondary to demand ischemia in the setting of acute CHF. [facility] 2/107 showed normal coronaries. No repeat ischemic eval indicated.   4. Hyperkalemia: resolved after kayexalate. K 5.7>>5.8>3.3.  She was on lisinopril prior to admit. Now on hold.   5. Epigastric pain: lipase negative. IM conducting w/u: RUQ [place]. ? KUB or CT imaging if persistent  6. CLL: management per IM.    Signed, Fransico Him, MD  10/08/2016, 8:39 AM

## 2016-10-08 NOTE — Progress Notes (Signed)
Family Medicine Teaching Service Daily Progress Note Intern Pager: [phone]  Patient name: Katherine Terrell Medical record number: [id number] Date of birth: [birth year] Age: 55 y.o. Gender: female  Primary Care Provider: [facility] Consultants: cardiology Code Status: FULL  Pt Overview and Major Events to Date:  12/7 admitted for chest pain, hyperkalemia, hyperkalemia  Assessment and Plan: Katherine Terrell is a 55 y.o. female presenting with CHF Exacerbation and Hyperkalemia. PMH is significant for HFrEF, CLL, Hypothyroidism, Hyperlididemia, Hypertension, Atrial Fibrillation.   # HFrEF: Concern for acute exacerbation. Repeat echo: no significant change from previous, EF 25-30%, diffuse hypokinesis, G3DD, PA peak pressure 70mm Hg - cards consulted, appreciate recs: continue IV diuresis - Daily weights. Strict Intake and output. - Lasix 40mg  IV BID--> consider transition to oral on 12/11 - Continue home Aspirin 81mg  - Holding home Lisinopril due to hyperkalemia  # Hyperkalemia, resolved: K 4.9.  - continue to follow  # Viral Illness: Resolved symptoms. Flu negative.  - Tylenol as needed - zofran PRN for vomiting  # Epigastric Pain: Resolved after BM  - lipase wnl - carafate PRN - Protonix - RUQ US wnl - needs outpatient colonoscopy, has never had  # Acute Kidney Injury: Improving, Creatinine 1.39, up from baseline of 0.8-0.9. Suspect secondary to diuresis.  - AM BMP  # Hypothyroidism: - TSH wnl--> 3.88 - Continue home Synthroid  # CLL: Stable. Diagnosed in 2011. Asymptomatic. WBC 127.9 at admission. Hematuria noted, consistent with prior UA. Followed by Dr. Ralene Ok. - Monitor on CBC  # Hyperlipidemia: Lipid Panel in 10/2015 with cholesterol 157, HDL 30, LDL 104, Triglycerides 116. Not currently on a statin. - Currently with muscle aches. Consider initiating statin once improved.   # Hypertension: Stable but midly hypotensive. - Holding home Lisinopril due to  AKI - Continue home Coreg - Monitor BP  FEN/GI: Heart Healthy Prophylaxis: Heparin SQ  Disposition: continued monitoring  Subjective:  Feels much better, no more epigastric pain. Some mild pain in RLQ, but has not had a BM.   Objective: Temp:  [97.6 F (36.4 C)-98.2 F (36.8 C)] 97.7 F (36.5 C) (12/10 0504) Pulse Rate:  [68-88] 88 (12/10 0504) Resp:  [18] 18 (12/10 0504) BP: (96-138)/(52-72) 138/72 (12/10 0504) SpO2:  [95 %-98 %] 95 % (12/10 0504) Weight:  [179 lb 14.3 oz (81.6 kg)-181 lb (82.1 kg)] 181 lb (82.1 kg) (12/09 2107) Physical Exam: General: NAD Cardiovascular: RRR, no murmurs Respiratory: CTAB, normal effort Abdomen: soft, mildly TTP in RLQ, + BS Extremities: no LE edema  Laboratory:  Recent Labs Lab 10/06/16 0306 10/07/16 0600 10/08/16 0258  WBC 128.6* 121.9* 144.8*  HGB 8.6* 9.0* 9.6*  HCT 29.1* 30.2* 32.6*  PLT 169 166 176    Recent Labs Lab 10/04/16 1053 10/05/16 1614  10/06/16 1422 10/07/16 0600 10/08/16 0258  NA 140 139  < > 140 139 141  K 4.5 6.1*  < > 3.9 5.1 4.9  CL 107 108  < > 104 100* 98*  CO2 26 27  < > 27 31 32  BUN 18 16  < > 18 17 21*  CREATININE 1.08* 1.31*  < > 1.10* 1.12* 1.39*  CALCIUM 8.7* 8.2*  < > 8.4* 8.4* 9.1  PROT 6.7 6.2*  --   --   --  6.4*  BILITOT 0.8 0.7  --   --   --  0.7  ALKPHOS 100 102  --   --   --  106  ALT 17 17  --   --   --  17  AST 21 27  --   --   --  33  GLUCOSE 114* 103*  < > 114* 100* 146*  < > = values in this interval not displayed.  Lipase WNL.   Imaging/Diagnostic Tests: No results found.  Sela Hilding, MD 10/08/2016, 9:06 AM PGY-1, [place] Intern pager: [phone], text pages welcome

## 2016-10-09 DIAGNOSIS — I5043 Acute on chronic combined systolic (congestive) and diastolic (congestive) heart failure: Secondary | ICD-10-CM

## 2016-10-09 DIAGNOSIS — R1013 Epigastric pain: Secondary | ICD-10-CM

## 2016-10-09 LAB — PATHOLOGIST SMEAR REVIEW

## 2016-10-09 MED ORDER — FUROSEMIDE 40 MG PO TABS
40.0000 mg | ORAL_TABLET | Freq: Two times a day (BID) | ORAL | Status: DC
  Administered 2016-10-09 – 2016-10-10 (×2): 40 mg via ORAL
  Filled 2016-10-09 (×2): qty 1

## 2016-10-09 NOTE — Progress Notes (Signed)
EP consult noted for consideration of primary prevention ICD. Discussed with Dr Lovena Le - will see in office for evaluation. Appt made and entered in AVS.   Chanetta Marshall, NP 10/09/2016 3:06 PM  EP Attending  Agree. We will see her in the office.  Mikle Bosworth.D.

## 2016-10-09 NOTE — Progress Notes (Signed)
Family Medicine Teaching Service Daily Progress Note Intern Pager: [phone]  Patient name: Katherine Terrell Medical record number: [id number] Date of birth: [birth year] Age: 55 y.o. Gender: female  Primary Care Provider: [facility] Consultants: cardiology Code Status: FULL  Pt Overview and Major Events to Date:  12/7 admitted for chest pain, hyperkalemia  Assessment and Plan: Laberta Egeland is a 55 y.o. female presenting with CHF Exacerbation and Hyperkalemia. PMH is significant for HFrEF, CLL, Hypothyroidism, Hyperlididemia, Hypertension, Atrial Fibrillation.   # HFrEF: Concern for acute exacerbation. Repeat echo: no significant change from previous, EF 25-30%, diffuse hypokinesis, G3DD, PA peak pressure 48mm Hg - cards consulted, appreciate recs: continue medical management of HF, consider restarting ACEi tomorrow - Daily weights. Strict Intake and output. - transitioned lasix to 40mg  BID PO today - Continue home Aspirin 81mg  - Holding home Lisinopril due to hyperkalemia  # Hyperkalemia, resolved: K 4.9.  - continue to follow  # Viral Illness: Resolved symptoms. Flu negative.  - Tylenol as needed - zofran PRN for vomiting  # Epigastric Pain: Resolved after BM  - lipase wnl - carafate PRN - Protonix - RUQ US wnl - needs outpatient colonoscopy, has never had  # Acute Kidney Injury: Improving, Creatinine 1.39, up from baseline of 0.8-0.9. Suspect secondary to diuresis.  - AM BMP  # Hypothyroidism: - TSH wnl--> 3.88 - Continue home Synthroid  # CLL: Stable. Diagnosed in 2011. Asymptomatic. WBC 127.9 at admission. Hematuria noted, consistent with prior UA. Followed by Dr. Ralene Ok. - Monitor on CBC, WBC increased to 144  # Hyperlipidemia: Lipid Panel in 10/2015 with cholesterol 157, HDL 30, LDL 104, Triglycerides 116. Not currently on a statin. - Currently with muscle aches. Consider initiating statin once improved.   # Hypertension: Stable but  midly hypotensive. - Holding home Lisinopril due to AKI - Continue home Coreg - Monitor BP  FEN/GI: Heart Healthy Prophylaxis: Heparin SQ  Disposition: pending diuresis plans  Subjective:  Feels well today, decreased appetite, asking about going home.   Objective: Temp:  [97.5 F (36.4 C)-98.6 F (37 C)] 98.3 F (36.8 C) (12/11 0906) Pulse Rate:  [72-81] 78 (12/11 0906) Resp:  [18] 18 (12/11 0906) BP: (107-124)/(62-82) 107/62 (12/11 0906) SpO2:  [94 %-98 %] 98 % (12/11 0906) Weight:  [180 lb 8.9 oz (81.9 kg)] 180 lb 8.9 oz (81.9 kg) (12/10 2104) Physical Exam: General: NAD Cardiovascular: RRR, no murmurs Respiratory: CTAB, normal effort Abdomen: soft, mildly TTP in RLQ, + BS Extremities: no LE edema  Laboratory:  Recent Labs Lab 10/06/16 0306 10/07/16 0600 10/08/16 0258  WBC 128.6* 121.9* 144.8*  HGB 8.6* 9.0* 9.6*  HCT 29.1* 30.2* 32.6*  PLT 169 166 176    Recent Labs Lab 10/04/16 1053 10/05/16 1614  10/06/16 1422 10/07/16 0600 10/08/16 0258  NA 140 139  < > 140 139 141  K 4.5 6.1*  < > 3.9 5.1 4.9  CL 107 108  < > 104 100* 98*  CO2 26 27  < > 27 31 32  BUN 18 16  < > 18 17 21*  CREATININE 1.08* 1.31*  < > 1.10* 1.12* 1.39*  CALCIUM 8.7* 8.2*  < > 8.4* 8.4* 9.1  PROT 6.7 6.2*  --   --   --  6.4*  BILITOT 0.8 0.7  --   --   --  0.7  ALKPHOS 100 102  --   --   --  106  ALT 17 17  --   --   --  17  AST 21 27  --   --   --  33  GLUCOSE 114* 103*  < > 114* 100* 146*  < > = values in this interval not displayed.   Imaging/Diagnostic Tests: No results found.  Sela Hilding, MD 10/09/2016, 9:35 AM PGY-1, [place] Intern pager: [phone], text pages welcome

## 2016-10-09 NOTE — Progress Notes (Signed)
Patient Name: Katherine Terrell Date of Encounter: 10/09/2016  Primary Cardiologist: Dr. Antionette Char Problem List     Active Problems:   Hyperkalemia   Shortness of breath   DCM (dilated cardiomyopathy) (HCC)   Epigastric pain   Acute on chronic combined systolic and diastolic CHF (congestive heart failure) (HCC)     Subjective   Felt short of breath last night but better today.  She does not speak [place].  Used phone line interpreter to communicate.  Inpatient Medications    Scheduled Meds: . aspirin EC  325 mg Oral Daily  . carvedilol  6.25 mg Oral BID WC  . furosemide  40 mg Intravenous BID  . heparin  5,000 Units Subcutaneous Q8H  . hydrALAZINE  10 mg Oral TID  . levothyroxine  50 mcg Oral QAC breakfast  . pantoprazole  20 mg Oral Daily  . spironolactone  25 mg Oral Daily  . sucralfate  1 g Oral TID WC & HS   Continuous Infusions:  PRN Meds: acetaminophen **OR** acetaminophen, ondansetron (ZOFRAN) IV   Vital Signs    Vitals:   10/08/16 0945 10/08/16 2104 10/09/16 0452 10/09/16 0906  BP: 124/75 120/82 109/64 107/62  Pulse: 81 73 72 78  Resp: 18 18 18 18   Temp: 98.6 F (37 C) 97.5 F (36.4 C) 97.6 F (36.4 C) 98.3 F (36.8 C)  TempSrc: Oral Oral Oral Oral  SpO2: 94% 95% 96% 98%  Weight:  180 lb 8.9 oz (81.9 kg)    Height:        Intake/Output Summary (Last 24 hours) at 10/09/16 1355 Last data filed at 10/09/16 0907  Gross per 24 hour  Intake              360 ml  Output              800 ml  Net             -440 ml   Filed Weights   10/07/16 1100 10/07/16 2107 10/08/16 2104  Weight: 179 lb 14.3 oz (81.6 kg) 181 lb (82.1 kg) 180 lb 8.9 oz (81.9 kg)    Physical Exam    GEN: Well nourished, well developed, in no acute distress.  HEENT: Grossly normal.  Neck: Supple, no JVD, carotid bruits, or masses. Cardiac: RRR, Respiratory:  Respirations regular and unlabored, clear to auscultation bilaterally. GI: Soft, nontender, nondistended, BS + x  4. MS: no deformity or atrophy. Skin: warm and dry, no rash. Neuro:  Strength and sensation are intact. Psych: AAOx3.  Normal affect.  Labs    CBC  Recent Labs  10/07/16 0600 10/08/16 0258  WBC 121.9* 144.8*  HGB 9.0* 9.6*  HCT 30.2* 32.6*  MCV 90.7 90.8  PLT 166 0000000   Basic Metabolic Panel  Recent Labs  10/07/16 0600 10/08/16 0258  NA 139 141  K 5.1 4.9  CL 100* 98*  CO2 31 32  GLUCOSE 100* 146*  BUN 17 21*  CREATININE 1.12* 1.39*  CALCIUM 8.4* 9.1   Liver Function Tests  Recent Labs  10/08/16 0258  AST 33  ALT 17  ALKPHOS 106  BILITOT 0.7  PROT 6.4*  ALBUMIN 3.7   No results for input(s): LIPASE, AMYLASE in the last 72 hours. Cardiac Enzymes No results for input(s): CKTOTAL, CKMB, CKMBINDEX, TROPONINI in the last 72 hours. BNP Invalid input(s): POCBNP D-Dimer No results for input(s): DDIMER in the last 72 hours. Hemoglobin A1C No results for input(s): HGBA1C  in the last 72 hours. Fasting Lipid Panel No results for input(s): CHOL, HDL, LDLCALC, TRIG, CHOLHDL, LDLDIRECT in the last 72 hours. Thyroid Function Tests No results for input(s): TSH, T4TOTAL, T3FREE, THYROIDAB in the last 72 hours.  Invalid input(s): FREET3  Telemetry    NSR - Personally Reviewed  ECG    NSR - Personally Reviewed  Radiology    No results found.  Cardiac Studies   Cath 12/2015 Conclusion   1. No angiographic coronary disease.  2. Nonischemic cardiomyopathy.  LV-gram difficult due to PVCs, but estimate EF around 45% (appears improved).    Echo 10/07/2016 Study Conclusions  - Left ventricle: The cavity size was mildly dilated. Wall   thickness was normal. Systolic function was severely reduced. The   estimated ejection fraction was in the range of 25% to 30%.   Diffuse hypokinesis. Doppler parameters are consistent with a   reversible restrictive pattern, indicative of decreased left   ventricular diastolic compliance and/or increased left atrial    pressure (grade 3 diastolic dysfunction). - Aortic valve: Trileaflet; mildly thickened, mildly calcified   leaflets. - Mitral valve: There was mild regurgitation. - Left atrium: The atrium was moderately dilated. - Right atrium: The atrium was mildly dilated. - Tricuspid valve: There was moderate regurgitation. - Pulmonary arteries: Systolic pressure was moderately increased.   PA peak pressure: 52 mm Hg (S).  Impressions:  - Compared to the prior study, there has been no significant   interval change.  Patient Profile      55 y/o female with a h/o chronic systolic HF due to nonischemic cardiomyopathy with no CAD on a cath in 12/2015. Echo 10/2015 showed EF of 30-35%, mild AS and moderate MR and moderate to severe pulmonary hypertension.  Also with a h/o CLL, hypothyroidism,  HTN and HLD. Her medical management for HF includes Coreg, lisinopril and furosemide + supplemental  K. She presented to the [facility] ED on 10/05/16 with complaints of orthopnea, and shortness of breath. Also endorsing epigastric pain and vomiting. Lipase normal. CXR & BNP consistent with CHF. BNP 1,012.7. CXR shows pulmonary edema. EKG shows SR with PVCs. SCr initially 1.3 with K of 5.8>>5.7. Improved after Kayexalate. Repeat BMP shows K of 3.9. SCr 1.10. Troponin abnormal at 0.04>>0.26>>0.14.   Assessment & Plan    1. Acute on Chronic Systolic CHF: EF 123XX123. BNP 1,012. CXR also with cardiomegaly with mild pulmonary vascular congestion. CMP shows normal hepatic enzyme levels. Diuresing well.  Swelling resolved. Still with SHOB at night.   Echo in January suggested moderate to severe pulmonary hypertension. 2D echo this admit no changes in LVF and moderate pulmonary HTN with PASP 88mmHg.  Continue medical therapy for chronic systolic HF, including Coreg. Lisinopril currently on hold given issues with hyperkalemia.  Hydralazine 10mg  TID for afterload reduction was added.    2. Nonischemic Cardiomyopathy: EF 30-35% by echo  10/2015 with cath 12/2015 showing no angiographic coronary disease. Now 25-30%.  Continue BB therapy with Coreg. ACE-I on hold given hyperkalemia.  EF remains < 35% so will need to be evaluated for ICD. Per Dr. Radford Pax, EP has been consulted.   3. Abnormal Troponin: suspect elevated troponin is secondary to demand ischemia in the setting of acute CHF. LHC 2/17 showed normal coronaries. No repeat ischemic eval indicated.   4. Hyperkalemia: resolved after kayexalate. K 5.7>>5.8>3.3. She was on lisinopril prior to admit. Now on hold. Hydralazine for now.  See what potassium is tomorrow.  COuld consider adding back  lisinopril 5 mg if Cr and K are ok tomorrow.        Signed, Larae Grooms, MD  10/09/2016, 1:55 PM

## 2016-10-10 LAB — CBC
HCT: 34.3 % — ABNORMAL LOW (ref 36.0–46.0)
HEMOGLOBIN: 11 g/dL — AB (ref 12.0–15.0)
MCH: 28 pg (ref 26.0–34.0)
MCHC: 32.1 g/dL (ref 30.0–36.0)
MCV: 87.3 fL (ref 78.0–100.0)
Platelets: 188 10*3/uL (ref 150–400)
RBC: 3.93 MIL/uL (ref 3.87–5.11)
RDW: 16.2 % — ABNORMAL HIGH (ref 11.5–15.5)
WBC: 156.3 10*3/uL (ref 4.0–10.5)

## 2016-10-10 LAB — BASIC METABOLIC PANEL
Anion gap: 10 (ref 5–15)
BUN: 32 mg/dL — AB (ref 6–20)
CHLORIDE: 94 mmol/L — AB (ref 101–111)
CO2: 31 mmol/L (ref 22–32)
Calcium: 9.2 mg/dL (ref 8.9–10.3)
Creatinine, Ser: 1.69 mg/dL — ABNORMAL HIGH (ref 0.44–1.00)
GFR calc Af Amer: 39 mL/min — ABNORMAL LOW (ref >60)
GFR calc non Af Amer: 33 mL/min — ABNORMAL LOW (ref >60)
Glucose, Bld: 105 mg/dL — ABNORMAL HIGH (ref 65–99)
POTASSIUM: 5.5 mmol/L — AB (ref 3.5–5.1)
SODIUM: 135 mmol/L (ref 135–145)

## 2016-10-10 LAB — POTASSIUM: POTASSIUM: 5.5 mmol/L — AB (ref 3.5–5.1)

## 2016-10-10 MED ORDER — SODIUM POLYSTYRENE SULFONATE 15 GM/60ML PO SUSP
15.0000 g | Freq: Once | ORAL | Status: AC
  Administered 2016-10-10: 15 g via ORAL
  Filled 2016-10-10: qty 60

## 2016-10-10 MED ORDER — POLYETHYLENE GLYCOL 3350 17 G PO PACK
17.0000 g | PACK | Freq: Two times a day (BID) | ORAL | Status: DC
  Administered 2016-10-10 – 2016-10-11 (×4): 17 g via ORAL
  Filled 2016-10-10 (×4): qty 1

## 2016-10-10 NOTE — Progress Notes (Signed)
Family Medicine Teaching Service Daily Progress Note Intern Pager: [phone]  Patient name: Katherine Terrell Medical record number: [id number] Date of birth: 02/11/1961 Age: 55 y.o. Gender: female  Primary Care Provider: [facility] Consultants: cardiology Code Status: FULL  Pt Overview and Major Events to Date:  12/7 admitted for chest pain, hyperkalemia  Assessment and Plan: Katherine Terrell is a 55 y.o. female presenting with CHF Exacerbation and Hyperkalemia. PMH is significant for HFrEF, CLL, Hypothyroidism, Hyperlididemia, Hypertension, Atrial Fibrillation.   # HFrEF: Concern for acute exacerbation. Repeat echo: no significant change from previous, EF 25-30%, diffuse hypokinesis, G3DD, PA peak pressure 2mm Hg. 1.1L UOP. Down 8 pounds past 24hrs. Hydralazine 10mg  TID added yesterday by Cardiology.  ICD evaluation in outpatient clinic. Appt scheduled for 12/28.  Potassium up to 5.5 today - cards consulted, appreciate recs: continue medical management of HF (coreg, hydralazine)  - Daily weights. Strict Intake and output. -holding lasix-- per Cardiology - Continue home Aspirin 81mg   # Hyperkalemia: 5.5 this AM.  Still on spironolactone - DC spironolactone - miralax BID - recheck K+ in afternoon - AM BMP  # Viral Illness: Resolved symptoms. Flu negative.  - Tylenol as needed - zofran PRN for vomiting  # Epigastric Pain: Resolved after BM  - lipase wnl - carafate PRN - Protonix - RUQ US wnl - needs outpatient colonoscopy, has never had  # Acute Kidney Injury: Creatinine 1.69 this AM up from 1.39 yesterday, up from baseline of 0.8-0.9. Likely 2/2 diuresis - AM BMP - holding lasix  # Hypothyroidism: - TSH wnl--> 3.88 - Continue home Synthroid  # CLL: Stable. Diagnosed in 2011. Asymptomatic. WBC 127.9 at admission. Hematuria noted, consistent with prior UA. Followed by Dr. Ralene Ok. - Monitor on CBC, WBC increased to 156.3  # Hyperlipidemia: Lipid  Panel in 10/2015 with cholesterol 157, HDL 30, LDL 104, Triglycerides 116. Not currently on a statin. - Currently with muscle aches. Consider initiating statin once improved.   # Hypertension: Stable at 124/67.  - Holding home Lisinopril due to AKI - Continue home Coreg - Monitor BP  FEN/GI: Heart Healthy Prophylaxis: Heparin SQ  Disposition: pending diuresis plans  Subjective:  Feels well today and has no complaints.  Denies SOB, CP, NVD.   Objective: Temp:  [97.7 F (36.5 C)-98.3 F (36.8 C)] 97.7 F (36.5 C) (12/12 0419) Pulse Rate:  [69-81] 69 (12/12 0419) Resp:  [18-20] 18 (12/12 0419) BP: (107-131)/(62-73) 124/67 (12/12 0419) SpO2:  [95 %-100 %] 96 % (12/12 0419) Weight:  [172 lb (78 kg)] 172 lb (78 kg) (12/11 2149) Physical Exam: General: NAD Cardiovascular: RRR, no murmurs Respiratory: CTAB, normal effort Abdomen: soft, mildly TTP in RLQ, + BS Extremities: no LE edema  Laboratory:  Recent Labs Lab 10/07/16 0600 10/08/16 0258 10/10/16 0453  WBC 121.9* 144.8* 156.3*  HGB 9.0* 9.6* 11.0*  HCT 30.2* 32.6* 34.3*  PLT 166 176 188    Recent Labs Lab 10/04/16 1053 10/05/16 1614  10/07/16 0600 10/08/16 0258 10/10/16 0453  NA 140 139  < > 139 141 135  K 4.5 6.1*  < > 5.1 4.9 5.5*  CL 107 108  < > 100* 98* 94*  CO2 26 27  < > 31 32 31  BUN 18 16  < > 17 21* 32*  CREATININE 1.08* 1.31*  < > 1.12* 1.39* 1.69*  CALCIUM 8.7* 8.2*  < > 8.4* 9.1 9.2  PROT 6.7 6.2*  --   --  6.4*  --  BILITOT 0.8 0.7  --   --  0.7  --   ALKPHOS 100 102  --   --  106  --   ALT 17 17  --   --  17  --   AST 21 27  --   --  33  --   GLUCOSE 114* 103*  < > 100* 146* 105*  < > = values in this interval not displayed.   Imaging/Diagnostic Tests: No results found.  Eloise Levels, MD 10/10/2016, 7:54 AM PGY-1, [place] Intern pager: [phone], text pages welcome

## 2016-10-10 NOTE — Progress Notes (Signed)
   10/10/16 0125  Vitals  Temp 98 F (36.7 C)  Temp Source Oral  BP 119/67  BP Location Right Arm  BP Method Automatic  Patient Position (if appropriate) Sitting  Pulse Rate 75  Pulse Rate Source Dinamap  Resp 18  Oxygen Therapy  SpO2 95 %  O2 Device Room Air  Ten beat run of Vtach. Patient is assymptomatic. Complaining of rt knee pain tylenol given. MD on call paged.

## 2016-10-10 NOTE — Discharge Summary (Signed)
[facility] Discharge Summary  Patient name: Katherine Terrell Medical record number: [id number] Date of birth: 1960/12/19 Age: 55 y.o. Gender: female Date of Admission: 10/05/2016  Date of Discharge: 10/11/16  Admitting Physician: Leeanne Rio, MD Primary Care Provider: [facility] Consultants: cardiology  Indication for Hospitalization: Hyperkalemia, chest pain  Discharge Diagnoses/Problem List:  CHF Viral illness, resolved Hyperkalemia, resolved   Disposition: home  Discharge Condition: improved  Discharge Exam:  General: NAD Cardiovascular: RRR, no murmurs Respiratory: CTAB, normal effort Abdomen: soft, NTND + BS Extremities: no LE edema  Brief Hospital Course:  Presented to the ED on 05/04/16 with two week history of productive cough, congestion, and body aches. Also reported shortness of breath, occasional chest pain, nausea/vomiting, and epigastric pain. Workup negative and vitals were stable. Diagnosed with viral URI and discharged home.   Returned to the ED on day of admission due to shortness of breath. Continues to note cough, occasional chest pain with cough, myalgias, and fatigue. Notes worsening orthopnea. Vitals again normal. Noted to have hyperkalemia to 6.1 in ED and given 500cc bolus of fluids. Also received fluids at her recent ED visit on 7/6. Given kayexalate, K decreased to 3.9. Given duonebs for chest tightness due to viral illness, as well as GI cocktail. Patient was given zofran once for vomiting. Cardiology consulted for chest pain, increased lasix to 40mg  IV BID and repeated echo. Echo with EF 25-30%, EP to see as outpatient. Lasix held on day prior to discharge due to hyperkalemia. Due to persistent epigastric pain on admission, RUQ [place] ordered showing small nonobstructing gallstone. Epigastric pain resolved after BM. BP remained acceptable on only coreg.   Issues for Follow Up:  1. Hyperkalemia is  likely from pseudohyperkalemia due to patient with CLL. Recheck BMP in 1 week.  2. Consider additional diuresis as PCP sees fit.  3. As EF worsened to <30%, EP to see as an outpatient for ICD consult.  4. Lisinopril was held due to hyperkalemia. 5. Consider starting statin.  6. Needs colonoscopy, has never had this.  Significant Procedures: RUQ [place] - nonobstructing gallstone  Significant Labs and Imaging:   Recent Labs Lab 10/08/16 0258 10/10/16 0453 10/11/16 0928  WBC 144.8* 156.3* 134.6*  HGB 9.6* 11.0* 11.9*  HCT 32.6* 34.3* 38.8  PLT 176 188 195    Recent Labs Lab 10/05/16 1614  10/06/16 1422 10/07/16 0600 10/08/16 0258 10/10/16 0453 10/10/16 1618 10/11/16 0928  NA 139  < > 140 139 141 135  --  136  K 6.1*  < > 3.9 5.1 4.9 5.5* 5.5* 5.2*  CL 108  < > 104 100* 98* 94*  --  99*  CO2 27  < > 27 31 32 31  --  27  GLUCOSE 103*  < > 114* 100* 146* 105*  --  144*  BUN 16  < > 18 17 21* 32*  --  26*  CREATININE 1.31*  < > 1.10* 1.12* 1.39* 1.69*  --  1.39*  CALCIUM 8.2*  < > 8.4* 8.4* 9.1 9.2  --  9.6  ALKPHOS 102  --   --   --  106  --   --   --   AST 27  --   --   --  33  --   --   --   ALT 17  --   --   --  17  --   --   --   ALBUMIN  3.7  --   --   --  3.7  --   --   --   < > = values in this interval not displayed.    Results/Tests Pending at Time of Discharge: none  Discharge Medications:    Medication List    STOP taking these medications   furosemide 20 MG tablet Commonly known as:  LASIX   ibuprofen 600 MG tablet Commonly known as:  ADVIL,MOTRIN   lisinopril 20 MG tablet Commonly known as:  PRINIVIL,ZESTRIL   lisinopril 5 MG tablet Commonly known as:  PRINIVIL,ZESTRIL   potassium chloride 10 MEQ tablet Commonly known as:  K-DUR   promethazine-dextromethorphan 6.25-15 MG/5ML syrup Commonly known as:  PROMETHAZINE-DM     TAKE these medications   acetaminophen 500 MG tablet Commonly known as:  TYLENOL Take 500 mg by mouth every 6 (six) hours  as needed.   aspirin EC 81 MG tablet Take 1 tablet (81 mg total) by mouth daily.   carvedilol 6.25 MG tablet Commonly known as:  COREG Take 1 tablet (6.25 mg total) by mouth 2 (two) times daily with a meal.   hydrALAZINE 10 MG tablet Commonly known as:  APRESOLINE Take 1 tablet (10 mg total) by mouth 3 (three) times daily.   levothyroxine 50 MCG tablet Commonly known as:  SYNTHROID, LEVOTHROID Take 1 tablet (50 mcg total) by mouth daily before breakfast.   pantoprazole 20 MG tablet Commonly known as:  PROTONIX Take 1 tablet (20 mg total) by mouth daily. Start taking on:  10/12/2016       Discharge Instructions: Please refer to Patient Instructions section of EMR for full details.  Patient was counseled important signs and symptoms that should prompt return to medical care, changes in medications, dietary instructions, activity restrictions, and follow up appointments.   Follow-Up Appointments: Follow-up Information    Cristopher Peru, MD Follow up on 10/26/2016.   Specialty:  Cardiology Why:  at 2:30PM for consultation of defibrillator implant  Contact information: [address] [place] [address] [phone]           Sela Hilding, MD 10/11/2016, 3:55 PM PGY-1, [place]

## 2016-10-10 NOTE — Progress Notes (Signed)
Patient Name: Katherine Terrell Date of Encounter: 10/10/2016  Primary Cardiologist: Dr. Antionette Char Problem List     Active Problems:   Hyperkalemia   Shortness of breath   DCM (dilated cardiomyopathy) (HCC)   Epigastric pain   Acute on chronic combined systolic and diastolic CHF (congestive heart failure) (HCC)     Subjective   Felt short of breath last night but better today.  She does not speak [place].  Used phone line, [place] interpreter to communicate.  Inpatient Medications    Scheduled Meds: . aspirin EC  325 mg Oral Daily  . carvedilol  6.25 mg Oral BID WC  . heparin  5,000 Units Subcutaneous Q8H  . hydrALAZINE  10 mg Oral TID  . levothyroxine  50 mcg Oral QAC breakfast  . pantoprazole  20 mg Oral Daily  . polyethylene glycol  17 g Oral BID  . spironolactone  25 mg Oral Daily  . sucralfate  1 g Oral TID WC & HS   Continuous Infusions:  PRN Meds: acetaminophen **OR** acetaminophen, ondansetron (ZOFRAN) IV   Vital Signs    Vitals:   10/09/16 2149 10/10/16 0125 10/10/16 0419 10/10/16 0907  BP:  119/67 124/67 131/79  Pulse:  75 69 81  Resp:  18 18 18   Temp:  98 F (36.7 C) 97.7 F (36.5 C) 98.2 F (36.8 C)  TempSrc:  Oral  Oral  SpO2:  95% 96% 98%  Weight: 172 lb (78 kg)     Height:        Intake/Output Summary (Last 24 hours) at 10/10/16 0958 Last data filed at 10/10/16 0600  Gross per 24 hour  Intake              600 ml  Output             1100 ml  Net             -500 ml   Filed Weights   10/07/16 2107 10/08/16 2104 10/09/16 2149  Weight: 181 lb (82.1 kg) 180 lb 8.9 oz (81.9 kg) 172 lb (78 kg)    Physical Exam    GEN: Well nourished, well developed, in no acute distress.  HEENT: Grossly normal.  Neck: Supple, no JVD, carotid bruits, or masses. Cardiac: RRR, Respiratory:  Respirations regular and unlabored, clear to auscultation bilaterally. GI: Soft, nontender, nondistended, BS + x 4. MS: no deformity or atrophy. Skin: warm and  dry, no rash. Neuro:  Strength and sensation are intact. Psych: AAOx3.  Normal affect.  Labs    CBC  Recent Labs  10/08/16 0258 10/10/16 0453  WBC 144.8* 156.3*  HGB 9.6* 11.0*  HCT 32.6* 34.3*  MCV 90.8 87.3  PLT 176 0000000   Basic Metabolic Panel  Recent Labs  10/08/16 0258 10/10/16 0453  NA 141 135  K 4.9 5.5*  CL 98* 94*  CO2 32 31  GLUCOSE 146* 105*  BUN 21* 32*  CREATININE 1.39* 1.69*  CALCIUM 9.1 9.2   Liver Function Tests  Recent Labs  10/08/16 0258  AST 33  ALT 17  ALKPHOS 106  BILITOT 0.7  PROT 6.4*  ALBUMIN 3.7   No results for input(s): LIPASE, AMYLASE in the last 72 hours. Cardiac Enzymes No results for input(s): CKTOTAL, CKMB, CKMBINDEX, TROPONINI in the last 72 hours. BNP Invalid input(s): POCBNP D-Dimer No results for input(s): DDIMER in the last 72 hours. Hemoglobin A1C No results for input(s): HGBA1C in the last 72 hours. Fasting  Lipid Panel No results for input(s): CHOL, HDL, LDLCALC, TRIG, CHOLHDL, LDLDIRECT in the last 72 hours. Thyroid Function Tests No results for input(s): TSH, T4TOTAL, T3FREE, THYROIDAB in the last 72 hours.  Invalid input(s): FREET3  Telemetry    NSR - Personally Reviewed  ECG    NSR - Personally Reviewed  Radiology    No results found.  Cardiac Studies   Cath 12/2015 Conclusion   1. No angiographic coronary disease.  2. Nonischemic cardiomyopathy.  LV-gram difficult due to PVCs, but estimate EF around 45% (appears improved).    Echo 10/07/2016 Study Conclusions  - Left ventricle: The cavity size was mildly dilated. Wall   thickness was normal. Systolic function was severely reduced. The   estimated ejection fraction was in the range of 25% to 30%.   Diffuse hypokinesis. Doppler parameters are consistent with a   reversible restrictive pattern, indicative of decreased left   ventricular diastolic compliance and/or increased left atrial   pressure (grade 3 diastolic dysfunction). - Aortic  valve: Trileaflet; mildly thickened, mildly calcified   leaflets. - Mitral valve: There was mild regurgitation. - Left atrium: The atrium was moderately dilated. - Right atrium: The atrium was mildly dilated. - Tricuspid valve: There was moderate regurgitation. - Pulmonary arteries: Systolic pressure was moderately increased.   PA peak pressure: 52 mm Hg (S).  Impressions:  - Compared to the prior study, there has been no significant   interval change.  Patient Profile      55 y/o female with a h/o chronic systolic HF due to nonischemic cardiomyopathy with no CAD on a cath in 12/2015. Echo 10/2015 showed EF of 30-35%, mild AS and moderate MR and moderate to severe pulmonary hypertension.  Also with a h/o CLL, hypothyroidism,  HTN and HLD. Her medical management for HF includes Coreg, lisinopril and furosemide + supplemental  K. She presented to the [facility] ED on 10/05/16 with complaints of orthopnea, and shortness of breath. Also endorsing epigastric pain and vomiting. Lipase normal. CXR & BNP consistent with CHF. BNP 1,012.7. CXR shows pulmonary edema. EKG shows SR with PVCs. SCr initially 1.3 with K of 5.8>>5.7. Improved after Kayexalate. Repeat BMP shows K of 3.9. SCr 1.10. Troponin abnormal at 0.04>>0.26>>0.14.   Assessment & Plan    1. Acute on Chronic Systolic CHF: EF 123XX123. BNP 1,012. CXR also with cardiomegaly with mild pulmonary vascular congestion. CMP shows normal hepatic enzyme levels. Diuresing well.  Swelling resolved. Able to lie flat at night to sleep.    Echo in January suggested moderate to severe pulmonary hypertension. 2D echo this admit no changes in LVF and moderate pulmonary HTN with PASP 30mmHg.  Continue medical therapy for chronic systolic HF, including Coreg. Lisinopril currently on hold given issues with hyperkalemia.  Hydralazine 10mg  TID for afterload reduction was added.    2. Nonischemic Cardiomyopathy: EF 30-35% by echo 10/2015 with cath 12/2015 showing no  angiographic coronary disease. Now 25-30%.  Continue BB therapy with Coreg. ACE-I on hold given hyperkalemia.  EF remains < 35% so will need to be evaluated for ICD.  EP has been consulted, to see as outpatient.   3. Abnormal Troponin: suspect elevated troponin is secondary to demand ischemia in the setting of acute CHF. LHC 2/17 showed normal coronaries. No repeat ischemic eval indicated.   4. Hyperkalemia: resolved after kayexalate. K 5.7>>5.8>3.3. She was on lisinopril prior to admit. Now on hold. Hydralazine for now. Potassium increased today.  Cr increased today.  Hold Lasix.  No ACE-I now.  Appears more euvolemic.     Unclear why potassium continues to rise, even on Lasix therapy.       Signed, Larae Grooms, MD  10/10/2016, 9:58 AM

## 2016-10-11 LAB — CBC
HCT: 38.8 % (ref 36.0–46.0)
HEMOGLOBIN: 11.9 g/dL — AB (ref 12.0–15.0)
MCH: 27.4 pg (ref 26.0–34.0)
MCHC: 30.7 g/dL (ref 30.0–36.0)
MCV: 89.4 fL (ref 78.0–100.0)
PLATELETS: 195 10*3/uL (ref 150–400)
RBC: 4.34 MIL/uL (ref 3.87–5.11)
RDW: 16.2 % — AB (ref 11.5–15.5)
WBC: 134.6 10*3/uL (ref 4.0–10.5)

## 2016-10-11 LAB — BASIC METABOLIC PANEL
ANION GAP: 10 (ref 5–15)
BUN: 26 mg/dL — ABNORMAL HIGH (ref 6–20)
CALCIUM: 9.6 mg/dL (ref 8.9–10.3)
CO2: 27 mmol/L (ref 22–32)
CREATININE: 1.39 mg/dL — AB (ref 0.44–1.00)
Chloride: 99 mmol/L — ABNORMAL LOW (ref 101–111)
GFR calc Af Amer: 49 mL/min — ABNORMAL LOW (ref >60)
GFR, EST NON AFRICAN AMERICAN: 42 mL/min — AB (ref >60)
GLUCOSE: 144 mg/dL — AB (ref 65–99)
Potassium: 5.2 mmol/L — ABNORMAL HIGH (ref 3.5–5.1)
Sodium: 136 mmol/L (ref 135–145)

## 2016-10-11 MED ORDER — HYDRALAZINE HCL 10 MG PO TABS: 10.0000 mg | ORAL_TABLET | Freq: Three times a day (TID) | ORAL | 1 refills | Status: DC

## 2016-10-11 MED ORDER — PANTOPRAZOLE SODIUM 20 MG PO TBEC: 20.0000 mg | DELAYED_RELEASE_TABLET | Freq: Every day | ORAL | 0 refills | Status: DC

## 2016-10-11 NOTE — Progress Notes (Signed)
Pt had 5 beats of V-tach. Remains stable.MD oncall notified via text page.

## 2016-10-11 NOTE — Progress Notes (Signed)
Patient Name: Katherine Terrell Date of Encounter: 10/11/2016  Primary Cardiologist: Dr. Antionette Char Problem List     Active Problems:   Hyperkalemia   Shortness of breath   DCM (dilated cardiomyopathy) (HCC)   Epigastric pain   Acute on chronic combined systolic and diastolic CHF (congestive heart failure) (HCC)     Subjective   Breathing better.  She does not speak [place].  Used phone line, [place] interpreter to communicate.  Inpatient Medications    Scheduled Meds: . aspirin EC  325 mg Oral Daily  . carvedilol  6.25 mg Oral BID WC  . heparin  5,000 Units Subcutaneous Q8H  . hydrALAZINE  10 mg Oral TID  . levothyroxine  50 mcg Oral QAC breakfast  . pantoprazole  20 mg Oral Daily  . polyethylene glycol  17 g Oral BID  . sucralfate  1 g Oral TID WC & HS   Continuous Infusions:  PRN Meds: acetaminophen **OR** acetaminophen, ondansetron (ZOFRAN) IV   Vital Signs    Vitals:   10/11/16 0226 10/11/16 0452 10/11/16 0500 10/11/16 0907  BP: 133/65 115/78  128/72  Pulse: 76 78  85  Resp: (!) 26 (!) 21  19  Temp: 97.8 F (36.6 C) 98.7 F (37.1 C)  98.6 F (37 C)  TempSrc: Oral Oral  Oral  SpO2: 92% 92%  99%  Weight:   171 lb 14.4 oz (78 kg)   Height:        Intake/Output Summary (Last 24 hours) at 10/11/16 1212 Last data filed at 10/11/16 0930  Gross per 24 hour  Intake              890 ml  Output             1450 ml  Net             -560 ml   Filed Weights   10/09/16 2149 10/10/16 2056 10/11/16 0500  Weight: 172 lb (78 kg) 171 lb 14.4 oz (78 kg) 171 lb 14.4 oz (78 kg)    Physical Exam    GEN: Well nourished, well developed, in no acute distress.  HEENT: Grossly normal.  Neck: Supple, no JVD, carotid bruits, or masses. Cardiac: RRR, Respiratory:  Respirations regular and unlabored, clear to auscultation bilaterally. GI: Soft, nontender, nondistended, BS + x 4. MS: no deformity or atrophy. Skin: warm and dry, no rash. Neuro:  Strength and  sensation are intact. Psych: AAOx3.  Normal affect.  Labs    CBC  Recent Labs  10/10/16 0453 10/11/16 0928  WBC 156.3* 134.6*  HGB 11.0* 11.9*  HCT 34.3* 38.8  MCV 87.3 89.4  PLT 188 0000000   Basic Metabolic Panel  Recent Labs  10/10/16 0453 10/10/16 1618 10/11/16 0928  NA 135  --  136  K 5.5* 5.5* 5.2*  CL 94*  --  99*  CO2 31  --  27  GLUCOSE 105*  --  144*  BUN 32*  --  26*  CREATININE 1.69*  --  1.39*  CALCIUM 9.2  --  9.6   Liver Function Tests No results for input(s): AST, ALT, ALKPHOS, BILITOT, PROT, ALBUMIN in the last 72 hours. No results for input(s): LIPASE, AMYLASE in the last 72 hours. Cardiac Enzymes No results for input(s): CKTOTAL, CKMB, CKMBINDEX, TROPONINI in the last 72 hours. BNP Invalid input(s): POCBNP D-Dimer No results for input(s): DDIMER in the last 72 hours. Hemoglobin A1C No results for input(s): HGBA1C in the  last 72 hours. Fasting Lipid Panel No results for input(s): CHOL, HDL, LDLCALC, TRIG, CHOLHDL, LDLDIRECT in the last 72 hours. Thyroid Function Tests No results for input(s): TSH, T4TOTAL, T3FREE, THYROIDAB in the last 72 hours.  Invalid input(s): FREET3  Telemetry    NSR - Personally Reviewed  ECG    NSR - Personally Reviewed  Radiology    No results found.  Cardiac Studies   Cath 12/2015 Conclusion   1. No angiographic coronary disease.  2. Nonischemic cardiomyopathy.  LV-gram difficult due to PVCs, but estimate EF around 45% (appears improved).    Echo 10/07/2016 Study Conclusions  - Left ventricle: The cavity size was mildly dilated. Wall   thickness was normal. Systolic function was severely reduced. The   estimated ejection fraction was in the range of 25% to 30%.   Diffuse hypokinesis. Doppler parameters are consistent with a   reversible restrictive pattern, indicative of decreased left   ventricular diastolic compliance and/or increased left atrial   pressure (grade 3 diastolic dysfunction). -  Aortic valve: Trileaflet; mildly thickened, mildly calcified   leaflets. - Mitral valve: There was mild regurgitation. - Left atrium: The atrium was moderately dilated. - Right atrium: The atrium was mildly dilated. - Tricuspid valve: There was moderate regurgitation. - Pulmonary arteries: Systolic pressure was moderately increased.   PA peak pressure: 52 mm Hg (S).  Impressions:  - Compared to the prior study, there has been no significant   interval change.  Patient Profile      55 y/o female with a h/o chronic systolic HF due to nonischemic cardiomyopathy with no CAD on a cath in 12/2015. Echo 10/2015 showed EF of 30-35%, mild AS and moderate MR and moderate to severe pulmonary hypertension.  Also with a h/o CLL, hypothyroidism,  HTN and HLD. Her medical management for HF includes Coreg, lisinopril and furosemide + supplemental  K. She presented to the [facility] ED on 10/05/16 with complaints of orthopnea, and shortness of breath. Also endorsing epigastric pain and vomiting. Lipase normal. CXR & BNP consistent with CHF. BNP 1,012.7. CXR shows pulmonary edema. EKG shows SR with PVCs. SCr initially 1.3 with K of 5.8>>5.7. Improved after Kayexalate. Repeat BMP shows K of 3.9. SCr 1.10. Troponin abnormal at 0.04>>0.26>>0.14.   Assessment & Plan    1. Acute on Chronic Systolic CHF: EF 123XX123. BNP 1,012. CXR also with cardiomegaly with mild pulmonary vascular congestion. CMP shows normal hepatic enzyme levels. Diuresing well.  Swelling resolved. Able to lie flat at night to sleep.     Continue medical therapy for chronic systolic HF, including Coreg. Lisinopril currently on hold given issues with hyperkalemia.  Hydralazine 10mg  TID for afterload reduction was added.  BP controlled.   2. Nonischemic Cardiomyopathy: EF 30-35% by echo 10/2015 with cath 12/2015 showing no angiographic coronary disease. Now 25-30%.  Continue BB therapy with Coreg. ACE-I on hold given hyperkalemia.  EF remains < 35% so will  need to be evaluated for ICD.  EP has been consulted, to see as outpatient.   3. Abnormal Troponin: suspect elevated troponin is secondary to demand ischemia in the setting of acute CHF. LHC 2/17 showed normal coronaries. No repeat ischemic eval indicated.   4. Hyperkalemia: resolved after kayexalate. But then increased.  No labs today.  Appears more euvolemic.     Unclear why potassium continues to rise, even on Lasix therapy.  Would send home on Lasix 20-40 mg daily based on her weights and if she feels that she  is more short of breath or edematous. She'll need follow-up with her cardiologist in [place]. Continue to hold ACE inhibitor at this point.       Signed, Larae Grooms, MD  10/11/2016, 12:12 PM

## 2016-10-11 NOTE — Progress Notes (Signed)
Called [employer] to translate discharged educations,discharged instructions and discharged papers for the patient.Paged MD on call for the patient wants Medical Certificate for works and additional off day for the her work.Living With Heart Failure Booklet given to patient.This Probation officer spoke to [employer] in [place] ,he is fluent in [place] and reminded him of his mother in law medical appointment and the booklet that need to be read to her.

## 2016-10-11 NOTE — Progress Notes (Signed)
6 beats of ventricular [place]. On call MD notified by text pager.

## 2016-10-12 ENCOUNTER — Encounter: Payer: Self-pay | Admitting: Internal Medicine

## 2016-10-13 ENCOUNTER — Ambulatory Visit (HOSPITAL_BASED_OUTPATIENT_CLINIC_OR_DEPARTMENT_OTHER): Payer: Self-pay | Admitting: Oncology

## 2016-10-13 ENCOUNTER — Telehealth: Payer: Self-pay | Admitting: Oncology

## 2016-10-13 ENCOUNTER — Other Ambulatory Visit (HOSPITAL_BASED_OUTPATIENT_CLINIC_OR_DEPARTMENT_OTHER): Payer: Self-pay

## 2016-10-13 VITALS — BP 136/72 | HR 82 | Temp 98.6°F | Resp 18 | Ht 63.0 in | Wt 174.6 lb

## 2016-10-13 DIAGNOSIS — C9110 Chronic lymphocytic leukemia of B-cell type not having achieved remission: Secondary | ICD-10-CM

## 2016-10-13 DIAGNOSIS — D72829 Elevated white blood cell count, unspecified: Secondary | ICD-10-CM

## 2016-10-13 DIAGNOSIS — I509 Heart failure, unspecified: Secondary | ICD-10-CM

## 2016-10-13 LAB — COMPREHENSIVE METABOLIC PANEL
ALBUMIN: 3.9 g/dL (ref 3.5–5.0)
ALK PHOS: 157 U/L — AB (ref 40–150)
ALT: 19 U/L (ref 0–55)
AST: 28 U/L (ref 5–34)
Anion Gap: 9 mEq/L (ref 3–11)
BUN: 28.6 mg/dL — ABNORMAL HIGH (ref 7.0–26.0)
CALCIUM: 9.3 mg/dL (ref 8.4–10.4)
CO2: 23 mEq/L (ref 22–29)
CREATININE: 1.4 mg/dL — AB (ref 0.6–1.1)
Chloride: 107 mEq/L (ref 98–109)
EGFR: 43 mL/min/{1.73_m2} — AB (ref >90)
Glucose: 104 mg/dl (ref 70–140)
Potassium: 4.6 mEq/L (ref 3.5–5.1)
Sodium: 139 mEq/L (ref 136–145)
TOTAL PROTEIN: 7.8 g/dL (ref 6.4–8.3)
Total Bilirubin: 0.38 mg/dL (ref 0.20–1.20)

## 2016-10-13 LAB — CBC WITH DIFFERENTIAL/PLATELET
BASO%: 0.1 % (ref 0.0–2.0)
Basophils Absolute: 0.2 10*3/uL — ABNORMAL HIGH (ref 0.0–0.1)
EOS ABS: 0.4 10*3/uL (ref 0.0–0.5)
EOS%: 0.3 % (ref 0.0–7.0)
HEMATOCRIT: 36.1 % (ref 34.8–46.6)
HGB: 11 g/dL — ABNORMAL LOW (ref 11.6–15.9)
LYMPH#: 152 10*3/uL — AB (ref 0.9–3.3)
LYMPH%: 93.7 % — ABNORMAL HIGH (ref 14.0–49.7)
MCH: 26.7 pg (ref 25.1–34.0)
MCHC: 30.6 g/dL — ABNORMAL LOW (ref 31.5–36.0)
MCV: 87.4 fL (ref 79.5–101.0)
MONO#: 2.7 10*3/uL — AB (ref 0.1–0.9)
MONO%: 1.7 % (ref 0.0–14.0)
NEUT%: 4.2 % — ABNORMAL LOW (ref 38.4–76.8)
NEUTROS ABS: 6.7 10*3/uL — AB (ref 1.5–6.5)
PLATELETS: 204 10*3/uL (ref 145–400)
RBC: 4.12 10*6/uL (ref 3.70–5.45)
RDW: 16.6 % — ABNORMAL HIGH (ref 11.2–14.5)
WBC: 162 10*3/uL — AB (ref 3.9–10.3)

## 2016-10-13 LAB — TECHNOLOGIST REVIEW

## 2016-10-13 LAB — LACTATE DEHYDROGENASE: LDH: 376 U/L — AB (ref 125–245)

## 2016-10-13 NOTE — Telephone Encounter (Signed)
Gave patient avs report and appointments for June.  °

## 2016-10-13 NOTE — Progress Notes (Signed)
Hematology and Oncology Follow Up Visit  Katherine Terrell [id number] 12/29/1960 55 y.o. 10/13/2016 3:26 PM [facility]*   Principle Diagnosis: 55 year old woman with stage 0 CLL diagnosed in 2011. Presented with lymphocytosis and mild splenomegaly.  Current therapy: Observation and surveillance.  Interim History: Katherine Terrell presents today for a followup visit accompanied by her family had an interpreter. Since her last visit, she was hospitalized for congestive heart failure. She has been discharged on the last few days and have been feeling better. Her appetite is improving although she has lost some weight. She is not reporting any constitutional symptoms at this time. She denied any fevers, chills .She denied any bulky adenopathy. She does report dyspnea on exertion although no dyspnea at rest. She does not require any oxygen at home.   She does not report any headaches, blurry vision, syncope or seizures. She does not report any chest pain, palpitation orthopnea. She does not report any cough or hemoptysis. He does not report any nausea, vomiting oral satiety. She does not report any constipation or diarrhea. She does not report any lymphadenopathy or petechiae. Rest of her review of systems unremarkable.  Medications: I have reviewed the patient's current medications.  Current Outpatient Prescriptions  Medication Sig Dispense Refill  . acetaminophen (TYLENOL) 500 MG tablet Take 500 mg by mouth every 6 (six) hours as needed.    Marland Kitchen aspirin EC 81 MG tablet Take 1 tablet (81 mg total) by mouth daily. 30 tablet 0  . carvedilol (COREG) 6.25 MG tablet Take 1 tablet (6.25 mg total) by mouth 2 (two) times daily with a meal. 60 tablet 11  . hydrALAZINE (APRESOLINE) 10 MG tablet Take 1 tablet (10 mg total) by mouth 3 (three) times daily. 90 tablet 1  . levothyroxine (SYNTHROID, LEVOTHROID) 50 MCG tablet Take 1 tablet (50 mcg total) by mouth daily before  breakfast. 30 tablet 0  . pantoprazole (PROTONIX) 20 MG tablet Take 1 tablet (20 mg total) by mouth daily. 30 tablet 0   No current facility-administered medications for this visit.      Allergies:  Allergies  Allergen Reactions  . Tramadol Nausea And Vomiting    Past Medical History, Surgical history, Social history, and Family History were reviewed and updated.   Physical Exam: Blood pressure 136/72, pulse 82, temperature 98.6 F (37 C), temperature source Oral, resp. rate 18, height 5\' 3"  (1.6 m), weight 174 lb 9.6 oz (79.2 kg), SpO2 100 %. ECOG: 0 General appearance: Alert, awake woman without distress. Chronically ill-appearing. Head: Normocephalic, without obvious abnormality no oral thrush noted. Neck: no adenopathy Lymph nodes: Cervical, supraclavicular, and axillary nodes normal. Heart:regular rate and rhythm, S1, S2 normal, no murmur, click, rub or gallop Lung:chest clear, no wheezing, rales, normal symmetric air entry Abdomin: soft, non-tender, without masses or organomegaly no rebound or guarding. Extremities: No edema noted.    Lab Results: Lab Results  Component Value Date   WBC 162.0 (HH) 10/13/2016   HGB 11.0 (L) 10/13/2016   HCT 36.1 10/13/2016   MCV 87.4 10/13/2016   PLT 204 10/13/2016     Chemistry      Component Value Date/Time   NA 136 10/11/2016 0928   NA 137 06/07/2016 1213   NA 141 02/09/2016 1506   K 5.2 (H) 10/11/2016 0928   K 3.8 02/09/2016 1506   CL 99 (L) 10/11/2016 0928   CL 107 12/24/2012 1332   CO2 27 10/11/2016 0928   CO2 27 02/09/2016 1506  BUN 26 (H) 10/11/2016 0928   BUN 31 (H) 06/07/2016 1213   BUN 17.7 02/09/2016 1506   CREATININE 1.39 (H) 10/11/2016 0928   CREATININE 1.3 (H) 02/09/2016 1506      Component Value Date/Time   CALCIUM 9.6 10/11/2016 0928   CALCIUM 9.0 02/09/2016 1506   ALKPHOS 106 10/08/2016 0258   ALKPHOS 112 02/09/2016 1506   AST 33 10/08/2016 0258   AST 23 02/09/2016 1506   ALT 17 10/08/2016 0258    ALT 11 02/09/2016 1506   BILITOT 0.7 10/08/2016 0258   BILITOT 0.32 02/09/2016 1506        Impression and Plan:  55 year old woman with the following issues:  1. Stage 0 CLL presented with a lymphocytosis. She has been diagnosed and followed since 2011 and have not developed any symptomatology that requires intervention.  Her last CT scan obtained in August 2016 showed very little enlargement of her lymph nodes.  Her laboratory data reviewed today and showed mild increase in her total white cell count. See no indication for treatment at this time unless he develops specific symptoms are related to CLL. This would include symptomatic lymphadenopathy, constitutional symptoms or cytopenias.  2. Congestive heart failure: Currently on Lasix and appears to be euvolemic. She continues to follow with cardiology regarding this issue.  3. Followup: Will be in 6 months.  [facility], MD 12/15/20173:26 PM

## 2016-10-26 ENCOUNTER — Ambulatory Visit: Payer: Self-pay | Admitting: Internal Medicine

## 2016-11-01 ENCOUNTER — Encounter (HOSPITAL_COMMUNITY): Payer: Self-pay | Admitting: Emergency Medicine

## 2016-11-01 ENCOUNTER — Ambulatory Visit: Payer: Self-pay | Admitting: Internal Medicine

## 2016-11-01 ENCOUNTER — Emergency Department (HOSPITAL_COMMUNITY): Payer: Self-pay

## 2016-11-01 ENCOUNTER — Inpatient Hospital Stay (HOSPITAL_COMMUNITY)
Admission: EM | Admit: 2016-11-01 | Discharge: 2016-11-04 | DRG: 292 | Disposition: A | Discharge status: Home / Self Care | Payer: Self-pay | Attending: Family Medicine | Admitting: Family Medicine

## 2016-11-01 DIAGNOSIS — Z8249 Family history of ischemic heart disease and other diseases of the circulatory system: Secondary | ICD-10-CM

## 2016-11-01 DIAGNOSIS — I959 Hypotension, unspecified: Secondary | ICD-10-CM | POA: Diagnosis present

## 2016-11-01 DIAGNOSIS — C9110 Chronic lymphocytic leukemia of B-cell type not having achieved remission: Secondary | ICD-10-CM

## 2016-11-01 DIAGNOSIS — E875 Hyperkalemia: Secondary | ICD-10-CM | POA: Diagnosis present

## 2016-11-01 DIAGNOSIS — D72829 Elevated white blood cell count, unspecified: Secondary | ICD-10-CM | POA: Diagnosis present

## 2016-11-01 DIAGNOSIS — E039 Hypothyroidism, unspecified: Secondary | ICD-10-CM

## 2016-11-01 DIAGNOSIS — I493 Ventricular premature depolarization: Secondary | ICD-10-CM

## 2016-11-01 DIAGNOSIS — I509 Heart failure, unspecified: Secondary | ICD-10-CM

## 2016-11-01 DIAGNOSIS — I472 Ventricular tachycardia: Secondary | ICD-10-CM | POA: Diagnosis present

## 2016-11-01 DIAGNOSIS — I5043 Acute on chronic combined systolic (congestive) and diastolic (congestive) heart failure: Secondary | ICD-10-CM

## 2016-11-01 DIAGNOSIS — D638 Anemia in other chronic diseases classified elsewhere: Secondary | ICD-10-CM | POA: Diagnosis present

## 2016-11-01 DIAGNOSIS — Z833 Family history of diabetes mellitus: Secondary | ICD-10-CM

## 2016-11-01 DIAGNOSIS — Z7982 Long term (current) use of aspirin: Secondary | ICD-10-CM

## 2016-11-01 DIAGNOSIS — R739 Hyperglycemia, unspecified: Secondary | ICD-10-CM | POA: Diagnosis present

## 2016-11-01 DIAGNOSIS — I248 Other forms of acute ischemic heart disease: Secondary | ICD-10-CM | POA: Diagnosis present

## 2016-11-01 DIAGNOSIS — I5040 Unspecified combined systolic (congestive) and diastolic (congestive) heart failure: Secondary | ICD-10-CM

## 2016-11-01 DIAGNOSIS — D649 Anemia, unspecified: Secondary | ICD-10-CM

## 2016-11-01 DIAGNOSIS — I5021 Acute systolic (congestive) heart failure: Secondary | ICD-10-CM

## 2016-11-01 DIAGNOSIS — I420 Dilated cardiomyopathy: Secondary | ICD-10-CM | POA: Diagnosis present

## 2016-11-01 DIAGNOSIS — I110 Hypertensive heart disease with heart failure: Principal | ICD-10-CM | POA: Diagnosis present

## 2016-11-01 DIAGNOSIS — E785 Hyperlipidemia, unspecified: Secondary | ICD-10-CM | POA: Diagnosis present

## 2016-11-01 DIAGNOSIS — Z79899 Other long term (current) drug therapy: Secondary | ICD-10-CM

## 2016-11-01 DIAGNOSIS — E876 Hypokalemia: Secondary | ICD-10-CM | POA: Diagnosis present

## 2016-11-01 DIAGNOSIS — I10 Essential (primary) hypertension: Secondary | ICD-10-CM

## 2016-11-01 DIAGNOSIS — Z95810 Presence of automatic (implantable) cardiac defibrillator: Secondary | ICD-10-CM

## 2016-11-01 LAB — BASIC METABOLIC PANEL
ANION GAP: 3 — AB (ref 5–15)
BUN: 17 mg/dL (ref 6–20)
CHLORIDE: 110 mmol/L (ref 101–111)
CO2: 26 mmol/L (ref 22–32)
Calcium: 8.6 mg/dL — ABNORMAL LOW (ref 8.9–10.3)
Creatinine, Ser: 1.12 mg/dL — ABNORMAL HIGH (ref 0.44–1.00)
GFR calc non Af Amer: 55 mL/min — ABNORMAL LOW (ref >60)
Glucose, Bld: 143 mg/dL — ABNORMAL HIGH (ref 65–99)
POTASSIUM: 4.8 mmol/L (ref 3.5–5.1)
SODIUM: 139 mmol/L (ref 135–145)

## 2016-11-01 LAB — CREATININE, SERUM
CREATININE: 1.14 mg/dL — AB (ref 0.44–1.00)
GFR calc Af Amer: >60 mL/min (ref >60)
GFR, EST NON AFRICAN AMERICAN: 54 mL/min — AB (ref >60)

## 2016-11-01 LAB — LIPID PANEL
CHOL/HDL RATIO: 5.9 ratio
CHOLESTEROL: 196 mg/dL (ref 0–200)
HDL: 33 mg/dL — AB (ref >40)
LDL CALC: 141 mg/dL — AB (ref 0–99)
TRIGLYCERIDES: 111 mg/dL (ref <150)
VLDL: 22 mg/dL (ref 0–40)

## 2016-11-01 LAB — I-STAT TROPONIN, ED: Troponin i, poc: 0 ng/mL (ref 0.00–0.08)

## 2016-11-01 LAB — TROPONIN I
TROPONIN I: 0.05 ng/mL — AB (ref <0.03)
Troponin I: 0.04 ng/mL (ref <0.03)
Troponin I: 0.05 ng/mL (ref <0.03)

## 2016-11-01 LAB — HEPATIC FUNCTION PANEL
ALBUMIN: 3.6 g/dL (ref 3.5–5.0)
ALT: 15 U/L (ref 14–54)
AST: 31 U/L (ref 15–41)
Alkaline Phosphatase: 103 U/L (ref 38–126)
Bilirubin, Direct: 0.1 mg/dL (ref 0.1–0.5)
Indirect Bilirubin: 0.3 mg/dL (ref 0.3–0.9)
TOTAL PROTEIN: 6.3 g/dL — AB (ref 6.5–8.1)
Total Bilirubin: 0.4 mg/dL (ref 0.3–1.2)

## 2016-11-01 LAB — MRSA PCR SCREENING: MRSA by PCR: NEGATIVE

## 2016-11-01 LAB — CBC
HCT: 30.6 % — ABNORMAL LOW (ref 36.0–46.0)
HEMATOCRIT: 30.3 % — AB (ref 36.0–46.0)
HEMOGLOBIN: 9 g/dL — AB (ref 12.0–15.0)
HEMOGLOBIN: 9.1 g/dL — AB (ref 12.0–15.0)
MCH: 26.8 pg (ref 26.0–34.0)
MCH: 26.8 pg (ref 26.0–34.0)
MCHC: 29.7 g/dL — ABNORMAL LOW (ref 30.0–36.0)
MCHC: 29.7 g/dL — AB (ref 30.0–36.0)
MCV: 90.2 fL (ref 78.0–100.0)
MCV: 90 fL (ref 78.0–100.0)
Platelets: 194 10*3/uL (ref 150–400)
Platelets: 199 10*3/uL (ref 150–400)
RBC: 3.36 MIL/uL — AB (ref 3.87–5.11)
RBC: 3.4 MIL/uL — ABNORMAL LOW (ref 3.87–5.11)
RDW: 16.1 % — ABNORMAL HIGH (ref 11.5–15.5)
RDW: 16.2 % — AB (ref 11.5–15.5)
WBC: 142.3 10*3/uL — AB (ref 4.0–10.5)
WBC: 148.3 10*3/uL (ref 4.0–10.5)

## 2016-11-01 LAB — LIPASE, BLOOD: LIPASE: 19 U/L (ref 11–51)

## 2016-11-01 LAB — MAGNESIUM: Magnesium: 1.9 mg/dL (ref 1.7–2.4)

## 2016-11-01 LAB — BRAIN NATRIURETIC PEPTIDE: B NATRIURETIC PEPTIDE 5: 1517.2 pg/mL — AB (ref 0.0–100.0)

## 2016-11-01 MED ORDER — LEVOTHYROXINE SODIUM 50 MCG PO TABS
50.0000 ug | ORAL_TABLET | Freq: Every day | ORAL | Status: DC
  Administered 2016-11-02 – 2016-11-04 (×3): 50 ug via ORAL
  Filled 2016-11-01 (×3): qty 1

## 2016-11-01 MED ORDER — ASPIRIN EC 81 MG PO TBEC
81.0000 mg | DELAYED_RELEASE_TABLET | Freq: Every day | ORAL | Status: DC
  Administered 2016-11-01 – 2016-11-04 (×4): 81 mg via ORAL
  Filled 2016-11-01 (×4): qty 1

## 2016-11-01 MED ORDER — ONDANSETRON HCL 4 MG/2ML IJ SOLN: 4.0000 mg | Freq: Four times a day (QID) | INTRAMUSCULAR | Status: DC | PRN

## 2016-11-01 MED ORDER — ENOXAPARIN SODIUM 40 MG/0.4ML ~~LOC~~ SOLN
40.0000 mg | SUBCUTANEOUS | Status: DC
  Administered 2016-11-01 – 2016-11-03 (×2): 40 mg via SUBCUTANEOUS
  Filled 2016-11-01 (×3): qty 0.4

## 2016-11-01 MED ORDER — ONDANSETRON HCL 4 MG PO TABS: 4.0000 mg | ORAL_TABLET | Freq: Four times a day (QID) | ORAL | Status: DC | PRN

## 2016-11-01 MED ORDER — SODIUM CHLORIDE 0.9% FLUSH
3.0000 mL | Freq: Two times a day (BID) | INTRAVENOUS | Status: DC
  Administered 2016-11-02 – 2016-11-03 (×2): 3 mL via INTRAVENOUS

## 2016-11-01 MED ORDER — HYDRALAZINE HCL 10 MG PO TABS
10.0000 mg | ORAL_TABLET | Freq: Three times a day (TID) | ORAL | Status: DC
  Administered 2016-11-01 – 2016-11-03 (×7): 10 mg via ORAL
  Filled 2016-11-01 (×7): qty 1

## 2016-11-01 MED ORDER — FUROSEMIDE 10 MG/ML IJ SOLN
40.0000 mg | Freq: Once | INTRAMUSCULAR | Status: AC
  Administered 2016-11-01: 40 mg via INTRAVENOUS
  Filled 2016-11-01: qty 4

## 2016-11-01 MED ORDER — ACETAMINOPHEN 325 MG PO TABS
650.0000 mg | ORAL_TABLET | Freq: Four times a day (QID) | ORAL | Status: DC | PRN
  Administered 2016-11-01 – 2016-11-04 (×2): 650 mg via ORAL
  Filled 2016-11-01 (×2): qty 2

## 2016-11-01 MED ORDER — SODIUM CHLORIDE 0.9% FLUSH
3.0000 mL | Freq: Two times a day (BID) | INTRAVENOUS | Status: DC
  Administered 2016-11-01 – 2016-11-03 (×3): 3 mL via INTRAVENOUS

## 2016-11-01 MED ORDER — SODIUM CHLORIDE 0.9% FLUSH: 3.0000 mL | INTRAVENOUS | Status: DC | PRN

## 2016-11-01 MED ORDER — FUROSEMIDE 10 MG/ML IJ SOLN
40.0000 mg | Freq: Every day | INTRAMUSCULAR | Status: DC
  Administered 2016-11-02: 40 mg via INTRAVENOUS
  Filled 2016-11-01 (×2): qty 4

## 2016-11-01 MED ORDER — SODIUM CHLORIDE 0.9 % IV SOLN: 250.0000 mL | INTRAVENOUS | Status: DC | PRN

## 2016-11-01 MED ORDER — CARVEDILOL 6.25 MG PO TABS
6.2500 mg | ORAL_TABLET | Freq: Two times a day (BID) | ORAL | Status: DC
  Administered 2016-11-01 – 2016-11-04 (×5): 6.25 mg via ORAL
  Filled 2016-11-01 (×6): qty 1

## 2016-11-01 MED ORDER — ACETAMINOPHEN 650 MG RE SUPP: 650.0000 mg | Freq: Four times a day (QID) | RECTAL | Status: DC | PRN

## 2016-11-01 NOTE — ED Provider Notes (Signed)
[facility] DEPT Provider Note   CSN: [id number] Arrival date & time: 11/01/16  [id number]     History   Chief Complaint Chief Complaint  Patient presents with  . Shortness of Breath    Chest Tightness    HPI Katherine Terrell is a 56 y.o. female.  HPI   Shortness of breath, has been gradually worsening since last hospitalization in December, associated chest tightness, one month of productive cough of clear sputum, has epigastric pain, generalized weakness.  Shortness of breath and Orthopnea worsening over last few days.  Weight increasing. Generalized weakness and fatigue. Dyspnea on exertion worsening. Cath and echo in 12/2015, no CAD, EF has worsened to 25% at last visit.  Not taking lasix at home.   Past Medical History:  Diagnosis Date  . Cardiomyopathy    echo: (11/11) EF 25-30% with diffuse hypokinesis, mild left atrial enlargement. 11/11 HIV and ANA negative. TSH normal. LHC (1/12): EF 50-55% no angiographic CAD. Echo (2/12): EF 55-60% moderate LV hypertrophy, grade I diastolic dysfunction, mild MR, normal RV  . Chronic lymphocytic leukemia ([place])    followed by Dr. Ralene Ok  . Hyperlipidemia   . Hypertension   . Leukemia ([place])   . Multinodular goiter    with hypothyroidism    Patient Active Problem List   Diagnosis Date Noted  . Congestive heart failure (CHF) ([place]) 11/01/2016  . CHF exacerbation ([place]) 11/01/2016  . Normocytic anemia   . Epigastric pain   . Acute on chronic combined systolic and diastolic congestive heart failure ([place])   . Shortness of breath   . DCM (dilated cardiomyopathy) ([place])   . Hyperkalemia 10/05/2016  . Hypoxia   . Acute systolic CHF (congestive heart failure) ([place]) 11/30/2015  . Sepsis ([place]) 11/28/2015  . CAP (community acquired pneumonia) 11/27/2015  . Ventricular tachycardia, non-sustained ([place]) 11/27/2015  . Hyperlipidemia 11/27/2015  . Essential hypertension 11/27/2015  . Influenza B 11/27/2015  . Dehydration 11/27/2015  . Chronic  diastolic heart failure ([place]) 11/27/2015  . CLL (chronic lymphocytic leukemia) ([place]) 12/04/2011  . Hypothyroidism 12/23/2010  . CHRONIC SYSTOLIC HEART FAILURE [id number]    Past Surgical History:  Procedure Laterality Date  . CARDIAC CATHETERIZATION  2012   @ [place]  . CARDIAC CATHETERIZATION N/A 12/13/2015   Procedure: Left Heart Cath and Coronary Angiography;  Surgeon: Larey Dresser, MD;  Location: [facility] LAB;  Service: Cardiovascular;  Laterality: N/A;    OB History    Gravida Para Term Preterm AB Living   3 3 3     3    SAB TAB Ectopic Multiple Live Births                   Home Medications    Prior to Admission medications   Medication Sig Start Date End Date Taking? Authorizing Provider  acetaminophen (TYLENOL) 500 MG tablet Take 500 mg by mouth every 6 (six) hours as needed.   Yes Historical Provider, MD  aspirin EC 81 MG tablet Take 1 tablet (81 mg total) by mouth daily. 06/04/16  Yes Kelvin Cellar, MD  carvedilol (COREG) 6.25 MG tablet Take 1 tablet (6.25 mg total) by mouth 2 (two) times daily with a meal. 07/14/16  Yes Wellington Hampshire, MD  hydrALAZINE (APRESOLINE) 10 MG tablet Take 1 tablet (10 mg total) by mouth 3 (three) times daily. 10/11/16  Yes Sela Hilding, MD  levothyroxine (SYNTHROID, LEVOTHROID) 50 MCG tablet Take 1 tablet (50 mcg total) by mouth daily before breakfast. 02/04/16  Yes Larey Dresser, MD  pantoprazole (PROTONIX) 20 MG tablet Take 1 tablet (20 mg total) by mouth daily. Patient taking differently: Take 20 mg by mouth daily as needed for heartburn.  10/12/16  Yes Sela Hilding, MD    Family History Family History  Problem Relation Age of Onset  . Heart attack Mother 19  . Diabetes Sister     Social History Social History  Substance Use Topics  . Smoking status: Never Smoker  . Smokeless tobacco: Never Used  . Alcohol use No     Allergies   Tramadol   Review of Systems Review of Systems  Constitutional:  Positive for fatigue. Negative for fever.  HENT: Negative for sore throat.   Eyes: Negative for visual disturbance.  Respiratory: Positive for cough and shortness of breath.   Cardiovascular: Positive for chest pain and leg swelling.  Gastrointestinal: Negative for abdominal pain, nausea and vomiting.  Genitourinary: Negative for difficulty urinating.  Musculoskeletal: Negative for back pain and neck pain.  Skin: Negative for rash.  Neurological: Negative for syncope and headaches.     Physical Exam Updated Vital Signs BP 118/66 (BP Location: Right Arm)   Pulse 81   Temp 97.7 F (36.5 C) (Oral)   Resp 17   Ht 5\' 3"  (1.6 m)   Wt 173 lb 12.8 oz (78.8 kg)   SpO2 100%   BMI 30.79 kg/m   Physical Exam  Constitutional: She is oriented to person, place, and time. She appears well-developed and well-nourished. No distress.  HENT:  Head: Normocephalic and atraumatic.  Eyes: Conjunctivae and EOM are normal.  Neck: Normal range of motion. JVD present.  Cardiovascular: Normal rate, regular rhythm, normal heart sounds and intact distal pulses.  Exam reveals no gallop and no friction rub.   No murmur heard. Pulmonary/Chest: Effort normal and breath sounds normal. No respiratory distress. She has no wheezes. She has no rales.  Abdominal: Soft. She exhibits no distension. There is no tenderness. There is no guarding.  Musculoskeletal: She exhibits no edema or tenderness.  Neurological: She is alert and oriented to person, place, and time.  Skin: Skin is warm and dry. No rash noted. She is not diaphoretic. No erythema.  Nursing note and vitals reviewed.    ED Treatments / Results  Labs (all labs ordered are listed, but only abnormal results are displayed) Labs Reviewed  BASIC METABOLIC PANEL - Abnormal; Notable for the following:       Result Value   Glucose, Bld 143 (*)    Creatinine, Ser 1.12 (*)    Calcium 8.6 (*)    GFR calc non Af Amer 55 (*)    Anion gap 3 (*)    All other  components within normal limits  CBC - Abnormal; Notable for the following:    WBC 142.3 (*)    RBC 3.36 (*)    Hemoglobin 9.0 (*)    HCT 30.3 (*)    MCHC 29.7 (*)    RDW 16.1 (*)    All other components within normal limits  BRAIN NATRIURETIC PEPTIDE - Abnormal; Notable for the following:    B Natriuretic Peptide 1,517.2 (*)    All other components within normal limits  HEPATIC FUNCTION PANEL - Abnormal; Notable for the following:    Total Protein 6.3 (*)    All other components within normal limits  TROPONIN I - Abnormal; Notable for the following:    Troponin I 0.04 (*)    All other components  within normal limits  TROPONIN I - Abnormal; Notable for the following:    Troponin I 0.05 (*)    All other components within normal limits  LIPID PANEL - Abnormal; Notable for the following:    HDL 33 (*)    LDL Cholesterol 141 (*)    All other components within normal limits  CBC - Abnormal; Notable for the following:    WBC 148.3 (*)    RBC 3.40 (*)    Hemoglobin 9.1 (*)    HCT 30.6 (*)    MCHC 29.7 (*)    RDW 16.2 (*)    All other components within normal limits  CREATININE, SERUM - Abnormal; Notable for the following:    Creatinine, Ser 1.14 (*)    GFR calc non Af Amer 54 (*)    All other components within normal limits  TROPONIN I - Abnormal; Notable for the following:    Troponin I 0.05 (*)    All other components within normal limits  MRSA PCR SCREENING  LIPASE, BLOOD  MAGNESIUM  HEMOGLOBIN 123XX123  BASIC METABOLIC PANEL  CBC  I-STAT TROPOININ, ED    EKG  EKG Interpretation  Date/Time:  Wednesday November 01 2016 06:21:11 EST Ventricular Rate:  93 PR Interval:  208 QRS Duration: 98 QT Interval:  366 QTC Calculation: 455 R Axis:   29 Text Interpretation:  Sinus rhythm with frequent Premature ventricular complexes Possible Left atrial enlargement ST & T wave abnormality, consider lateral ischemia Abnormal ECG No significant change since last tracing Confirmed by  [facility] MD, Alison Kubicki ([id number]) on 11/01/2016 8:19:43 AM       Radiology Dg Chest 2 View  Result Date: 11/01/2016 CLINICAL DATA:  Shortness of breath, chest tightness. History of CHF, cardiomyopathy. EXAM: CHEST  2 VIEW COMPARISON:  PA and lateral chest x-ray of October 05, 2016 FINDINGS: The lungs are adequately inflated. The interstitial markings are chronically increased. There is confluent density in the right infrahilar region likely in the middle lobe. There is no significant pleural effusion. The cardiac silhouette remains enlarged. The pulmonary vascularity is mildly prominent centrally. The trachea is midline. The bony thorax exhibits no acute abnormality. IMPRESSION: Slightly increased prominence of the pulmonary interstitium and pulmonary vascularity consistent low-grade acute CHF superimposed on chronic CHF. Atelectasis anteriorly in the right middle lobe is suspected. Electronically Signed   By: David  Martinique M.D.   On: 11/01/2016 07:25    Procedures Procedures (including critical care time)  Medications Ordered in ED Medications  hydrALAZINE (APRESOLINE) tablet 10 mg (10 mg Oral Given 11/01/16 2238)  carvedilol (COREG) tablet 6.25 mg (6.25 mg Oral Given 11/01/16 1707)  aspirin EC tablet 81 mg (81 mg Oral Given 11/01/16 1539)  levothyroxine (SYNTHROID, LEVOTHROID) tablet 50 mcg (not administered)  enoxaparin (LOVENOX) injection 40 mg (40 mg Subcutaneous Given 11/01/16 1541)  sodium chloride flush (NS) 0.9 % injection 3 mL (0 mLs Intravenous Duplicate [id number])  sodium chloride flush (NS) 0.9 % injection 3 mL (3 mLs Intravenous Given 11/01/16 2245)  sodium chloride flush (NS) 0.9 % injection 3 mL (not administered)  0.9 %  sodium chloride infusion (not administered)  acetaminophen (TYLENOL) tablet 650 mg (650 mg Oral Given 11/01/16 2244)    Or  acetaminophen (TYLENOL) suppository 650 mg ( Rectal See Alternative 11/01/16 2244)  ondansetron (ZOFRAN) tablet 4 mg (not administered)    Or    ondansetron (ZOFRAN) injection 4 mg (not administered)  furosemide (LASIX) injection 40 mg (not administered)  furosemide (LASIX)  injection 40 mg (40 mg Intravenous Given 11/01/16 0957)     Initial Impression / Assessment and Plan / ED Course  I have reviewed the triage vital signs and the nursing notes.  Pertinent labs & imaging results that were available during my care of the patient were reviewed by me and considered in my medical decision making (see chart for details).  Clinical Course    56yo female with history of htn, hlpd, CLL, combined systolicdiastolic CHF presents with concern for worsening dyspnea and CP. Concern for JVD, elevated BNP, worsening edema on CXR consistent with acute on chronic CHF. Ordered lasix. Pt with CLL, doubt leukostasis.  Do not feel CT PE indicated at this time given more likely CHF. Initial troponin negative. Will admit for diureses, continued care, CP obs.  Final Clinical Impressions(s) / ED Diagnoses   Final diagnoses:  Acute systolic congestive heart failure [facility])    New Prescriptions Current Discharge Medication List       Gareth Morgan, MD 11/02/16 0021

## 2016-11-01 NOTE — ED Notes (Signed)
Troponin 0.04 called from lab, admitting paged to inform

## 2016-11-01 NOTE — ED Notes (Signed)
RN called lab to add on Hepatic function and lipase.

## 2016-11-01 NOTE — ED Notes (Signed)
Ordered heart healthy lunch tray per diet order

## 2016-11-01 NOTE — ED Notes (Signed)
Called pharmacy to check on medication delay

## 2016-11-01 NOTE — Progress Notes (Signed)
I got a page about troponin result. It went up to 0.04. We will continue to trend. In the mean time, continue ASA. As discussed with resident previous, plan to get cardiologist involved given intermittent Vtach and irregular heart rhythm on exam. Patient is already on betablocker, we will continue that as well. Obtain risk stratification lab. Monitor for now.

## 2016-11-01 NOTE — ED Triage Notes (Signed)
Patient reports chest tightness with SOB and occasional productive cough onset last night , denies fever or chills.

## 2016-11-01 NOTE — H&P (Signed)
[facility] Admission History and Physical Service Pager: [phone]  Patient name: Katherine Terrell Medical record number: [id number] Date of birth: 1960/12/17 Age: 56 y.o. Gender: female  Primary Care Provider: [facility] Consultants: None Code Status: FULL  Chief Complaint: Chest pain and shortness of breath  Assessment and Plan: Katherine Terrell is a 56 y.o. female presenting with chest pain and shortness of breath. 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  Chest Pain/SOB : Likely 2/2 acute exacerbation of chronic combined HF. Last ECHO 10/07/16 with EF 25-30%, G3DD, PA peak pressure 33mmHg. Pt with gradually worsening shortness of breath. She endorses increased salt intake over the holidays. She has been having new 3 pillow orthopnea and dyspnea on exertion. Also notes increased lower extremity edema. Exam notable for crackles in her bases R > L. BNP 1517. CXR consistent with acute CHF. Was previously taking Lasix 20mg  daily, but this was stopped after last admission. Weight is 174lb, up from 171lb at last admission. ACS is also a consideration, given her left sided chest pain. The pain is sometimes worse with walking, but is also worse with laying flat, coughing, and taking deep breaths. Initial trop 0.00. EKG with T wave inversions in V5-V6. PE is also on the differential, given her active CLL. Think this is less likely, as her chest pain and shortness of breath were gradual in onset instead of occurring suddenly. No signs of DVT on exam. No recent immobilization. Wells score is a 1 for active cancer. Pericarditis is also a consideration, given that Pt states her chest pain is worse with laying flat and better with sitting up. However, no rub noted on exam and EKG does not show any diffuse ST elevations. Pneumonia is also a consideration, given her productive cough, however she has not had any fevers or  chills and no focal findings were seen on CXR. Pt is a never smoker, so think COPD is unlikely. - Admit to stepdown under inpatient status, attending Dr. Gwendlyn Deutscher. - s/p Lasix 40mg  IV x 1 in the ED. Continue Lasix 40mg  IV daily. - Daily weights, strict I/Os - Trend troponins - Repeat AM EKG - Repeat AM CBC and BMP - Continue home Coreg 6.25mg  bid and Aspirin 81mg  daily. Pt previously on ACE and spironolactone, but those were held per cards for hyperkalemia to 6.1 at last admission. - Pt scheduled to see Cardiology on 1/9 for evaluation of ICD placement. - If becomes febrile, consider repeating imaging and starting an antibiotic for pneumonia - Tylenol prn for pain, Zofran for nausea - Cardiac monitoring - Vitals per unit routine  Recurrent V-tach: Pt noted to have recurrent v-tach on telemetry, with rates ranging between 100-120. - Cardiology consult.  CLL, active: Diagnosed in 2011. Followed by Dr. Ralene Ok. WBC 142.3. - Repeat AM CBC  Normocytic Anemia: Likely anemia of chronic disease. Hgb 9.0. Baseline 8-9. - Monitor with daily CBCs  HTN: BP up to 161-95 in the ED - Continue home Coreg 6.25mg  bid and Hydralazine 10mg  tid  Hypothyroidism: Last TSH 10/05/16 3.882. - Continue home Synthroid 45mcg daily  HLD: Last lipid panel 11/29/2015 with Chl 157, HDL 30, LDL 104, TG 116. ASCVD risk score 5.4%. - Will check a lipid panel  Hyperglycemia: Glucose 143 on BMP. - Will check a Hemoglobin A1c  FEN/GI: Heart healthy diet Prophylaxis: Lovenox  Disposition: Admit to stepdown, attending Dr. Gwendlyn Deutscher. Anticipate discharge home in 3-4 days, pending adequate diuresis.  History of Present Illness:  Katherine Terrell is a 56 y.o. female presenting with chest pain and shortness of breath that worsened last night. She states she has had chest pain on and off since March. The chest pain is located on the left side of her chest. The pain is a "pinching" pain. The pain does not radiate. The pain is worse  with laying flat, worse with coughing, worse with taking a deep breath, and sometimes worse with walking. The chest pain is better with sitting up. Her chest pain became slightly worse last night and was associated with shortness of the breath. The chest pain and shortness of breath were gradual in onset. She endorses three pillow orthopnea for the last two weeks. She also endorses dyspnea on exertion and PND. She states she has been eating more salt over the holidays. She also endorses cough productive of white sputum that has been going on over the last couple of weeks. She denies fevers or chills. She endorses nausea, but no vomiting. She has had lower extremity edema for the last few months.  In the ED, O2 saturations were 98-100% on room air. She was tachypneic to the 20s-30s. She was afebrile and hypertensive to 161/95. Labs were significant for Cr 1.12, WBC 142.3, Hgb 9.0, BNP 1517, trop 0.00. EKG with T wave inversions in V5 and V6. CXR with slightly increased pulmonary vascularity consistent with acute CHF. She received Lasix 40mg  IV x 1. While in the ED, she was noted to have recurrent episodes of v-tach.  Review Of Systems: Per HPI with the following additions: see below  Review of Systems  Constitutional: Positive for malaise/fatigue. Negative for chills and fever.  HENT: Negative for congestion and sinus pain.   Eyes: Negative for blurred vision.  Respiratory: Positive for cough, sputum production and shortness of breath.   Cardiovascular: Positive for chest pain, orthopnea, leg swelling and PND. Negative for palpitations.  Gastrointestinal: Positive for nausea. Negative for abdominal pain, diarrhea and vomiting.  Genitourinary: Negative for dysuria and frequency.  Musculoskeletal: Negative for myalgias.  Skin: Negative for itching and rash.  Neurological: Positive for dizziness. Negative for headaches.  Psychiatric/Behavioral: Negative for depression. The patient is not  nervous/anxious.     Patient Active Problem List   Diagnosis Date Noted  . Congestive heart failure (CHF) ([place]) 11/01/2016  . Epigastric pain   . Acute on chronic combined systolic and diastolic CHF (congestive heart failure) ([place])   . Shortness of breath   . DCM (dilated cardiomyopathy) ([place])   . Hyperkalemia 10/05/2016  . Hypoxia   . Acute systolic CHF (congestive heart failure) ([place]) 11/30/2015  . Sepsis ([place]) 11/28/2015  . CAP (community acquired pneumonia) 11/27/2015  . Ventricular tachycardia, non-sustained ([place]) 11/27/2015  . Hyperlipidemia 11/27/2015  . Essential hypertension 11/27/2015  . Influenza B 11/27/2015  . Dehydration 11/27/2015  . Chronic diastolic heart failure ([place]) 11/27/2015  . CLL (chronic lymphocytic leukemia) ([place]) 12/04/2011  . Hypothyroidism 12/23/2010  . CHRONIC SYSTOLIC HEART FAILURE 123456    Past Medical History: Past Medical History:  Diagnosis Date  . Cardiomyopathy    echo: (11/11) EF 25-30% with diffuse hypokinesis, mild left atrial enlargement. 11/11 HIV and ANA negative. TSH normal. LHC (1/12): EF 50-55% no angiographic CAD. Echo (2/12): EF 55-60% moderate LV hypertrophy, grade I diastolic dysfunction, mild MR, normal RV  . Chronic lymphocytic leukemia ([place])    followed by Dr. Ralene Ok  . Hyperlipidemia   . Hypertension   . Leukemia ([place])   .  Multinodular goiter    with hypothyroidism    Past Surgical History: Past Surgical History:  Procedure Laterality Date  . CARDIAC CATHETERIZATION  2012   @ [place]  . CARDIAC CATHETERIZATION N/A 12/13/2015   Procedure: Left Heart Cath and Coronary Angiography;  Surgeon: Larey Dresser, MD;  Location: [place] [facility];  Service: Cardiovascular;  Laterality: N/A;    Social History: Social History  Substance Use Topics  . Smoking status: Never Smoker  . Smokeless tobacco: Never Used  . Alcohol use No   Additional social history: None Please also refer to relevant sections of  EMR.  Family History: Family History  Problem Relation Age of Onset  . Heart attack Mother 21  . Diabetes Sister     Allergies and Medications: Allergies  Allergen Reactions  . Tramadol Nausea And Vomiting   No current facility-administered medications on file prior to encounter.    Current Outpatient Prescriptions on File Prior to Encounter  Medication Sig Dispense Refill  . acetaminophen (TYLENOL) 500 MG tablet Take 500 mg by mouth every 6 (six) hours as needed.    Marland Kitchen aspirin EC 81 MG tablet Take 1 tablet (81 mg total) by mouth daily. 30 tablet 0  . carvedilol (COREG) 6.25 MG tablet Take 1 tablet (6.25 mg total) by mouth 2 (two) times daily with a meal. 60 tablet 11  . hydrALAZINE (APRESOLINE) 10 MG tablet Take 1 tablet (10 mg total) by mouth 3 (three) times daily. 90 tablet 1  . levothyroxine (SYNTHROID, LEVOTHROID) 50 MCG tablet Take 1 tablet (50 mcg total) by mouth daily before breakfast. 30 tablet 0  . pantoprazole (PROTONIX) 20 MG tablet Take 1 tablet (20 mg total) by mouth daily. 30 tablet 0    Objective: BP 161/95   Pulse 95   Temp 97.5 F (36.4 C) (Oral)   Resp (!) 38   SpO2 95%  Exam: General: Tired-appearing female, appears older than stated age, in NAD Eyes: PERRLA, no scleral icterus, EOMI ENTM: Nose normal, oropharynx clear Neck: Supple, no thyromegaly, no cervical lymphadenopathy, full ROM Cardiovascular: Seemingly regular with frequent irregular beats, regular rate, 2+ DP pulses bilaterally Respiratory: Mildly tachypneic with RR in the mid 20s, normal work of breath, able to complete full sentences, good air movement throughout, bibasilar crackles R > L Gastrointestinal: +BS, soft, non-tender, non-distended MSK: Mild symmetric non-pitting edema, warm and well-perfused, normal tone Derm: No rashes, no lesions Neuro: Awake, alert, oriented x 3, CN 2-12 grossly intact, gait normal Psych: Appropriate affect, normal behavior  Labs and Imaging: CBC BMET    Recent Labs Lab 11/01/16 0627  WBC 142.3*  HGB 9.0*  HCT 30.3*  PLT 194    Recent Labs Lab 11/01/16 0627  NA 139  K 4.8  CL 110  CO2 26  BUN 17  CREATININE 1.12*  GLUCOSE 143*  CALCIUM 8.6*     BNP 1517 Trop 0.00 EKG: T wave inversions in V5 and V6 CXR: slightly increased pulmonary vascularity consistent with acute CHF  Sela Hua, MD 11/01/2016, 10:04 AM PGY-2, [place] [facility] pager: [phone], text pages welcome

## 2016-11-02 DIAGNOSIS — I5023 Acute on chronic systolic (congestive) heart failure: Secondary | ICD-10-CM

## 2016-11-02 LAB — CBC
HCT: 29.8 % — ABNORMAL LOW (ref 36.0–46.0)
Hemoglobin: 8.7 g/dL — ABNORMAL LOW (ref 12.0–15.0)
MCH: 26.2 pg (ref 26.0–34.0)
MCHC: 29.2 g/dL — ABNORMAL LOW (ref 30.0–36.0)
MCV: 89.8 fL (ref 78.0–100.0)
PLATELETS: 181 10*3/uL (ref 150–400)
RBC: 3.32 MIL/uL — AB (ref 3.87–5.11)
RDW: 16.2 % — AB (ref 11.5–15.5)
WBC: 145 10*3/uL — AB (ref 4.0–10.5)

## 2016-11-02 LAB — BASIC METABOLIC PANEL
Anion gap: 6 (ref 5–15)
BUN: 16 mg/dL (ref 6–20)
CALCIUM: 8.8 mg/dL — AB (ref 8.9–10.3)
CHLORIDE: 104 mmol/L (ref 101–111)
CO2: 28 mmol/L (ref 22–32)
Creatinine, Ser: 1.28 mg/dL — ABNORMAL HIGH (ref 0.44–1.00)
GFR calc non Af Amer: 47 mL/min — ABNORMAL LOW (ref >60)
GFR, EST AFRICAN AMERICAN: 54 mL/min — AB (ref >60)
Glucose, Bld: 146 mg/dL — ABNORMAL HIGH (ref 65–99)
Potassium: 4.5 mmol/L (ref 3.5–5.1)
SODIUM: 138 mmol/L (ref 135–145)

## 2016-11-02 LAB — HEMOGLOBIN A1C
HEMOGLOBIN A1C: 5.6 % (ref 4.8–5.6)
Mean Plasma Glucose: 114 mg/dL

## 2016-11-02 MED ORDER — FUROSEMIDE 10 MG/ML IJ SOLN
40.0000 mg | Freq: Every day | INTRAMUSCULAR | Status: DC
  Administered 2016-11-03: 40 mg via INTRAVENOUS
  Filled 2016-11-02: qty 4

## 2016-11-02 NOTE — Discharge Summary (Signed)
[facility] Discharge Summary  Patient name: Katherine Terrell Medical record number: [id number] Date of birth: 1961/08/14 Age: 56 y.o. Gender: female Date of Admission: 11/01/2016  Date of Discharge: 11/04/16 Admitting Physician: Kinnie Feil, MD  Primary Care Provider: [facility] Consultants: Cardiology  Indication for Hospitalization: Chest pain and shortness of breath.  Discharge Diagnoses/Problem List:  - Combined systolic and diastolic heart failure - Chronic lymphocytic leukemia - HTN - HLD - Hypothyroidism - Normocytic anemia  Disposition: Discharge to home  Discharge Condition: Stable  Discharge Exam:  General: Tired-appearing but non-toxic, laying in bed in NAD  Cardiovascular: RRR. No murmurs. No JVD.  Respiratory: Normal work of breathing. No wheezes, crackles or rhonchi.  Abdomen: Soft, non-tender and non-distended. Extremities: No lower extremity edema.  Brief Hospital Course:  Katherine Terrell is a 56 y/o female with PMH significant for combined HF, CLL, hypothyroidism, HLD and HTN who presented on 11/01/16 for chest pain and shortness of breath that worsened with laying down and gradual in onset in association with bibasilar crackles, tachypnea and frequent irregular beats. The initial evaluation of her symptoms included consideration of most likely CHF exacerbation vs less likely PE vs ACS vs pericarditis vs pneumonia. CHF was considered the most likely cause of her symptoms given that she was up 3 pounds from her last admission, her BNP was 1517, CXR showed pulmonary edema and there were crackles on her lung exam. PE was considered because of her active diagnosis of cancer but she had no unilateral swelling of the lower extremities or Hohmann sign. ACS was ruled out with serial troponins and lack of ST-segment changes on her EKG and because there were no ST-segment changes on the EKG we also thought that pericarditis was  less likely as well. Even though her WBC was elevated, however this was at her baseline upon further review and even though she had a cough that was productive the CXR showed no lobar infiltrates.   During her hospitalization, Pt diuresed well. Her weight decreased from 174lb on admission to 169lb. Her shortness of breath improved. She was discharged home on Lasix 40mg  PO daily.  On the day of admission, Pt was noted to have intermittent ventricular tachycardia. She was continued on carvedilol 6.25 mg PO BID. We discussed the case with Dr. Lovena Le (EP) over the phone and he felt that was appropriate for outpatient follow-up. She already has an appointment with Dr. Lovena Le scheduled on 1/9.  Issues for Follow Up:  1. Pt was restarted on Lisinopril 5mg  daily this admission. She was noted to have soft blood pressures, so we discontinued her Hydralazine and continued her on the Lisinopril and Coreg for her heart failure. Please follow-up with her blood pressures as an outpatient. 2. Pt discharged with new prescriptions for Lisinopril and Lasix. Recommend rechecking BMP at follow-up. 3. Per cardiology, there was some concern that her elevated WBC is causing some hyperviscosity that could be causing a worsening of her heart failure. She should continue to follow with oncology for this.  Significant Procedures: None  Significant Labs and Imaging:   Recent Labs Lab 11/01/16 0627 11/01/16 1325 11/02/16 0326  WBC 142.3* 148.3* 145.0*  HGB 9.0* 9.1* 8.7*  HCT 30.3* 30.6* 29.8*  PLT 194 199 181    Recent Labs Lab 11/01/16 0627 11/01/16 1325 11/02/16 0326 11/03/16 0439 11/04/16 0410  NA 139  --  138 139 140  K 4.8  --  4.5 3.2* 4.3  CL 110  --  104 102 105  CO2 26  --  28 29 27   GLUCOSE 143*  --  146* 108* 101*  BUN 17  --  16 13 19   CREATININE 1.12* 1.14* 1.28* 1.19* 1.16*  CALCIUM 8.6*  --  8.8* 8.8* 9.0  MG  --  1.9  --   --   --   ALKPHOS 103  --   --   --   --   AST 31  --   --   --    --   ALT 15  --   --   --   --   ALBUMIN 3.6  --   --   --   --     Results/Tests Pending at Time of Discharge: None  Discharge Medications:  Allergies as of 11/04/2016      Reactions   Tramadol Nausea And Vomiting      Medication List    STOP taking these medications   hydrALAZINE 10 MG tablet Commonly known as:  APRESOLINE     TAKE these medications   acetaminophen 500 MG tablet Commonly known as:  TYLENOL Take 500 mg by mouth every 6 (six) hours as needed.   aspirin EC 81 MG tablet Take 1 tablet (81 mg total) by mouth daily.   carvedilol 6.25 MG tablet Commonly known as:  COREG Take 1 tablet (6.25 mg total) by mouth 2 (two) times daily with a meal.   furosemide 40 MG tablet Commonly known as:  LASIX Take 1 tablet (40 mg total) by mouth daily. Start taking on:  11/05/2016   levothyroxine 50 MCG tablet Commonly known as:  SYNTHROID, LEVOTHROID Take 1 tablet (50 mcg total) by mouth daily before breakfast.   lisinopril 5 MG tablet Commonly known as:  PRINIVIL,ZESTRIL Take 1 tablet (5 mg total) by mouth daily. Start taking on:  11/05/2016   pantoprazole 20 MG tablet Commonly known as:  PROTONIX Take 1 tablet (20 mg total) by mouth daily. What changed:  when to take this  reasons to take this       Discharge Instructions: Please refer to Patient Instructions section of EMR for full details.  Patient was counseled important signs and symptoms that should prompt return to medical care, changes in medications, dietary instructions, activity restrictions, and follow up appointments.   Follow-Up Appointments: Follow-up Information    [facility]. Call.   Why:  To Schedule Hospital Follow Up appointment.  Contact information: [address] [place], [place] [address] [phone]          Hyman Bible, MD PGY-2 11/04/2016, [place]

## 2016-11-02 NOTE — Consult Note (Signed)
Cardiology Consult    Patient ID: Katherine Terrell MRN: [id number], DOB/AGE: 04-10-1961   Admit date: 11/01/2016 Date of Consult: 11/02/2016  Primary Physician: [facility] Primary Cardiologist: Dr. Fletcher Anon Requesting Provider: Dr. Gwendlyn Deutscher Reason for Consultation: CHF, NSVT  Patient Profile    56 yo female with PMH of NICM (EF 25-30%), CLL, HLD, HTN who presented with worsening dyspnea and chest discomfort.   Past Medical History   Past Medical History:  Diagnosis Date  . Cardiomyopathy    echo: (11/11) EF 25-30% with diffuse hypokinesis, mild left atrial enlargement. 11/11 HIV and ANA negative. TSH normal. LHC (1/12): EF 50-55% no angiographic CAD. Echo (2/12): EF 55-60% moderate LV hypertrophy, grade I diastolic dysfunction, mild MR, normal RV  . Chronic lymphocytic leukemia ([place])    followed by Dr. Ralene Ok  . Hyperlipidemia   . Hypertension   . Leukemia ([place])   . Multinodular goiter    with hypothyroidism    Past Surgical History:  Procedure Laterality Date  . CARDIAC CATHETERIZATION  2012   @ [facility]  . CARDIAC CATHETERIZATION N/A 12/13/2015   Procedure: Left Heart Cath and Coronary Angiography;  Surgeon: Larey Dresser, MD;  Location: [place] [facility];  Service: Cardiovascular;  Laterality: N/A;     Allergies  Allergies  Allergen Reactions  . Tramadol Nausea And Vomiting    History of Present Illness    Katherine Terrell is a 56 yo female with PMH of NICM (EF 25-30%), CLL, HLD, HTN. 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 Echo 10/2015 showed EF of 30-35%, mild AS and moderate MR and moderate to severe pulmonary hypertension.  Also with a h/o CLL, hypothyroidism,  HTN and HLD. Her medical management for HF at that time included Coreg, lisinopril and furosemide + supplemental  K.    She presented to the ED back on 10/05/16 with c/o fatigue,  orthopnea, and ongoing dyspnea. CXR & BNP consistent with CHF. BNP 1,012.7. EKG at that time showed Sr with PVCs. Also had an elevated K+ that improved after kayexalate. During that time she diuresed well. Her lisinopril was held at the time of discharge. Notes report that she was to be taking lasix 20-40mg  daily based on her weight. Trop with mild elevation felt to be related to demand ischemia.   She presented back to the ED on 11/01/16 with chest pressure and dyspnea. States she was orthopneic at home. Weight was not significantly elevated above baseline. BNP was 1517, up slight from 1012 her previous admission. EKG showed SR with freq PVCs. She was started on IV lasix with little UOP but reported significant improvement in her breathing. Her home dose of coreg was continued on admission.   On telemetry she is noted to have significant ectopy with bigemy, and 3/4 beat NSVT. Reports she feels palpitations at times. States her energy level at home has been getting worse. At times is barely able to walk from the bed to the bathroom without becoming fatigued. Also has been unable to return to work a she fatigues to quickly. No further chest pain this admission.  Inpatient Medications    . aspirin EC  81 mg Oral Daily  . carvedilol  6.25 mg Oral BID WC  . enoxaparin (LOVENOX) injection  40 mg Subcutaneous Q24H  . furosemide  40 mg Intravenous Daily  . hydrALAZINE  10 mg Oral TID  .  levothyroxine  50 mcg Oral QAC breakfast  . sodium chloride flush  3 mL Intravenous Q12H  . sodium chloride flush  3 mL Intravenous Q12H    Family History    Family History  Problem Relation Age of Onset  . Heart attack Mother 73  . Diabetes Sister     Social History    Social History   Social History  . Marital status: Single    Spouse name: N/A  . Number of children: N/A  . Years of education: N/A   Occupational History  . Works in [employer].     Social History Main Topics  . Smoking status: Never  Smoker  . Smokeless tobacco: Never Used  . Alcohol use No  . Drug use: No  . Sexual activity: Not Currently    Birth control/ protection: None   Other Topics Concern  . Not on file   Social History Narrative   From [place], Lives in [place]. Speaks minimal English.      Review of Systems    General:  No chills, fever, night sweats or weight changes.  Cardiovascular: See HPI Dermatological: No rash, lesions/masses Respiratory: No cough, ++ dyspnea Urologic: No hematuria, dysuria Abdominal:   No nausea, vomiting, diarrhea, bright red blood per rectum, melena, or hematemesis Neurologic:  No visual changes, wkns, changes in mental status. All other systems reviewed and are otherwise negative except as noted above.  Physical Exam    Blood pressure 111/66, pulse 79, temperature 97.5 F (36.4 C), temperature source Oral, resp. rate 17, height 5\' 3"  (1.6 m), weight 173 lb 6.4 oz (78.7 kg), SpO2 98 %.  General: Pleasant female, NAD Psych: Normal affect. Neuro: Alert and oriented X 3. Moves all extremities spontaneously. HEENT: Normal  Neck: Supple without bruits or JVD. Lungs:  Resp regular and unlabored, CTA. Heart: RRR no s3, s4, 2/6 systolic murmur. Abdomen: Soft, non-tender, non-distended, BS + x 4.  Extremities: No clubbing, cyanosis or edema. DP/PT/Radials 2+ and equal bilaterally.  Labs    Troponin [facility] Test)  Recent Labs  11/01/16 0650  TROPIPOC 0.00    Recent Labs  11/01/16 1200 11/01/16 1819 11/01/16 2322  TROPONINI 0.04* 0.05* 0.05*   Lab Results  Component Value Date   WBC 145.0 (HH) 11/02/2016   HGB 8.7 (L) 11/02/2016   HCT 29.8 (L) 11/02/2016   MCV 89.8 11/02/2016   PLT 181 11/02/2016    Recent Labs Lab 11/01/16 0627  11/02/16 0326  NA 139  --  138  K 4.8  --  4.5  CL 110  --  104  CO2 26  --  28  BUN 17  --  16  CREATININE 1.12*  < > 1.28*  CALCIUM 8.6*  --  8.8*  PROT 6.3*  --   --   BILITOT 0.4  --   --   ALKPHOS  103  --   --   ALT 15  --   --   AST 31  --   --   GLUCOSE 143*  --  146*  < > = values in this interval not displayed. Lab Results  Component Value Date   CHOL 196 11/01/2016   HDL 33 (L) 11/01/2016   LDLCALC 141 (H) 11/01/2016   TRIG 111 11/01/2016   No results found for: [facility]   Radiology Studies    Dg Chest 2 View  Result Date: 11/01/2016 CLINICAL DATA:  Shortness of breath, chest tightness. History of CHF,  cardiomyopathy. EXAM: CHEST  2 VIEW COMPARISON:  PA and lateral chest x-ray of October 05, 2016 FINDINGS: The lungs are adequately inflated. The interstitial markings are chronically increased. There is confluent density in the right infrahilar region likely in the middle lobe. There is no significant pleural effusion. The cardiac silhouette remains enlarged. The pulmonary vascularity is mildly prominent centrally. The trachea is midline. The bony thorax exhibits no acute abnormality. IMPRESSION: Slightly increased prominence of the pulmonary interstitium and pulmonary vascularity consistent low-grade acute CHF superimposed on chronic CHF. Atelectasis anteriorly in the right middle lobe is suspected. Electronically Signed   By: David  Martinique M.D.   On: 11/01/2016 07:25   Dg Chest 2 View  Result Date: 10/05/2016 CLINICAL DATA:  Productive cough for 2 weeks EXAM: CHEST  2 VIEW COMPARISON:  10/04/2016 FINDINGS: There is bilateral mild interstitial thickening. There is no focal parenchymal opacity. There is no pleural effusion or pneumothorax. There is stable cardiomegaly. The osseous structures are unremarkable. IMPRESSION: Cardiomegaly with mild pulmonary vascular congestion. Electronically Signed   By: Kathreen Devoid   On: 10/05/2016 17:45   Dg Chest 2 View  Result Date: 10/04/2016 CLINICAL DATA:  56 year old female with cough for 2 weeks. Increased congestion and shortness of breath recently. Initial encounter. EXAM: CHEST  2 VIEW COMPARISON:  2016/06/28 and earlier. FINDINGS: Chronic  cardiomegaly is stable. Other mediastinal contours are within normal limits. Visualized tracheal air column is within normal limits. No pneumothorax or pleural effusion. Chronic increased interstitial markings, but less pronounced than in 25-Jul-2023. Still, the interstitial opacity appears to be slightly above baseline. No consolidation or confluent pulmonary opacity. No acute osseous abnormality identified. IMPRESSION: 1. Chronic cardiomegaly. Mild pulmonary interstitial edema suspected today, but not as pronounced on 06-28-2016. No pleural effusion. 2. Acute viral/ atypical respiratory infection is difficult to exclude. No focal pneumonia. Electronically Signed   By: Genevie Ann M.D.   On: 10/04/2016 10:24   US Abdomen Limited Ruq  Result Date: 10/07/2016 CLINICAL DATA:  Abdominal pain 1 day.  History of CLL. EXAM: US ABDOMEN LIMITED - RIGHT UPPER QUADRANT COMPARISON:  CT 29-Jun-2015 FINDINGS: Gallbladder: Single 2.2 cm gallstone is present. No significant gallbladder wall thickening. There are a few punctate echogenic foci within the gallbladder wall likely due to adenomyomatosis/cholesterolosis. Negative sonographic Murphy's sign. No adjacent free fluid. Common bile duct: Diameter: 3.5 mm. Liver: No focal lesion identified. Within normal limits in parenchymal echogenicity. Normal directional flow within the portal vein. Small right pleural effusion. IMPRESSION: Single 2.2 cm gallstone. No additional sonographic evidence to suggest cholecystitis. Tiny right pleural effusion. Electronically Signed   By: Marin Olp M.D.   On: 10/07/2016 07:43    ECG & Cardiac Imaging    EKG: SR with PVCs  Echo: 10/07/16  Study Conclusions  - Left ventricle: The cavity size was mildly dilated. Wall   thickness was normal. Systolic function was severely reduced. The   estimated ejection fraction was in the range of 25% to 30%.   Diffuse hypokinesis. Doppler parameters are consistent with a   reversible restrictive  pattern, indicative of decreased left   ventricular diastolic compliance and/or increased left atrial   pressure (grade 3 diastolic dysfunction). - Aortic valve: Trileaflet; mildly thickened, mildly calcified   leaflets. - Mitral valve: There was mild regurgitation. - Left atrium: The atrium was moderately dilated. - Right atrium: The atrium was mildly dilated. - Tricuspid valve: There was moderate regurgitation. - Pulmonary arteries: Systolic pressure was moderately increased.  PA peak pressure: 52 mm Hg (S).  Impressions:  - Compared to the prior study, there has been no significant   interval change.  Assessment & Plan    56 yo female with PMH of NICM (EF 25-30%), CLL, HLD, HTN who presented with worsening dyspnea and chest discomfort.   1. Acute on chronic combined HF: Echo last admission showed slightly worsened EF from previous in 2/17 even with medical therapy. Was on BB, ACEi, lasix, asa but ACEi, held at discharge given hyperkalemia. Was suppose to be taking lasix at home, but there must have been some confusion as she reports not taking lasix at home. Given IV lasix this admission with little UOP but reports significant improvement in breathing.  -- continue BB, hydralazine  -- Likely transition back to PO lasix tomorrow as she seems volume stable. -- need to resume ACEi/ARB therapy this admission at K+ is stable. Consider ARB as possible transition to [facility] if able to afford.   2. Ventricular Ectopy: Noted to have freq PVCs, and short freq NSVT on telemetry. Does feel palpitations at times. On BB therapy, but has not had significant improvement in EF. Planned to see EP in the outpt setting this month for possible ICD, may need to see this admission.   3. HTN: Controlled.   4. HLD: lipid panel this admission with LDL above target. Would benefit from statin therapy.   5. CLL: Followed by Dr. Ralene Ok. WBC 145, which appears around her baseline.   6. Elevated Trop:  Mild flat non ACS trend. Likely demand ischemia in the setting of CHF.   7. Anemia: Hgb 8.7 today, down from 9.1. No reports of bleeding.   Barnet Pall, NP-C Pager [phone] 11/02/2016, 1:18 PM   The patient has been seen in conjunction with Reino Bellis, NP-C. All aspects of care have been considered and discussed. The patient has been personally interviewed, examined, and all clinical data has been reviewed.   Recurrent symptoms of heart failure. Known nonischemic cardiomyopathy with EF less than 30%. Unfortunately, I do not have interpreter present during initial evaluation.  Recurrent heart failure seems to be related to absence of loop diuretic therapy following discharge in mid December. I have additional concerns in the setting of chest discomfort and heart failure in absence of CAD that anemia (low oxygen carrying capacity) and marked leukocytosis (hyperviscosity) could be aggravating her underlying cardiovascular problem.  Plan to increase diuresis. Need to determine if hematology oncology feels that elevated white count could be producing hyperviscosity which can aggravate heart failure. I cannot recall the cell count threshold for hyperviscosity.  Diuresis and monitoring of kidney function is important at this time. At discharge, diuretic therapy should be continuous. It will be important to have good communication prior to discharge.

## 2016-11-02 NOTE — Progress Notes (Signed)
Family Medicine Teaching Service Daily Progress Note Intern Pager: [phone]  Patient name: Katherine Terrell Medical record number: [id number] Date of birth: 16-Aug-1961 Age: 56 y.o. Gender: female  Primary Care Provider: [facility] Consultants: Cardiology Code Status: Full  Pt Overview and Major Events to Date:  None  Assessment and Plan: [name] is a 56 y.o. female presenting with chest pain and shortness of breath. 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  Chest Pain/SOB, likely 2/2 CHF exacerbation: This has improved since admission yesterday in regards to her shortness of breath and her chest pain has resolved as she has not had any chest pain this morning. In addition, on exam there are no crackles on auscultation of the lungs and she has no lower extremity edema. Because of this improvement with treatment with Lasix, I believe a CHF exacerbation is the most likely cause of her dyspnea and chest pain.  - Continue Lasix 40 mg IV QD. - Continue home Coreg 6.25 mg PO BID and Aspirin 81 mg PO QD. - Cardiology has been consulted and they are to see her. - Daily weights, strict I/Os - Daily AM EKG - Daily AM CBC and BMP - Pt scheduled to see Cardiology on 1/9 for evaluation of ICD placement. - Cardiac monitoring. - Vitals per unit routine  Combined Diastolic and Systolic Heart Failure: Echo 10/07/16 showed EF of 25-30 %, grade 3 diastolic dysfunction. Home regimen currently includes Coreg 6.25 mg PO BID and aspirin 81 mg PO QD. Patient previously on ACE and spironolactone, but those were held per cards for hyperkalemia to 6.1 at last admission. In regards to her acute exacerbation of heart failure, I think that this is improving as detailed above. - Continue home regimen as detailed above.  - Consider restarting very low dose ACE/ARB, and/or spironolactone due to evidence with improved mortality in patients with heart  failure w/ reduced EF.  Recurrent V-tach/ recent multiple PVCs: Patient noted to have recurrent v-tach on telemetry yesterday, with rates ranging between 100-120. PVCs on EKG this morning as she had multiple early beats noted on physical exam and on the cardiac monitor. I am unsure of the etiology causing this at this time, but I do know that she has an appointment scheduled to see cardiology for determination of whether device implantation is necessary. - Cardiology consult has been placed and they are to see. - Continue cardiac monitoring.   CLL, active: Diagnosed in 2011. Followed by Dr. Ralene Ok. WBC yesterday was 142.3 and 145.0 today, which is within her baseline. - Monitor with daily CBCs.   Normocytic Anemia: Likely anemia of chronic disease. Hgb 8.7 this morning, down from 9.0 yesterday. Baseline is 8-9. - Monitor with daily CBCs.  HTN: Blood pressure 111/66 this morning; stable. She is not on an ace-inhibitor or potassium-sparing diuretic because of hyperkalemia in the past when taking these medications.  - Continue home Coreg 6.25 mg PO BID and Hydralazine 10 mg PO TID.   Hypothyroidism: Last TSH 10/05/16 3.882. - Continue home Synthroid 28mcg daily  HLD: Lipid panel obtained yesterday showed total cholesterol of 196, triglycerides of 111, HDL of 33 and LDL 141. ASCVD risk calculated and found to be 3.0 %. - No indication for statin treatment at this time.   Hyperglycemia: Glucose 143 on BMP yesterday and 146 today. A1c was obtained yesterday and found to be 5.6. The A1c is a 21-month average and as such if her blood sugars  are just now starting to become elevated, then we would anticipate this would also increase with time as well.  - Recommend following A1c in about 3 months.  FEN/GI: Heart healthy diet  Prophylaxis: Lovenox for VTE  Disposition: Admit to stepdown.   Subjective:  She had no acute events overnight and states that this morning she feels mild to  moderately better than she did yesterday when she presented to the hospital in regards to her shortness of breath. She has no complaints of chest pain at this time.   Objective: Temp:  [97.5 F (36.4 C)-98.2 F (36.8 C)] 97.5 F (36.4 C) (01/04 0753) Pulse Rate:  [79-99] 79 (01/04 0753) Resp:  [17-38] 17 (01/03 1649) BP: (107-161)/(57-95) 111/66 (01/04 0338) SpO2:  [90 %-100 %] 98 % (01/04 0753) Weight:  [78.7 kg (173 lb 6.4 oz)-78.8 kg (173 lb 12.8 oz)] 78.7 kg (173 lb 6.4 oz) (01/04 [id number]) Physical Exam: General: Appears chronically ill, but very conversant.  Cardiovascular: Upon radiation of the radial pulse and auscultation of the heart she has a regular rate, but an irregular rhythm with multiple early beats. Cardiac monitor showed upper limit of normal rate during physical exam. There is a II-III/VI systolic ejection murmur heard best at the left upper sternal border without radiation into the carotids. + S3.  Respiratory: She has a normal work of breathing without any tripod posturing. There are faint expiratory wheezes at the bases, but no crackles.  Abdomen: The abdomen is soft, non-tender and non-distended.  Extremities: There is no lower extremity edema present.   Laboratory:  Recent Labs Lab 11/01/16 0627 11/01/16 1325 11/02/16 0326  WBC 142.3* 148.3* 145.0*  HGB 9.0* 9.1* 8.7*  HCT 30.3* 30.6* 29.8*  PLT 194 199 181    Recent Labs Lab 11/01/16 0627 11/01/16 1325 11/02/16 0326  NA 139  --  138  K 4.8  --  4.5  CL 110  --  104  CO2 26  --  28  BUN 17  --  16  CREATININE 1.12* 1.14* 1.28*  CALCIUM 8.6*  --  8.8*  PROT 6.3*  --   --   BILITOT 0.4  --   --   ALKPHOS 103  --   --   ALT 15  --   --   AST 31  --   --   GLUCOSE 143*  --  146*   A1c yesterday was 5.6.  Lipid panel yesterday showed a total cholesterol of 196, triglycerides to 111, HDL of 33 and LDL of 141.    Imaging/Diagnostic Tests: None  Jacklynn Ganong, MS4 11/02/2016, 9:39 AM  [place] Intern pager: [phone], text pages welcome   Upper Level Addendum:  I have seen and evaluated this patient along with Student Dr. Trilby Drummer and reviewed the above note, making necessary revisions in red.   Below is my own personal physical exam: Gen: NAD, comfortably asleep in bed upon arrival to bedside. Wakes easily. Spanish speaking. Cardiovascular: mostly RRR with occasional irregularities. II/VI systolic murmur at the left sternal border. No radiation. Heave visually present.  Respiratory: CTAB, faint crackles noted with deep breathing that disappear with cough (likely atelectasis).  Abdomen: The abdomen is soft, non-tender and non-distended. +BS Extremities: There is no lower extremity edema present. Dorsalis pedis and posterior tib pulses present bilat and equal.   Elberta Leatherwood, MD,MS,  PGY3 11/02/2016 2:07 PM

## 2016-11-03 DIAGNOSIS — I5021 Acute systolic (congestive) heart failure: Secondary | ICD-10-CM

## 2016-11-03 DIAGNOSIS — I493 Ventricular premature depolarization: Secondary | ICD-10-CM

## 2016-11-03 LAB — BASIC METABOLIC PANEL
Anion gap: 8 (ref 5–15)
BUN: 13 mg/dL (ref 6–20)
CALCIUM: 8.8 mg/dL — AB (ref 8.9–10.3)
CO2: 29 mmol/L (ref 22–32)
CREATININE: 1.19 mg/dL — AB (ref 0.44–1.00)
Chloride: 102 mmol/L (ref 101–111)
GFR calc non Af Amer: 51 mL/min — ABNORMAL LOW (ref >60)
GFR, EST AFRICAN AMERICAN: 59 mL/min — AB (ref >60)
GLUCOSE: 108 mg/dL — AB (ref 65–99)
Potassium: 3.2 mmol/L — ABNORMAL LOW (ref 3.5–5.1)
Sodium: 139 mmol/L (ref 135–145)

## 2016-11-03 MED ORDER — FUROSEMIDE 40 MG PO TABS
40.0000 mg | ORAL_TABLET | Freq: Every day | ORAL | Status: DC
  Administered 2016-11-04: 40 mg via ORAL
  Filled 2016-11-03: qty 1

## 2016-11-03 MED ORDER — LISINOPRIL 5 MG PO TABS
5.0000 mg | ORAL_TABLET | Freq: Every day | ORAL | Status: DC
  Administered 2016-11-03 – 2016-11-04 (×2): 5 mg via ORAL
  Filled 2016-11-03 (×2): qty 1

## 2016-11-03 MED ORDER — POTASSIUM CHLORIDE CRYS ER 20 MEQ PO TBCR
40.0000 meq | EXTENDED_RELEASE_TABLET | Freq: Two times a day (BID) | ORAL | Status: AC
Start: 2016-11-03 — End: 2016-11-03
  Administered 2016-11-03 (×2): 40 meq via ORAL
  Filled 2016-11-03 (×2): qty 2

## 2016-11-03 NOTE — Progress Notes (Signed)
Interpreter Lesle Chris for [facility] ans CSW [place]

## 2016-11-03 NOTE — Discharge Instructions (Signed)
You were hospitalized because you had a worsening of your congestive heart failure. We gave you Lasix through your IV and you were able to get off some of the extra fluid. We are sending you home with Lasix. Please take 1 tablet (40mg ) daily.  You have an appointment scheduled with the cardiologist on 1/9. Please make sure that you go to this appointment. They will discuss putting a pacemaker into your heart.  Usted fue hospitalizado porque empeor su insuficiencia cardaca congestiva. Le dimos Lasix por va intravenosa y usted pudo extraer parte del lquido extra. Te enviamos a casa con Lasix. Por favor tome 1 tableta (40mg ) diariamente.   Tiene una cita programada con el cardilogo el 1/9. Por favor, asegrese de ir a esta cita. Discutirn poner un marcapasos en su corazn.

## 2016-11-03 NOTE — Progress Notes (Signed)
Family Medicine Teaching Service Daily Progress Note Intern Pager: [phone]  Patient name: Katherine Terrell Medical record number: [id number] Date of birth: 1960/12/29 Age: 56 y.o. Gender: female  Primary Care Provider: [facility] Consultants: Cardiology Code Status: Full  Pt Overview and Major Events to Date:  11/01/16: Admitted to FMTS with CHF exacerbation  Assessment and Plan: Katherine Terrell a 56 y.o.femalepresenting with chest pain and shortness of breath. 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  CHF exacerbation, improving: Pt with history of combined diastolic and systolic heart failure with most recent ECHO showing EF 25-30%. Weight 169 from 174 lb on admission. UOP 1L. Net -2.3L since admission. Lungs clear with comfortable work of breathing. - Continue Lasix 40 mg PO QD.  - Continue home Coreg 6.25 mg PO BID and Aspirin 81 mg PO QD. - Lisinopril added back 1/5, as K has been stable. - Daily weights, strict I/Os - Cardiology following, appreciate recommendations. Think her elevated WBC count could potentially be causing hyperviscosity that may be aggravating her heart failure.  - Pt scheduled to see Cardiology on 1/9 for evaluation of ICD placement.  Ventricular Ectopy, improving: Pt with multiple PVCs and intermittent non-sustained V tach on tele. HRs have been in the 60s-80s and she was in normal sinus rhythm this morning. Pt feeling occasional palpitations. - Discussed with Dr. Lovena Le (EP) over the phone, who does not feel that she warrants inpatient EP consultation and that she can be followed as an outpatient. - Has appointment with cardiac EP on 11/07/16. - Continue Coreg 6.25mg  bid - Continue cardiac monitoring.   Hypotension with hx of HTN: BPs have been low since restarting Lisinopril, down to 87/54 last night. - Continue home Coreg 6.25 mg PO BID and Lisinopril 5mg  PO qd. May need to decrease  Lisinopril to 2.5 qd. - Stop Hydralazine 10 mg PO TID.   CLL, active: Diagnosed in 2011. Followed by Dr. Ralene Ok.  - Monitor  Normocytic Anemia, stable: Likely anemia of chronic disease. Baseline is 8-9.  Hypothyroidism: Last TSH 10/05/16 3.882. - Continue home Synthroid 64mcg daily  HLD: Hyperlipidemia but not on statin treatment at this time as ASCVD is 3.0 %. - Follow-up as outpatient  Hyperglycemia: A1c during admission was found to be 5.6.  - Recommend following A1c in about 3 months.  FEN/GI: Heart healthy diet  Prophylaxis: Lovenox for VTE  Disposition: Plan for discharge home today.  Subjective:  Pt states she is doing fine this morning. No chest pain, no shortness of breath. She states she is having occasional palpitations.  Objective: Temp:  [97.8 F (36.6 C)-98.3 F (36.8 C)] 98.1 F (36.7 C) (01/05 1942) Pulse Rate:  [75-79] 75 (01/05 1942) Resp:  [16] 16 (01/05 1942) BP: (87-119)/(54-81) 87/54 (01/05 1942) SpO2:  [91 %-97 %] 91 % (01/05 1942) Weight:  [171 lb 6.4 oz (77.7 kg)] 171 lb 6.4 oz (77.7 kg) (01/05 0555) Physical Exam: General: Tired-appearing but non-toxic, laying in bed in NAD  Cardiovascular: RRR. No murmurs. No JVD.  Respiratory: Normal work of breathing. No wheezes, crackles or rhonchi.  Abdomen: Soft, non-tender and non-distended. Extremities: No lower extremity edema.   Laboratory:  Recent Labs Lab 11/01/16 0627 11/01/16 1325 11/02/16 0326  WBC 142.3* 148.3* 145.0*  HGB 9.0* 9.1* 8.7*  HCT 30.3* 30.6* 29.8*  PLT 194 199 181    Recent Labs Lab 11/01/16 0627 11/01/16 1325 11/02/16 0326 11/03/16 0439  NA 139  --  138 139  K 4.8  --  4.5 3.2*  CL 110  --  104 102  CO2 26  --  28 29  BUN 17  --  16 13  CREATININE 1.12* 1.14* 1.28* 1.19*  CALCIUM 8.6*  --  8.8* 8.8*  PROT 6.3*  --   --   --   BILITOT 0.4  --   --   --   ALKPHOS 103  --   --   --   ALT 15  --   --   --   AST 31  --   --   --   GLUCOSE 143*  --   146* 108*   Katherine Bible, MD [place], PGY-2

## 2016-11-03 NOTE — Progress Notes (Signed)
Family Medicine Teaching Service Daily Progress Note Intern Pager: [phone]  Patient name: Katherine Terrell Medical record number: [id number] Date of birth: 09-Jun-1961 Age: 56 y.o. Gender: female  Primary Care Provider: [facility] Consultants: Cardiology Code Status: Full  Pt Overview and Major Events to Date:  None  Assessment and Plan: Katherine Terrell a 56 y.o.femalepresenting with chest pain and shortness of breath. 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  Chest Pain/SOB, likely 2/2 CHF exacerbation: Continues to improve clinically based on symptoms and physical exam as well as weight is going down. - Switching to Lasix 40 mg PO QD.  - Continue home Coreg 6.25 mg PO BID and Aspirin 81 mg PO QD. - Daily weights, strict I/Os - Daily AM BMP - F/u Cards recs: PO lasix, resume ACE/ARB - Pt scheduled to see Cardiology on 1/9 for evaluation of ICD placement. - Cardiac monitoring. - Vitals per unit routine.  Hypokalemia: Potassium on BMP was 3.2 this morning. - Replaced with 40 mEq K BID for 2 doses today. - Plan to initiate lisinopril 5 mg PO QD. - Daily AM BMP.   Combined Diastolic and Systolic Heart Failure: Echo 10/07/16 showed EF of 25-30 %, grade 3 diastolic dysfunction. I think that this is improving as detailed above. - Continue home regimen as detailed above.  - Will restart lisinopril 5 mg PO QD.   Recurrent V-tach/ recent multiple PVCs: Heart rate WNL today, but still at upper limit of normal. Irregular beats noted on physical exam and cardiac monitor as well. No chest pain.  - Has appointment with cardiac EP on 11/07/16. - Continue cardiac monitoring.   CLL, active: Diagnosed in 2011. Followed by Dr. Ralene Terrell.   Normocytic Anemia: Likely anemia of chronic disease. Baseline is 8-9.  HTN: Blood pressure this morning is stable.  - Continue home Coreg 6.25 mg PO BID and Hydralazine 10 mg PO TID.  -  Initiating lisinopril 5 mg PO QD.  Hypothyroidism: Last TSH 10/05/16 3.882. - Continue home Synthroid 48mcg daily  HLD: Hyperlipidemia but not on statin treatment at this time as ASCVD is 3.0 %.  Hyperglycemia: A1c during admission was found to be 5.6.  - Recommend following A1c in about 3 months.  FEN/GI: Heart healthy diet  Prophylaxis: Lovenox for VTE  Disposition: Inpatient, floor. Dr. Gwendlyn Terrell.   Subjective:  Feels slightly better this morning in regards to shortness of breath than she did yesterday. Still has fatigue, especially at night.   Objective: Temp:  [97.8 F (36.6 C)-98.3 F (36.8 C)] 98.3 F (36.8 C) (01/05 0810) Pulse Rate:  [66-79] 79 (01/05 0005) Resp:  [16] 16 (01/05 0555) BP: (87-123)/(51-81) 116/68 (01/05 0555) SpO2:  [93 %-98 %] 96 % (01/05 0810) Weight:  [77.7 kg (171 lb 6.4 oz)] 77.7 kg (171 lb 6.4 oz) (01/05 0555) Physical Exam: General: Appears better in general assessment. Does not appear sick or as chronically ill.  Cardiovascular: Regular rate but irregular rhythm. No audible murmur heard this morning on exam. No JVD.  Respiratory: Normal work of breathing with no tripod posturing. No wheezes, crackles or rhonchi.  Abdomen: Soft, non-tender and non-distended. Extremities: No lower extremity edema.   Laboratory:  Recent Labs Lab 11/01/16 0627 11/01/16 1325 11/02/16 0326  WBC 142.3* 148.3* 145.0*  HGB 9.0* 9.1* 8.7*  HCT 30.3* 30.6* 29.8*  PLT 194 199 181    Recent Labs Lab 11/01/16 0627 11/01/16 1325 11/02/16 0326 11/03/16 0439  NA 139  --  138 139  K 4.8  --  4.5 3.2*  CL 110  --  104 102  CO2 26  --  28 29  BUN 17  --  16 13  CREATININE 1.12* 1.14* 1.28* 1.19*  CALCIUM 8.6*  --  8.8* 8.8*  PROT 6.3*  --   --   --   BILITOT 0.4  --   --   --   ALKPHOS 103  --   --   --   ALT 15  --   --   --   AST 31  --   --   --   GLUCOSE 143*  --  146* 108*   Katherine Terrell, MS4 11/03/2016, 9:45 AM [place]  Intern pager: [phone], text pages welcome   Upper Level Addendum:  I have seen and evaluated this patient along with Student Dr. Trilby Terrell and reviewed the above note, making necessary revisions in red.   Below is my own personal physical exam: Gen: NAD, comfortably in bed upon arrival to bedside. Spanish speaking. Cardiovascular: mostly RRR with occasional irregularities. II/VI systolic murmur at the left sternal border. No radiation. Heave visually present.  Respiratory: CTAB, much improved Abdomen: The abdomen is soft, non-tender and non-distended. +BS Extremities: There is no lower extremity edema present. Dorsalis pedis and posterior tib pulses present bilat and equal.  Katherine Leatherwood, MD,MS,  PGY3 11/03/2016 1:56 PM

## 2016-11-03 NOTE — Care Management Note (Addendum)
Case Management Note  Patient Details  Name: Katherine Terrell MRN: [id number] Date of Birth: [birth year]  Subjective/Objective:  Pt presented for CHF. CM did speak with pt via [place]. Pt is from [facility]. She lives alone in an apartment that cost $400.00 per month. Pt states she has support of daughter, however not able to assist with rent due to daughter has 4 children.  Pt was previously working, however no longer. Pt is without insurance. CM did reach out to CSW in regards to pt stating she is unable to pay her rent. Pt utilizes the [employer] in [place]. Per pt pharmacy is onsite.                    Action/Plan: CM did try to call the clinic however it closed @ 1:00 pm  on Friday. Pt will need to call for hospital f/u appointment if she is d/c over the weekend. No further needs from CM @ this time.   Expected Discharge Date:                  Expected Discharge Plan:  Home/Self Care  In-House Referral:  Clinical Social Work, Counselling psychologist (Paediatric nurse Interpreted. )  Discharge planning Services  CM Consult, [facility], Medication Assistance  Post Acute Care Choice:  NA Choice offered to:  NA  DME Arranged:  N/A DME Agency:  NA  HH Arranged:  NA HH Agency:  NA  Status of Service:  Completed, signed off  If discussed at [place] of Stay Meetings, dates discussed:    Additional Comments:  Bethena Roys, RN 11/03/2016, 2:44 PM

## 2016-11-03 NOTE — Progress Notes (Signed)
Interpreter Graciela Namihira for CSW  °

## 2016-11-03 NOTE — Progress Notes (Signed)
Evening dose of Coreg 6.25mg  held due to low BP of 90/53.  (Was 84/53 in left arm)  HR=74. Pt states she feels okay at the moment.

## 2016-11-04 DIAGNOSIS — E784 Other hyperlipidemia: Secondary | ICD-10-CM

## 2016-11-04 DIAGNOSIS — I493 Ventricular premature depolarization: Secondary | ICD-10-CM

## 2016-11-04 LAB — BASIC METABOLIC PANEL
Anion gap: 8 (ref 5–15)
BUN: 19 mg/dL (ref 6–20)
CALCIUM: 9 mg/dL (ref 8.9–10.3)
CO2: 27 mmol/L (ref 22–32)
CREATININE: 1.16 mg/dL — AB (ref 0.44–1.00)
Chloride: 105 mmol/L (ref 101–111)
GFR calc non Af Amer: 52 mL/min — ABNORMAL LOW (ref >60)
GLUCOSE: 101 mg/dL — AB (ref 65–99)
Potassium: 4.3 mmol/L (ref 3.5–5.1)
Sodium: 140 mmol/L (ref 135–145)

## 2016-11-04 MED ORDER — FUROSEMIDE 40 MG PO TABS: 40.0000 mg | ORAL_TABLET | Freq: Every day | ORAL | 0 refills | Status: DC

## 2016-11-04 MED ORDER — LISINOPRIL 5 MG PO TABS: 5.0000 mg | ORAL_TABLET | Freq: Every day | ORAL | 0 refills | Status: DC

## 2016-11-04 MED ORDER — CARVEDILOL 6.25 MG PO TABS: 6.2500 mg | ORAL_TABLET | Freq: Two times a day (BID) | ORAL | 11 refills | Status: AC

## 2016-11-07 ENCOUNTER — Encounter: Payer: Self-pay | Admitting: Internal Medicine

## 2016-11-07 ENCOUNTER — Telehealth: Payer: Self-pay | Admitting: Internal Medicine

## 2016-11-07 ENCOUNTER — Ambulatory Visit (INDEPENDENT_AMBULATORY_CARE_PROVIDER_SITE_OTHER): Payer: Self-pay | Admitting: Internal Medicine

## 2016-11-07 VITALS — BP 114/76 | HR 83 | Ht 63.0 in | Wt 172.0 lb

## 2016-11-07 DIAGNOSIS — I5022 Chronic systolic (congestive) heart failure: Secondary | ICD-10-CM

## 2016-11-07 MED ORDER — SACUBITRIL-VALSARTAN 24-26 MG PO TABS: 1.0000 | ORAL_TABLET | Freq: Two times a day (BID) | ORAL | 5 refills | Status: AC

## 2016-11-07 MED ORDER — FUROSEMIDE 40 MG PO TABS: 40.0000 mg | ORAL_TABLET | Freq: Every day | ORAL | 4 refills | Status: AC

## 2016-11-07 NOTE — Patient Instructions (Addendum)
Medication Instructions:  Your physician has recommended you make the following change in your medication:  1) STOP Lisinipril 2) START Entresto 24/26 mg twice a day 36 hours after stopping Lisinipril    Labwork: None Ordered   Testing/Procedures: Your physician has requested that you have an echocardiogram. Echocardiography is a painless test that uses sound waves to create images of your heart. It provides your doctor with information about the size and shape of your heart and how well your heart's chambers and valves are working. This procedure takes approximately one hour. There are no restrictions for this procedure.--schedule in April 2018    Follow-Up: Your physician recommends that you schedule a follow-up appointment in: April 2018 with Dr. Lovena Le (2 weeks after echo)   Any Other Special Instructions Will Be Listed Below (If Applicable). Patient may return to work on 11/17/16.    If you need a refill on your cardiac medications before your next appointment, please call your pharmacy.

## 2016-11-07 NOTE — Progress Notes (Signed)
HPI The patient is a 56 yo woman referred for ICD consideration. She has had worsening sob and was hospitalized a week ago. She has a h/o LV dysfunction dating back 5 years. She has a h/o CLL which has been in remission. Her WBC is 145K. Her EF was in the 45% range but has recently worsened. She notes a remote episode of syncope over a year ago. She has class 2-3 symptoms. She denies medical non-compliance. No edema. Denies PND or orthopnea. She was noted to have NSVT on tele.  Allergies  Allergen Reactions  . Tramadol Nausea And Vomiting     Current Outpatient Prescriptions  Medication Sig Dispense Refill  . acetaminophen (TYLENOL) 500 MG tablet Take 500 mg by mouth every 6 (six) hours as needed.    Marland Kitchen aspirin EC 81 MG tablet Take 1 tablet (81 mg total) by mouth daily. 30 tablet 0  . carvedilol (COREG) 6.25 MG tablet Take 1 tablet (6.25 mg total) by mouth 2 (two) times daily with a meal. 60 tablet 11  . furosemide (LASIX) 40 MG tablet Take 1 tablet (40 mg total) by mouth daily. 30 tablet 4  . levothyroxine (SYNTHROID, LEVOTHROID) 50 MCG tablet Take 1 tablet (50 mcg total) by mouth daily before breakfast. 30 tablet 0  . sacubitril-valsartan (ENTRESTO) 24-26 MG Take 1 tablet by mouth 2 (two) times daily. 60 tablet 5   No current facility-administered medications for this visit.      Past Medical History:  Diagnosis Date  . Cardiomyopathy    echo: (11/11) EF 25-30% with diffuse hypokinesis, mild left atrial enlargement. 11/11 HIV and ANA negative. TSH normal. LHC (1/12): EF 50-55% no angiographic CAD. Echo (2/12): EF 55-60% moderate LV hypertrophy, grade I diastolic dysfunction, mild MR, normal RV  . Chronic lymphocytic leukemia ([place])    followed by Dr. Ralene Ok  . Hyperlipidemia   . Hypertension   . Leukemia ([place])   . Multinodular goiter    with hypothyroidism    ROS:   All systems reviewed and negative except as noted in the HPI.   Past Surgical History:  Procedure  Laterality Date  . CARDIAC CATHETERIZATION  2012   @ [place]  . CARDIAC CATHETERIZATION N/A 12/13/2015   Procedure: Left Heart Cath and Coronary Angiography;  Surgeon: Larey Dresser, MD;  Location: [facility];  Service: Cardiovascular;  Laterality: N/A;     Family History  Problem Relation Age of Onset  . Heart attack Mother 24  . Diabetes Sister      Social History   Social History  . Marital status: Single    Spouse name: N/A  . Number of children: N/A  . Years of education: N/A   Occupational History  . Works in [employer].     Social History Main Topics  . Smoking status: Never Smoker  . Smokeless tobacco: Never Used  . Alcohol use No  . Drug use: No  . Sexual activity: Not Currently    Birth control/ protection: None   Other Topics Concern  . Not on file   Social History Narrative   From [place], Lives in [place]. Speaks minimal English.      BP 114/76   Pulse 83   Ht 5\' 3"  (1.6 m)   Wt 172 lb (78 kg)   BMI 30.47 kg/m   Physical Exam:  Well appearing middle aged woman, NAD HEENT: Unremarkable Neck:  6 cm JVD, no thyromegally Lymphatics:  No adenopathy Back:  No CVA tenderness Lungs:  Clear with no wheezes HEART:  Regular rate rhythm, no murmurs, no rubs, no clicks Abd:  soft, positive bowel sounds, no organomegally, no rebound, no guarding Ext:  2 plus pulses, no edema, no cyanosis, no clubbing Skin:  No rashes no nodules Neuro:  CN II through XII intact, motor grossly intact  EKG - NSR with PVC's, LVH and ILBBB   Assess/Plan: 1. Acute on chronic systolic heart failure - she appears to be euvolemic although she missed a day of lasix. I would like to switch her to [facility] from Lisinopril. We will repeat her 2D echo in 3 months on maximal medical therapy to see if her LV function improves. She does not have an indication for a BiV device as her QRS duration is not long enough. I did discuss the indications and limitations of ICD  insertion as well as the risks/benefits. 2. CLL - her WBC is elevated. At some point she will need referral back to hem/onc clinic. 3. HTN heart disease - her blood pressure is controlled. Will follow.  Mikle Bosworth.D.

## 2016-11-07 NOTE — Telephone Encounter (Signed)
Called, spoke with the [facility] at [employer]. Elta Guadeloupe had questions about medications Lisinopril, Furosemide, and Entresto.  Informed Dr. Lovena Le d/c'd Lisinopril today. Dr. Lovena Le Rx Delene Loll (pt was instructions to start Entresto 24-26 mg twice daily 36 hours after stopping Lisinopril. Dr. Lovena Le refilled Furosemide 40 mg daily. Mark verbalized understanding.

## 2016-11-07 NOTE — Telephone Encounter (Signed)
°  New Prob   Calling for clarification on Entresto and Lisinopril. Please call.

## 2016-11-08 ENCOUNTER — Telehealth: Payer: Self-pay | Admitting: Internal Medicine

## 2016-11-08 ENCOUNTER — Telehealth: Payer: Self-pay | Admitting: Cardiovascular Disease

## 2016-11-08 ENCOUNTER — Telehealth (HOSPITAL_COMMUNITY): Payer: Self-pay | Admitting: *Deleted

## 2016-11-08 NOTE — Telephone Encounter (Signed)
Pt calls w/her interpreter Verdis Frederickson. States she saw Dr. Lovena Le yesterday who prescribed Entresto. She cannot afford the $292.17/month. Pt has applied for medicaid; has no other insurance. She did not receive a 30-day free card. I offered to leave this at the front desk  but she asks for it to be mailed. I have also mailed patient assistance information.   Today, she reports 7/10 non-radiating pain "right where the heart is" which comes and goes and is accompanied by SOB making it necessary to sleep on 3 pillows at night. She denies nausea/vomiting, indigestion sx or back pain but is dizzy and sweating.  Sx are not new as she states they started last March after she was diagnosed w/pneumonia. She told Dr. Lovena Le of these sx yesterday. Lisinopril was d/c'd and entresto added. She will have repeat echo in 2 months (current EF 25-30%) to determine if LV function improves.  Advised pt to start [facility] as soon as possible and continue to monitor sx. Reviewed sx that would require immediate attention in an ER setting. She verbalized understanding.  States she would like to go to the hospital for an evaluation.  Pt had no further questions at this time.   Marland Kitchen

## 2016-11-08 NOTE — Telephone Encounter (Signed)
Pt c/o of Chest Pain: STAT if CP now or developed within 24 hours  1. Are you having CP right now? Yes   2. Are you experiencing any other symptoms (ex. SOB, nausea, vomiting, sweating)? SOB,sweating, states she can't even stand   3. How long have you been experiencing CP? Just today   4. Is your CP continuous or coming and going? Constant since this morning   5. Have you taken Nitroglycerin?  ?  Pt is on the phone with interpreter

## 2016-11-08 NOTE — Telephone Encounter (Signed)
Gregary Signs, an interpreter for [facility] came by on the pt's behalf.  She states the pt called her concerned about her new medication, entresto.  Pt saw Dr Lovena Le yesterday and was switched from Lisinopril to [facility].  She states the pt has no insurance and is not able to afford the medication and would like to know what to do.  She states pt is not a [place] citizen, so she will not qualify for [employer] pt assit program.  Advised no other options for pt at this time, if she gets insurance then she could get med.  Advised pt should resume her Lisinopril as she was on prior to seeing Dr Lovena Le yesterday, will send him and his office the information, if they have other recommendations they can call pt to f/u.  She is aware and will notify pt.

## 2016-11-08 NOTE — Telephone Encounter (Signed)
[employer] notes indicate pt is being advised to continue lisinopril as she can not afford Entresto. I have taken the entresto coupon and patient assistance information out of the outgoing mail so patient will not receive this.

## 2016-11-14 ENCOUNTER — Telehealth: Payer: Self-pay

## 2016-11-14 NOTE — Telephone Encounter (Signed)
Patient contacted by [place] and spoke with Carollee Sires RN, here. She has not been able to afford Entresto, so therefore has not started it yet. She has no Insurance. Coupon for 30 day free trial mailed to her. Also gave her the number for the PAN, to help with access to the med. Per Delene Loll Rep, she may need to re-apply after 4 months or so when grant runs out.

## 2016-11-17 ENCOUNTER — Telehealth: Payer: Self-pay | Admitting: Cardiovascular Disease

## 2016-11-17 NOTE — Telephone Encounter (Signed)
Patient never received the medication assistance coupon in the mail .  This medication was for the heart.  She was seen on Jan 9 and doesn't remember what it is called.    Patient is not sure if this is Entresto patient offered samples but unable to drive.  Please call about mailing the coupon.  Patient is still having pain.

## 2016-11-17 NOTE — Telephone Encounter (Signed)
Per Jan 16 note, pt has been mailed 30 day free Entresto coupon

## 2017-02-09 ENCOUNTER — Ambulatory Visit (HOSPITAL_COMMUNITY): Payer: Self-pay | Attending: Cardiovascular Disease

## 2017-02-09 ENCOUNTER — Other Ambulatory Visit: Payer: Self-pay

## 2017-02-09 ENCOUNTER — Other Ambulatory Visit: Payer: Self-pay | Admitting: Internal Medicine

## 2017-02-09 DIAGNOSIS — I5022 Chronic systolic (congestive) heart failure: Secondary | ICD-10-CM

## 2017-02-09 DIAGNOSIS — I422 Other hypertrophic cardiomyopathy: Secondary | ICD-10-CM | POA: Insufficient documentation

## 2017-02-27 DEATH — deceased

## 2017-03-14 IMAGING — CR DG CHEST 2V
2 series · 2 of 2 positions shown · non-contrast
Comparison: Chest radiograph dated 12/08/2015

CLINICAL DATA: 55-year-old female with chest pain.

EXAM:
CHEST  2 VIEW

[w chest pa]
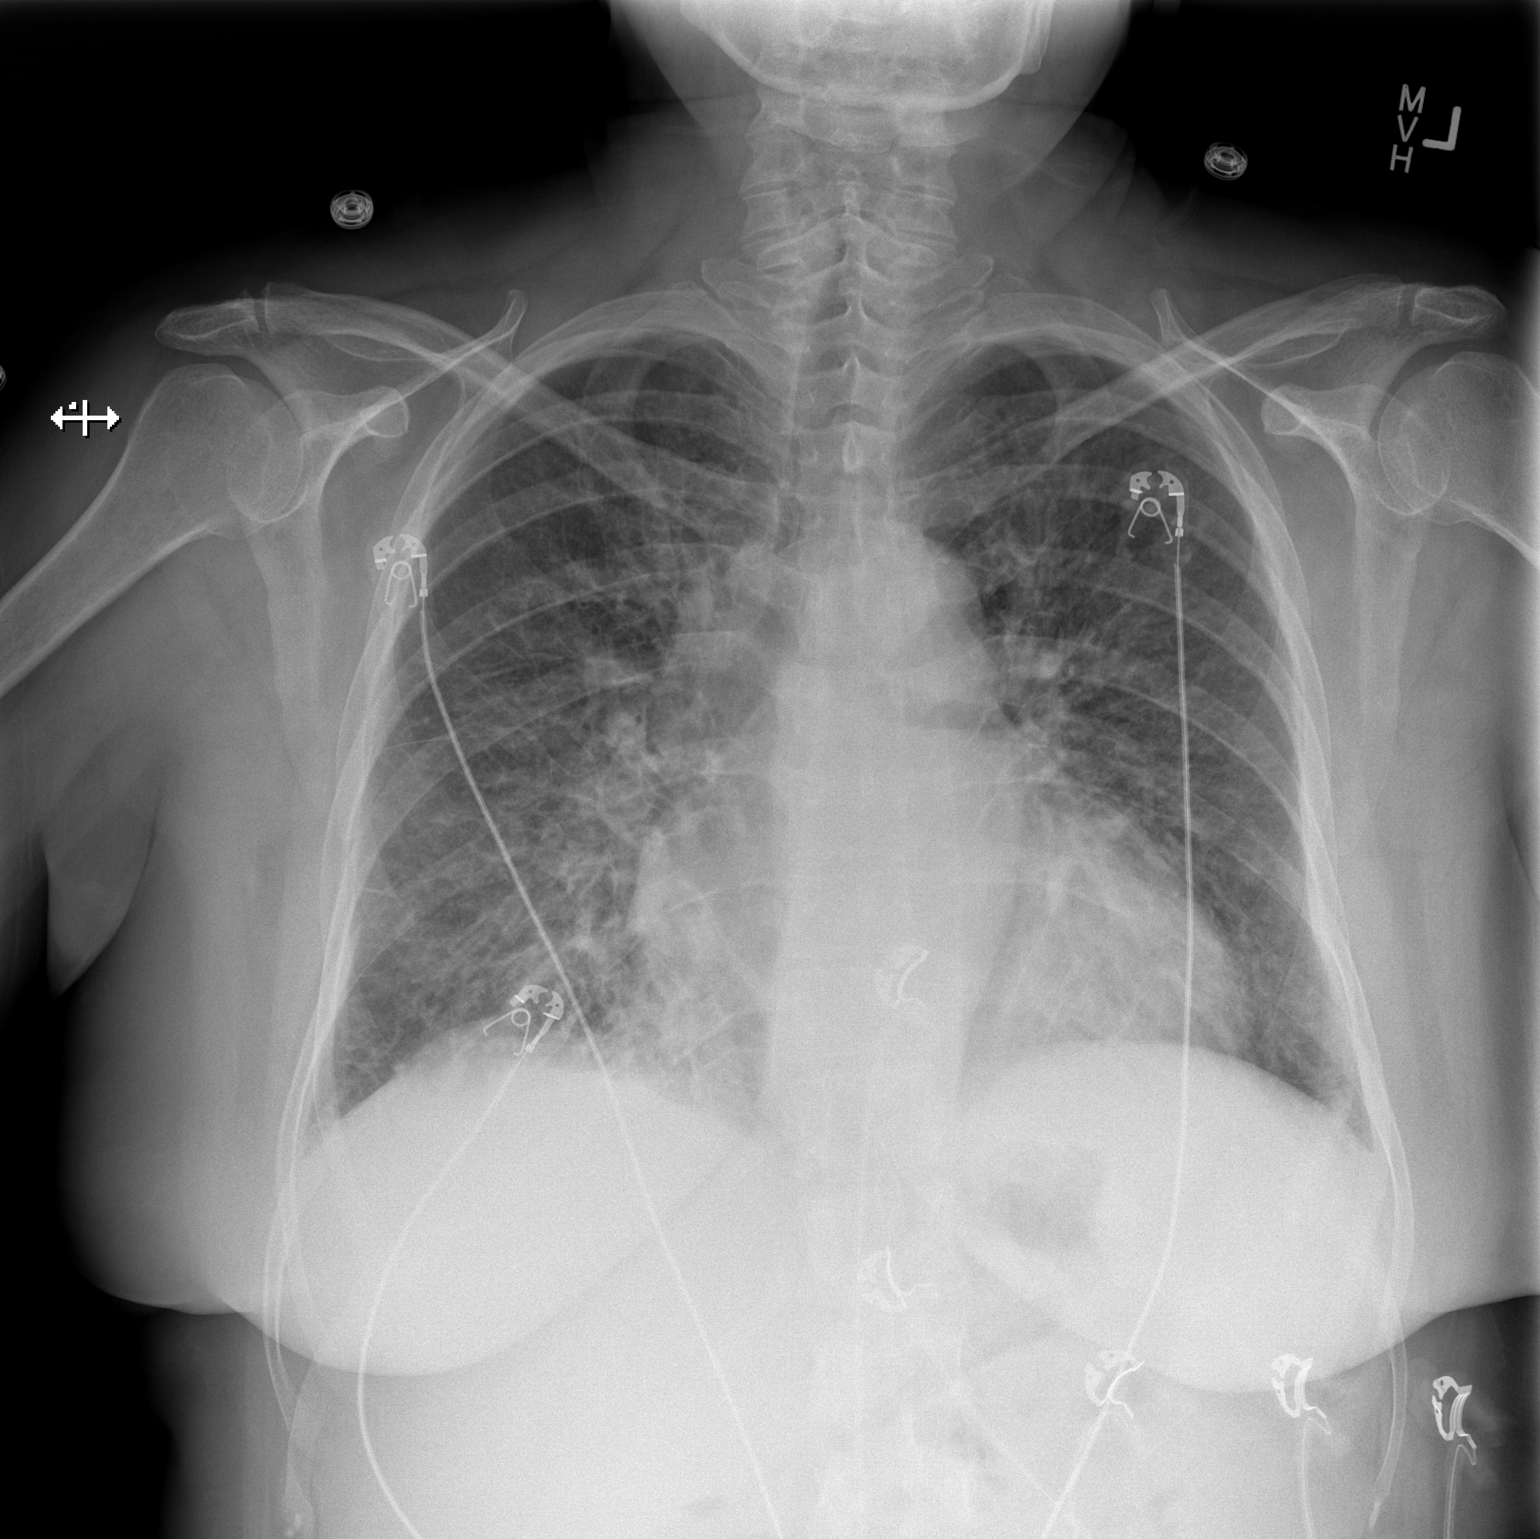

[w chest lat]
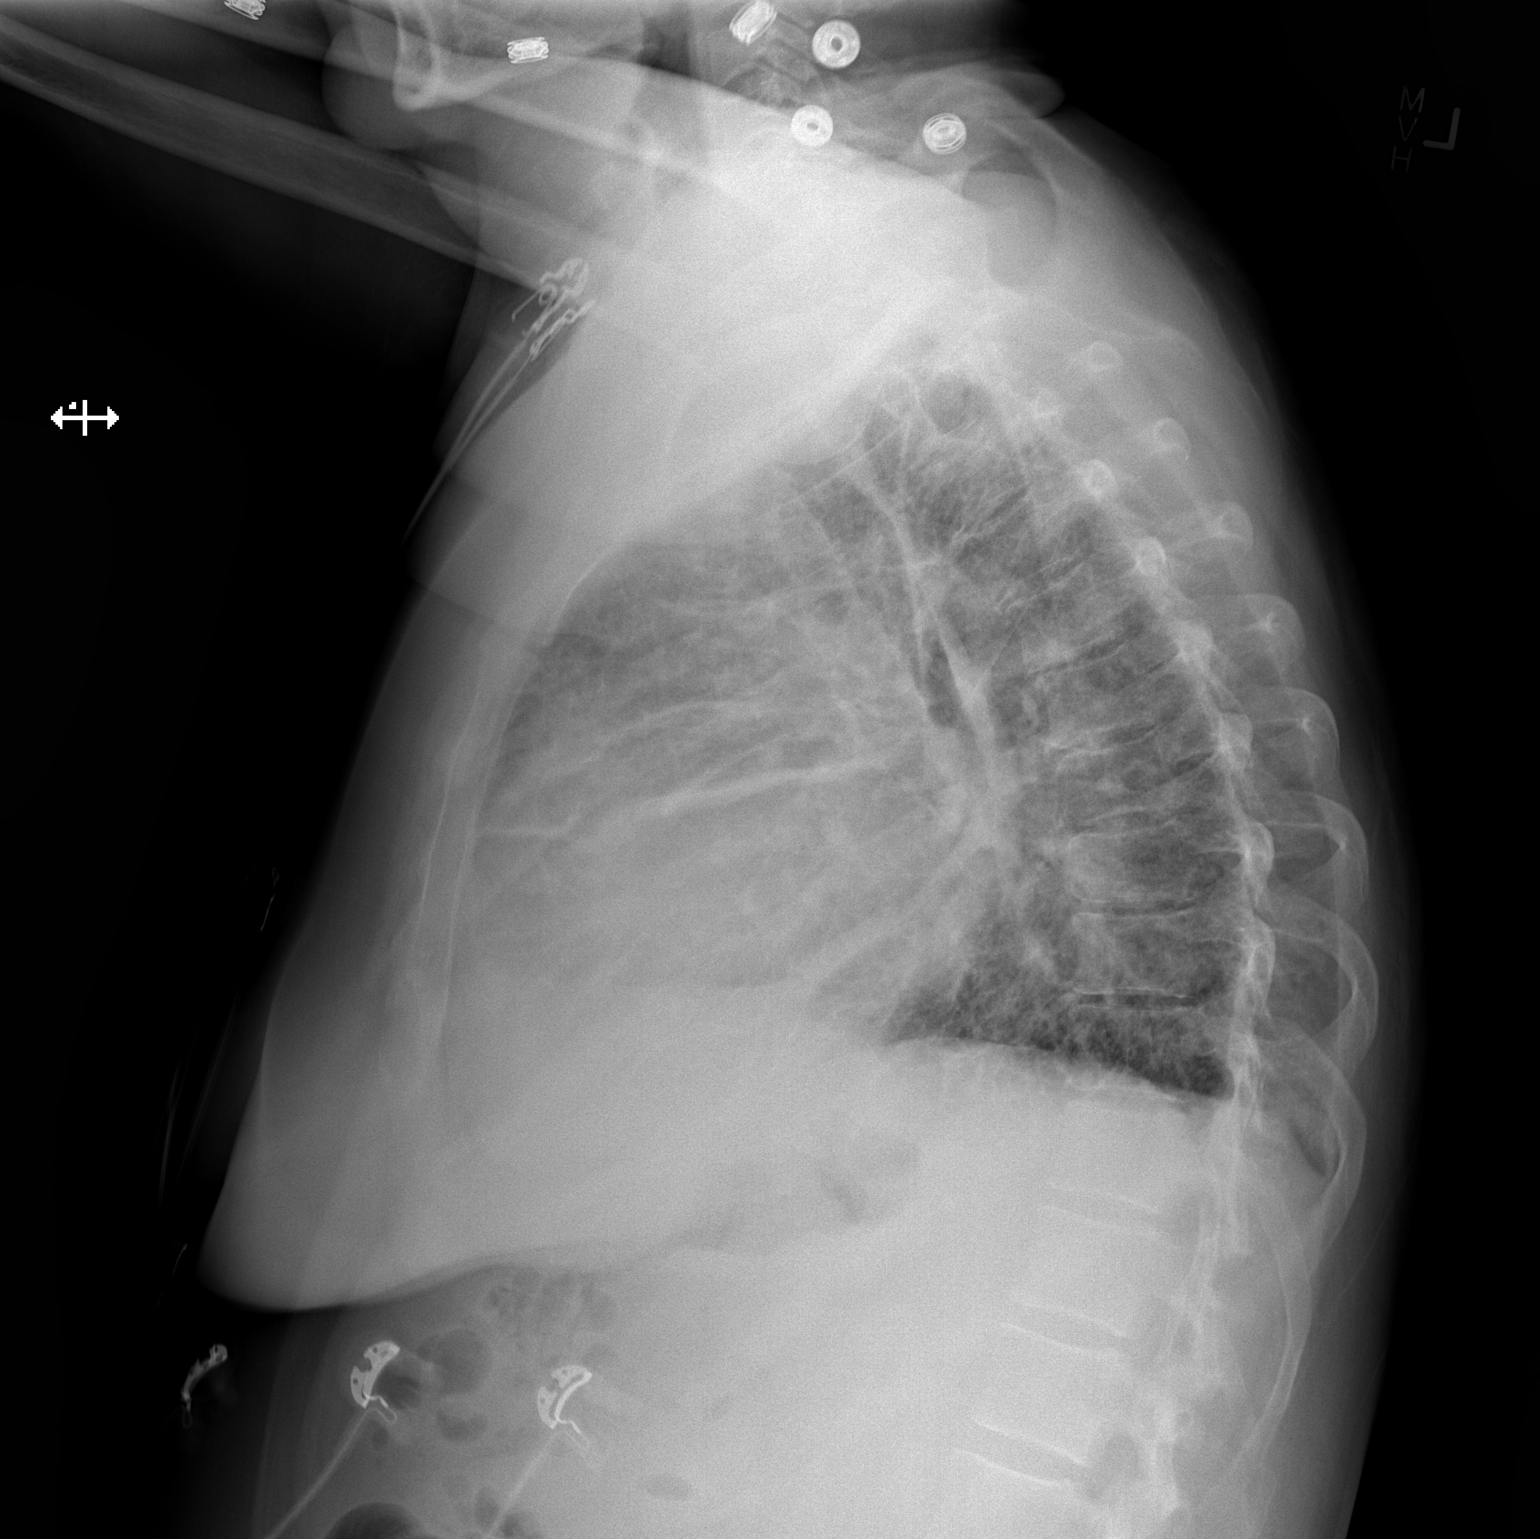

[2 of 2 positions shown; findings below may reference images not displayed]

FINDINGS: Two views of the chest demonstrates diffuse bilateral interstitial
prominence and Kerley B-lines compatible with interstitial edema.
Pneumonia is not excluded. Clinical correlation is recommended.
There is no focal consolidation, pleural effusion, or pneumothorax.
Top-normal cardiac size. No acute osseous pathology.
IMPRESSION: Mild interstitial edema, new from prior study. Pneumonia is not
excluded. Clinical correlation is recommended.

## 2017-03-23 ENCOUNTER — Encounter: Payer: Self-pay | Admitting: *Deleted

## 2017-04-13 ENCOUNTER — Ambulatory Visit: Payer: Self-pay | Admitting: Oncology

## 2017-04-13 ENCOUNTER — Other Ambulatory Visit: Payer: Self-pay

## 2017-08-12 IMAGING — DX DG CHEST 2V
2 series · 2 of 2 positions shown · non-contrast
Comparison: PA and lateral chest x-ray October 05, 2016

CLINICAL DATA: Shortness of breath, chest tightness. History of
CHF, cardiomyopathy.

EXAM:
CHEST  2 VIEW

[chest pa]
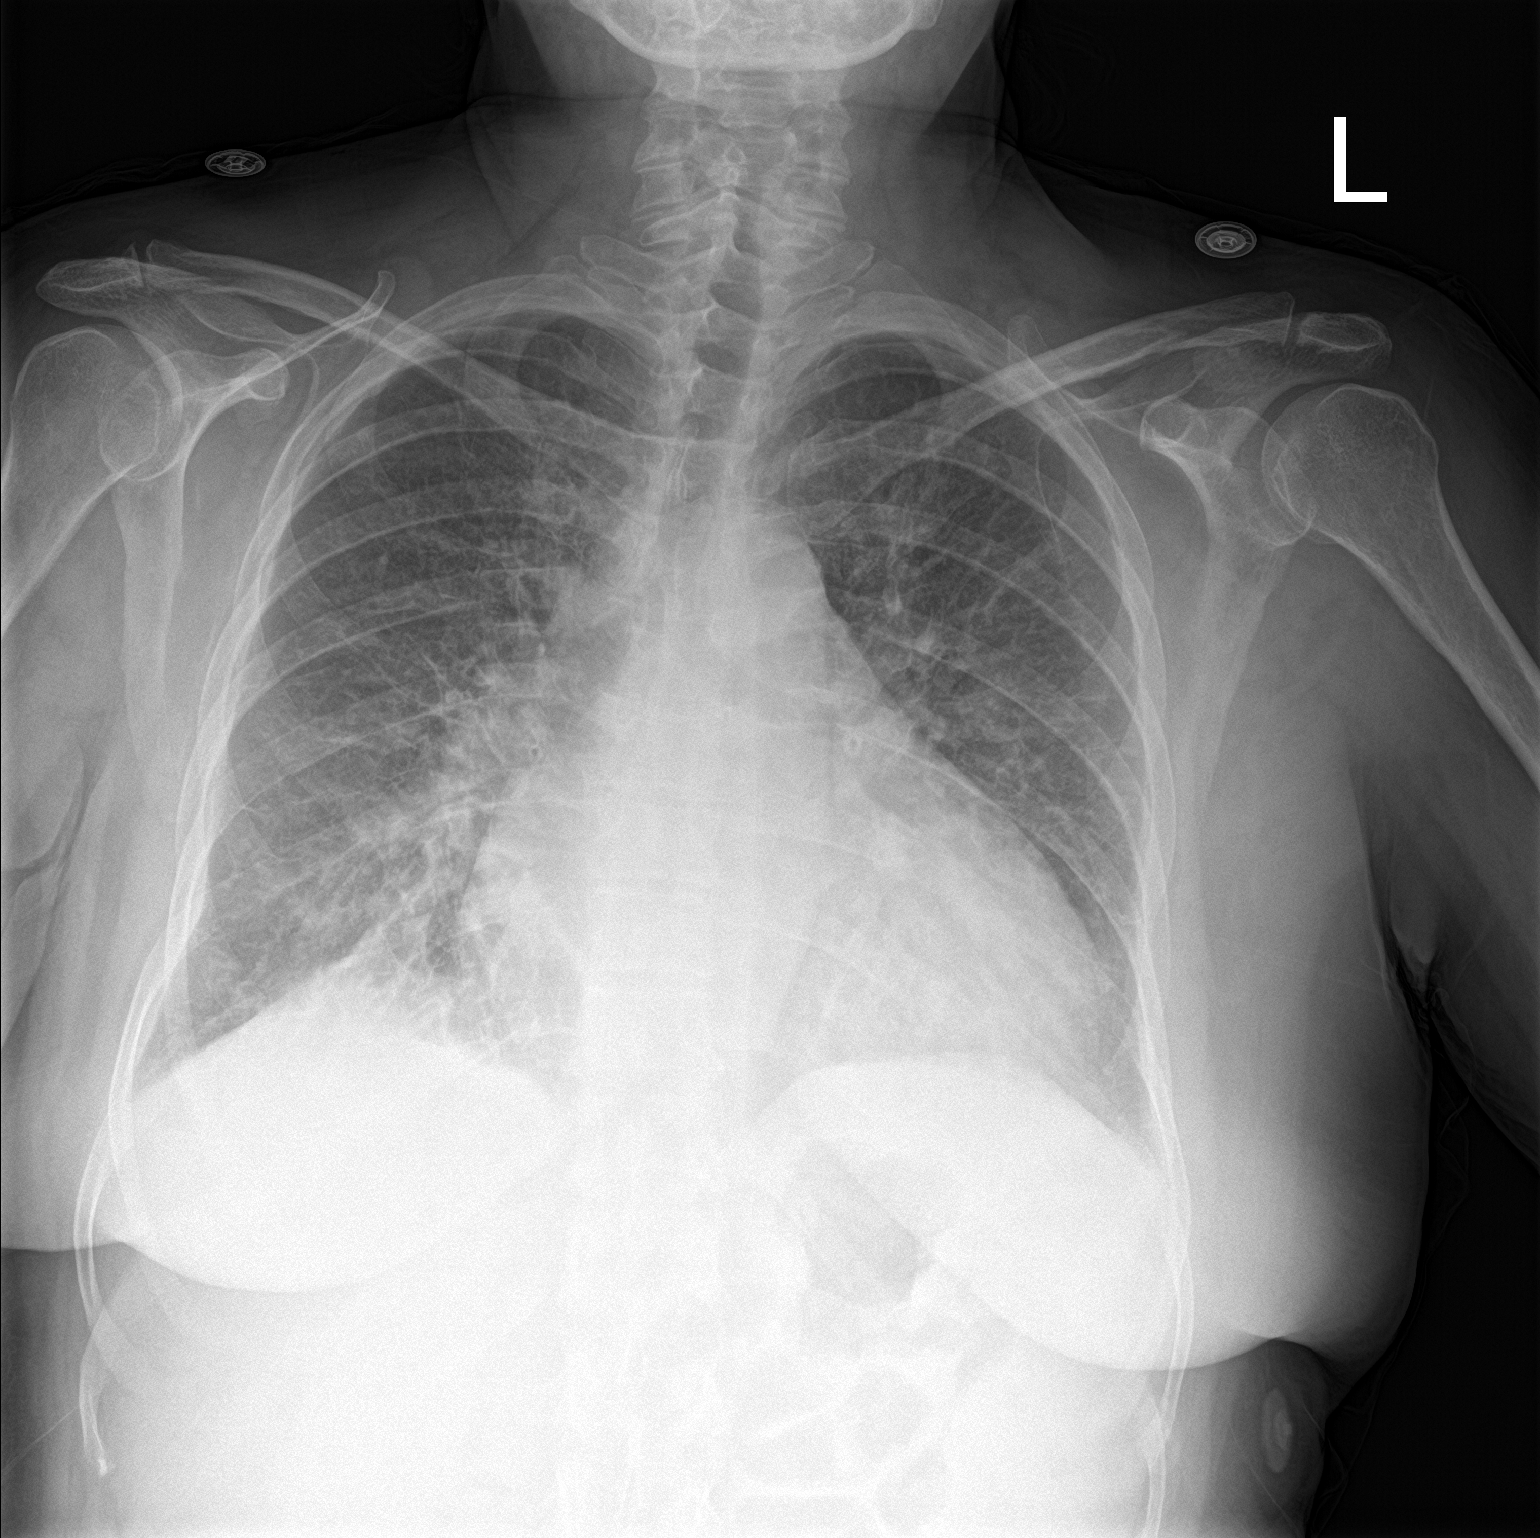

[chest lat]
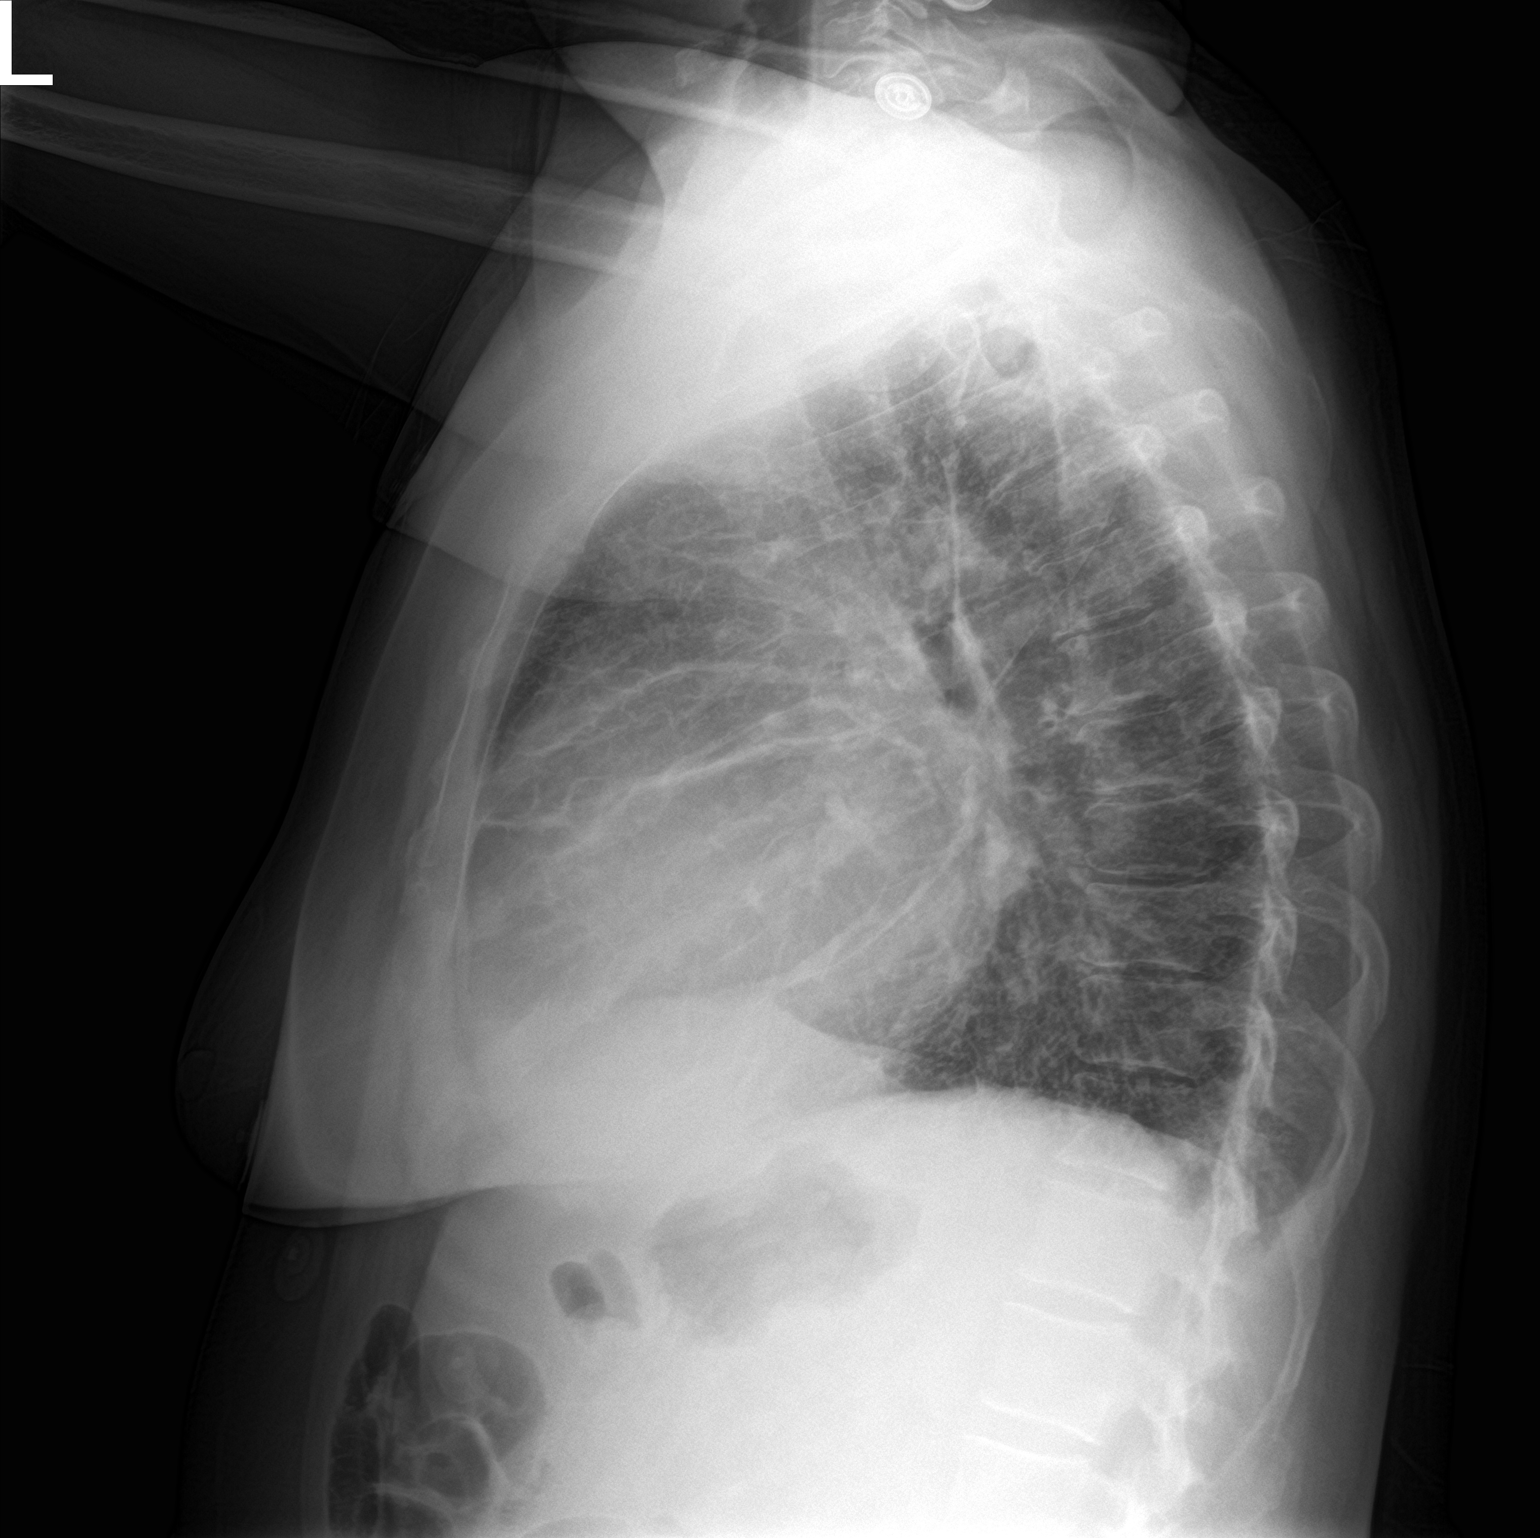

[2 of 2 positions shown; findings below may reference images not displayed]

FINDINGS: The lungs are adequately inflated. The interstitial markings are
chronically increased. There is confluent density in the right
infrahilar region likely in the middle lobe. There is no significant
pleural effusion. The cardiac silhouette remains enlarged. The
pulmonary vascularity is mildly prominent centrally. The trachea is
midline. The bony thorax exhibits no acute abnormality.
IMPRESSION: Slightly increased prominence of the pulmonary interstitium and
pulmonary vascularity consistent low-grade acute CHF superimposed on
chronic CHF. Atelectasis anteriorly in the right middle lobe is
suspected.

## 2017-10-19 ENCOUNTER — Encounter (HOSPITAL_COMMUNITY): Payer: Self-pay

## 2017-12-21 IMAGING — US US ABDOMEN LIMITED
1 series · 14 of 25 positions shown · non-contrast
Comparison: CT 06/04/2015

CLINICAL DATA: Abdominal pain 1 day.  History of CLL.

EXAM:
US ABDOMEN LIMITED - RIGHT UPPER QUADRANT

[Series 1: us abdomen limited · 0.23mm/px · 14 of 43 slices shown]
[im 1/43]
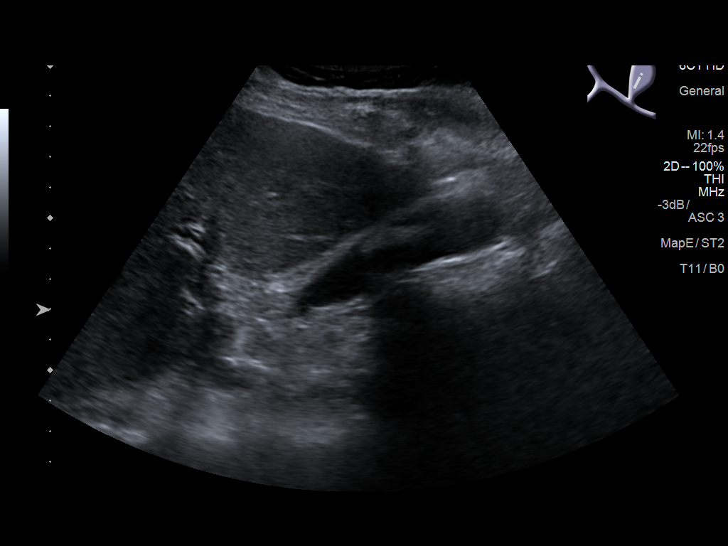
[im 4/43]
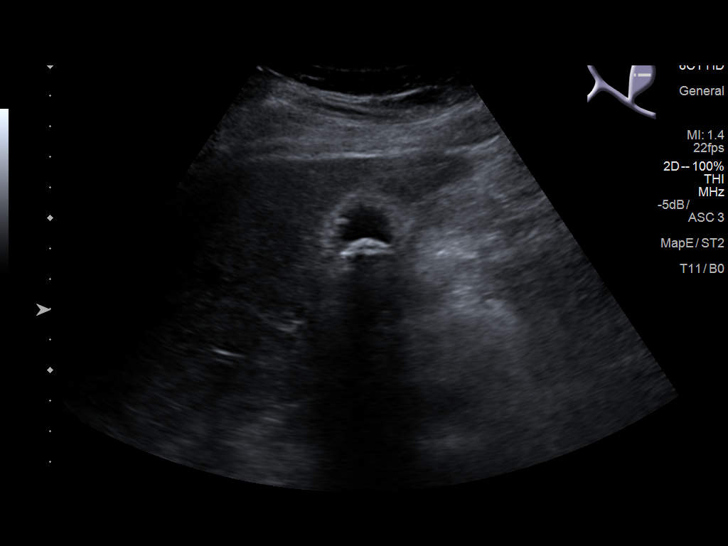
[im 8/43]
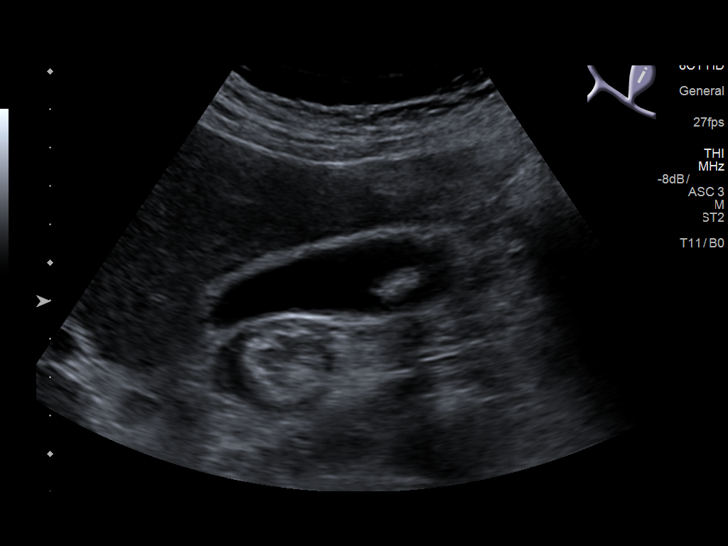
[im 11/43]
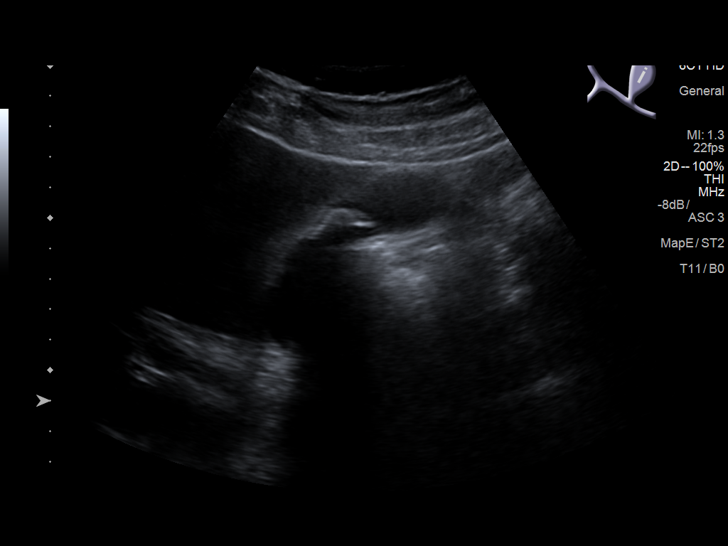
[im 15/43]
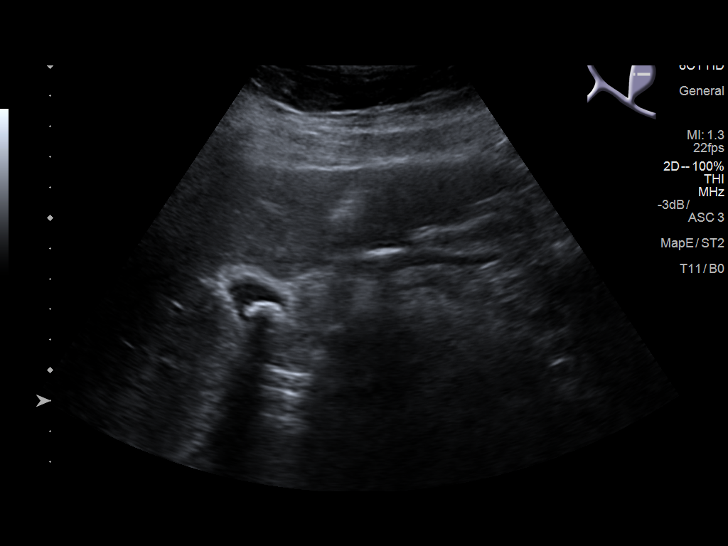
[im 16/43]
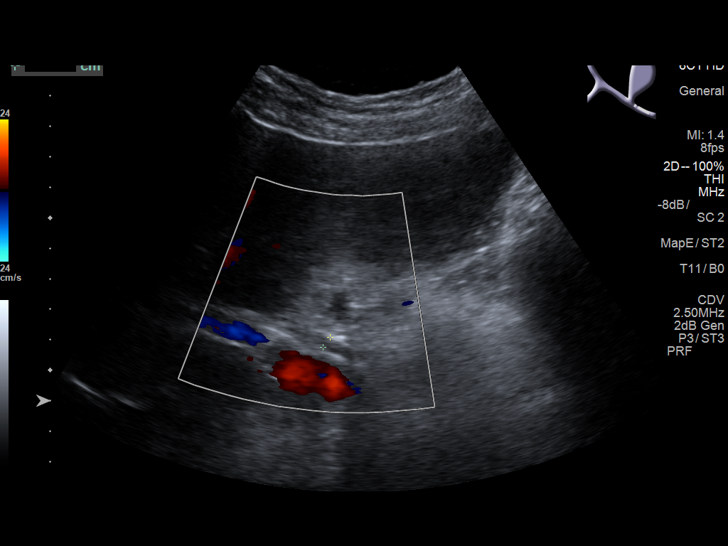
[im 20/43]
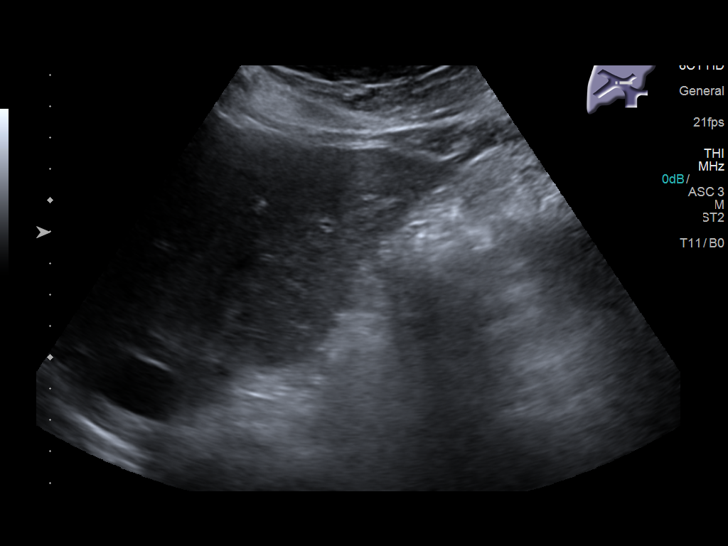
[im 23/43]
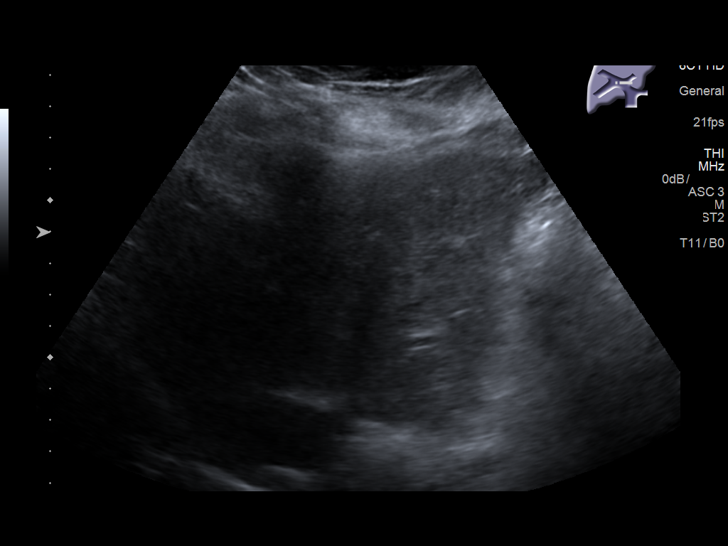
[im 27/43]
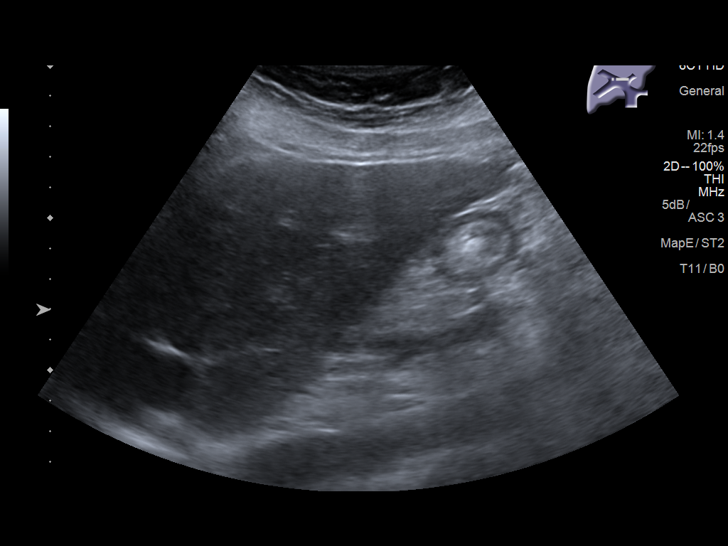
[im 29/43]
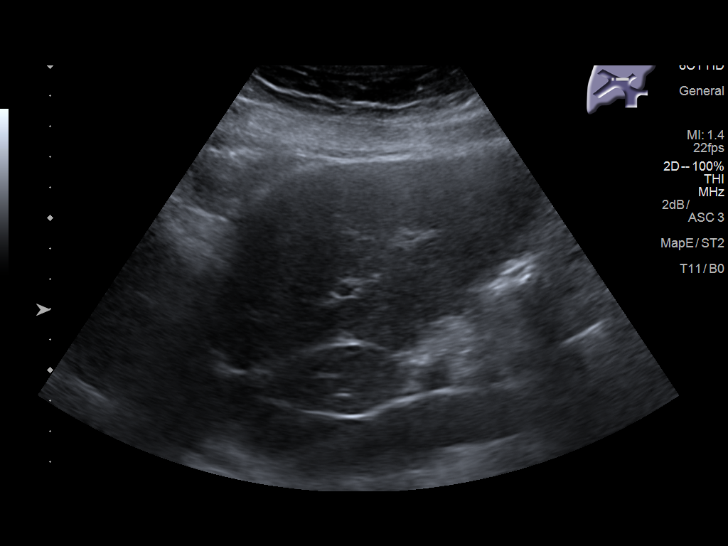
[im 32/43]
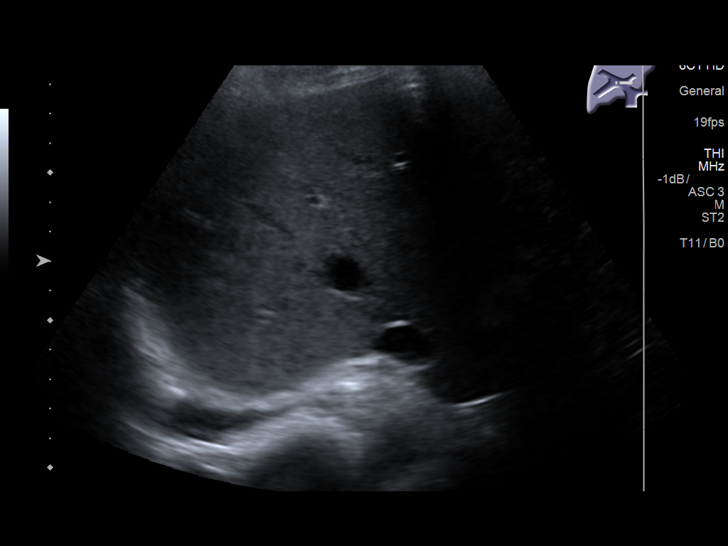
[im 36/43]
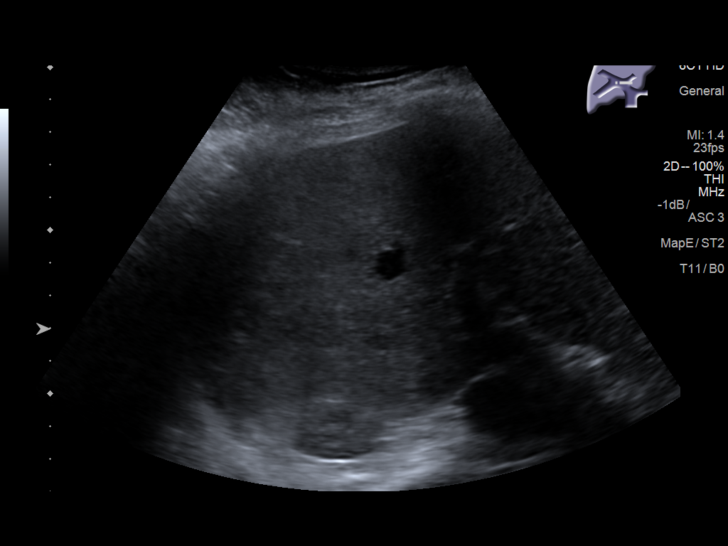
[im 39/43]
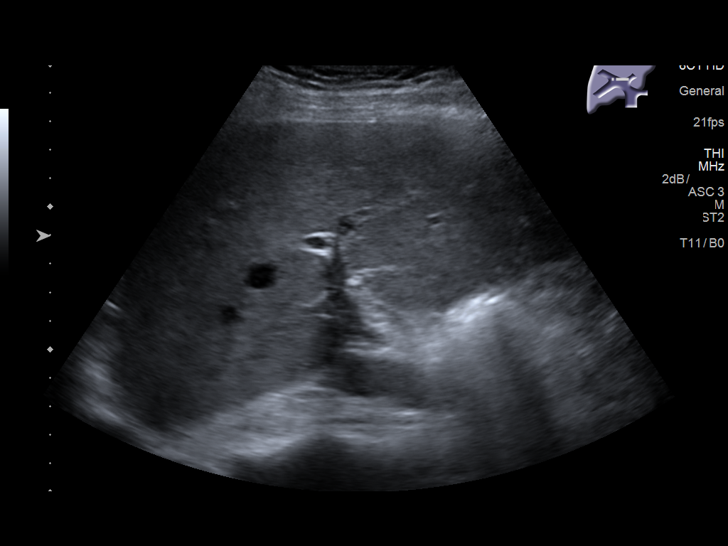
[im 43/43]
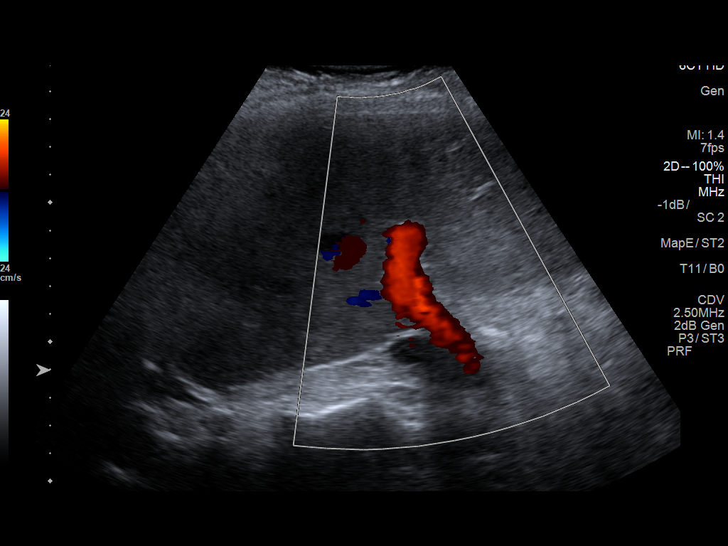

[14 of 25 positions shown; findings below may reference images not displayed]

FINDINGS: Gallbladder:

Single 2.2 cm gallstone is present. No significant gallbladder wall
thickening. There are a few punctate echogenic foci within the
gallbladder wall likely due to adenomyomatosis/cholesterolosis.
Negative sonographic Murphy's sign. No adjacent free fluid.

Common bile duct:

Diameter: 3.5 mm.

Liver:

No focal lesion identified. Within normal limits in parenchymal
echogenicity. Normal directional flow within the portal vein.

Small right pleural effusion.
IMPRESSION: Single 2.2 cm gallstone. No additional sonographic evidence to
suggest cholecystitis.

Tiny right pleural effusion.

## 2022-12-04 ENCOUNTER — Other Ambulatory Visit: Payer: Self-pay
